# Patient Record
Sex: Male | Born: 1940
Health system: Southern US, Community
[De-identification: ages and names within clinical notes are randomized; demographics above are authoritative.]

## PROBLEM LIST (undated history)

## (undated) DIAGNOSIS — H9193 Unspecified hearing loss, bilateral: Secondary | ICD-10-CM

## (undated) DIAGNOSIS — I10 Essential (primary) hypertension: Secondary | ICD-10-CM

## (undated) HISTORY — PX: APPENDECTOMY: SHX54

## (undated) HISTORY — PX: CATARACT EXTRACTION, BILATERAL: SHX1313

## (undated) HISTORY — PX: VASECTOMY: SHX75

---

## 2008-07-10 ENCOUNTER — Encounter (INDEPENDENT_AMBULATORY_CARE_PROVIDER_SITE_OTHER): Payer: Self-pay | Admitting: Surgery

## 2008-07-10 ENCOUNTER — Encounter: Admission: RE | Admit: 2008-07-10 | Discharge: 2008-07-10 | Payer: Self-pay | Admitting: Internal Medicine

## 2008-07-10 ENCOUNTER — Ambulatory Visit (HOSPITAL_COMMUNITY): Admission: RE | Admit: 2008-07-10 | Discharge: 2008-07-12 | Payer: Self-pay | Admitting: Surgery

## 2010-08-12 LAB — DIFFERENTIAL
Basophils Absolute: 0 10*3/uL (ref 0.0–0.1)
Basophils Relative: 0 % (ref 0–1)
Eosinophils Absolute: 0 10*3/uL (ref 0.0–0.7)
Neutrophils Relative %: 87 % — ABNORMAL HIGH (ref 43–77)

## 2010-08-12 LAB — CBC
Hemoglobin: 15.2 g/dL (ref 13.0–17.0)
MCHC: 34.1 g/dL (ref 30.0–36.0)
MCHC: 34.3 g/dL (ref 30.0–36.0)
MCV: 91.1 fL (ref 78.0–100.0)
MCV: 91.9 fL (ref 78.0–100.0)
Platelets: 140 10*3/uL — ABNORMAL LOW (ref 150–400)
RBC: 4.91 MIL/uL (ref 4.22–5.81)

## 2010-08-12 LAB — BASIC METABOLIC PANEL
CO2: 27 mEq/L (ref 19–32)
Chloride: 99 mEq/L (ref 96–112)
Creatinine, Ser: 1.2 mg/dL (ref 0.4–1.5)
GFR calc Af Amer: >60 mL/min (ref >60)
Glucose, Bld: 152 mg/dL — ABNORMAL HIGH (ref 70–99)
Sodium: 133 mEq/L — ABNORMAL LOW (ref 135–145)

## 2010-09-14 NOTE — Op Note (Signed)
NAMEJAXN, Peter Stone            ACCOUNT NO.:  [id number]   MEDICAL RECORD NO.:  [id number]          PATIENT TYPE:  AMB   LOCATION:  DAY                          FACILITY:  [facility]   PHYSICIAN:  Sandria Bales. Ezzard Standing, M.D.  DATE OF BIRTH:  1940-06-03   DATE OF PROCEDURE:  DATE OF DISCHARGE:                               OPERATIVE REPORT   Date of admission ?   HISTORY OF ILLNESS:  Peter Stone is a 70 year old white male patient of  Dr. Theressa Millard who started hurting last evening about 5 p.m.  He had  increasing abdominal pain which seemed to localize in the right lower  quadrant.  He saw Dr. Earl Gala today who obtained a CT scan at [facility] and the CT scan is consistent with acute appendicitis.   He denies a history of peptic ulcer disease, liver disease, gallbladder  disease, colon disease, or any prior abdominal surgery.   PAST MEDICAL HISTORY:  He has no allergies.   MEDICATIONS:  1. Hytrin for blood pressure (generic).  2. Potassium.  3. Aspirin a day.   REVIEW OF SYSTEMS:  NEUROLOGIC:  No seizure or loss of consciousness.  PULMONARY:  No pneumonia or tuberculosis.  CARDIAC:  He has hypertension for about 20 years, but no heart attack,  chest pain,  or cardiac  evaluation.  GASTROINTESTINAL:  See history of present illness.  Of note, his mother  did have appendicitis many years ago.  UROLOGIC:  No kidney stones or kidney infections.  MUSCULOSKELETAL:  I think he has like a trick knee but otherwise had no  significant musculoskeletal problems.   SOCIAL HISTORY:  His wife is in the room with him.  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   PHYSICAL EXAMINATION:  VITAL SIGNS:  His temperature is 97.5, pulse 88,  and blood pressure 135/72.  HEENT:  Unremarkable.  NECK:  Supple.  There are no masses or thyromegaly.  LYMPH NODES:  He has no cervical or supraclavicular adenopathy.  LUNGS:  Clear to auscultation with symmetric breath  sounds.  HEART:  Regular rate and rhythm without murmur or rub.  ABDOMEN:  He is tender and guarding at his right lower quadrant.  I feel  no mass, no hernia, or organomegaly.  EXTREMITIES:  He has good strength in upper and lower extremities.  NEUROLOGIC:  Grossly intact.   LABORATORY DATA:  Labs that I have show a sodium 133, potassium 3.4,  chloride of 99, CO2 of 27, glucose of 152, creatinine is 1.2.  His white  blood count 16,500, hemoglobin 15, hematocrit 44, and platelet count  173,000.  His EKG was unremarkable.  I reviewed his CT scan which showed a dull, enlarged appendix in the  right lower quadrant, retrocecal behind the ileum.   IMPRESSION:  1. Acute appendicitis.  I discussed with the patient and wife about      proceeding with appendectomy.  I am covering for Dr. Michaell Cowing.  It is      unclear at this time whether it will be me or Dr. Michaell Cowing  will be the      surgeon.  I discussed with him the potential indications and      potential complications of an appendectomy which include, but are      not limited to, bleeding, infection, and  open surgery.  We will      try to do it laparoscopically and the diagnosis could be something      of appendicitis.   His hospitalization will depend on the severity of the appendicitis and  how well the surgery goes.  1. Hypertension.      Sandria Bales. Ezzard Standing, M.D.  Electronically Signed     DHN/MEDQ  D:  07/10/2008  T:  07/11/2008  Job:  [id number]   cc:   Theressa Millard, M.D.

## 2010-09-14 NOTE — Op Note (Signed)
NAMECREIGHTON, LONGLEY NO.:  [id number]   MEDICAL RECORD NO.:  [id number]          PATIENT TYPE:  OIB   LOCATION:  0098                         FACILITY:  [facility]   PHYSICIAN:  Sandria Bales. Ezzard Standing, M.D.  DATE OF BIRTH:  [birth year]   DATE OF PROCEDURE:  07/10/2008  DATE OF DISCHARGE:                               OPERATIVE REPORT   Date of Surgery - 10 July 2008   PREOPERATIVE DIAGNOSIS:  Appendicitis.   POSTOPERATIVE DIAGNOSIS:  Ruptured appendicitis.   PROCEDURE:  Laparoscopic appendectomy.   SURGEON:  Sandria Bales. Ezzard Standing, M.D.   FIRST ASSISTANT:  None.   ANESTHESIA:  General endotracheal with approximately 20 mL of 0.25%  Marcaine.   COMPLICATIONS:  None.   INDICATIONS FOR PROCEDURE:  Mr. Urbas is a 70 year old white male  patient of Dr. Benjaman Kindler, who developed abdominal pain, starting about  5:00 p.m. last night.  He saw Dr. Earl Gala today in the office, who  obtained a CT scan that showed changes in the appendix consistent with  acute appendicitis.   I discussed with the patient about proceeding with surgery for his  appendicitis.  I discussed both the indications and potential  complications.  Potential complications include, but not limited to,  bleeding, infection, bowel injury and open surgery.   OPERATIVE NOTE:  The patient placed in supine position.  His left arm  tucked to his side, his right arm was out and a Foley catheter in place.  His abdomen was shaved, sterilely draped and a time-out was held  identifying the patient and procedure.   I went into his abdominal cavity through an infraumbilical incision.  Sharp dissection carried down to the abdominal cavity.  A 0 degrees 10  mm laparoscope was inserted through a 12 mm Hasson trocar and Hasson  trocar secured with a 0 Vicryl suture.  A 5 mm trocar was placed the  right upper quadrant.  A 11-mm trocar was placed in the left lower  quadrant.  Abdominal exploration carried out, right and  left lobes of  liver unremarkable.  The bowel was covered in part by omentum but I  could see purulence or pus in his pelvis.  This was consistent with  ruptured appendicitis.   I found his appendix curled along his right pelvic brim.  It had  obviously perforated and he had contamination in the pelvis.  I was able  to the appendix up.  I took the mesentery down with the Harmonic scalpel  down to the base of appendix and then divided the base of the appendix  with a vascular load of the Ethicon 45 mm Endo-GIA stapler.   I then put the appendix in EndoCatch bag and delivered it through the  umbilicus.  I irrigated his abdomen out with saline.  I used about 2  liters of saline.  I again tried to irrigate a lot out of his right  pelvic brim and pelvis.   I then I removed the trocars in turn.  I closed umbilical port with a 0  Vicryl suture.  I closed the skin each port with 5-0  Monocryl suture.  I  used Dermabond at each wound and sterilely dressed it.  The patient  tolerated procedure well.  Sponge and needle count were correct at the  end of the case.      Sandria Bales. Ezzard Standing, M.D.  Electronically Signed     DHN/MEDQ  D:  07/10/2008  T:  07/11/2008  Job:  [id number]   cc:   Theressa Millard, M.D.  Fax: [phone]

## 2011-05-12 DIAGNOSIS — H33309 Unspecified retinal break, unspecified eye: Secondary | ICD-10-CM | POA: Diagnosis not present

## 2011-06-27 DIAGNOSIS — Z1331 Encounter for screening for depression: Secondary | ICD-10-CM | POA: Diagnosis not present

## 2011-06-27 DIAGNOSIS — I10 Essential (primary) hypertension: Secondary | ICD-10-CM | POA: Diagnosis not present

## 2011-06-27 DIAGNOSIS — Z0000 Encounter for general adult medical examination without abnormal findings: Secondary | ICD-10-CM | POA: Diagnosis not present

## 2011-09-23 DIAGNOSIS — Z4800 Encounter for change or removal of nonsurgical wound dressing: Secondary | ICD-10-CM | POA: Diagnosis not present

## 2011-10-12 DIAGNOSIS — H33059 Total retinal detachment, unspecified eye: Secondary | ICD-10-CM | POA: Diagnosis not present

## 2011-10-12 DIAGNOSIS — H01009 Unspecified blepharitis unspecified eye, unspecified eyelid: Secondary | ICD-10-CM | POA: Diagnosis not present

## 2011-12-26 DIAGNOSIS — I10 Essential (primary) hypertension: Secondary | ICD-10-CM | POA: Diagnosis not present

## 2012-03-28 DIAGNOSIS — D485 Neoplasm of uncertain behavior of skin: Secondary | ICD-10-CM | POA: Diagnosis not present

## 2012-04-11 DIAGNOSIS — H33059 Total retinal detachment, unspecified eye: Secondary | ICD-10-CM | POA: Diagnosis not present

## 2012-04-11 DIAGNOSIS — H5210 Myopia, unspecified eye: Secondary | ICD-10-CM | POA: Diagnosis not present

## 2012-04-11 DIAGNOSIS — Z961 Presence of intraocular lens: Secondary | ICD-10-CM | POA: Diagnosis not present

## 2012-04-11 DIAGNOSIS — H52209 Unspecified astigmatism, unspecified eye: Secondary | ICD-10-CM | POA: Diagnosis not present

## 2012-05-08 DIAGNOSIS — Z23 Encounter for immunization: Secondary | ICD-10-CM | POA: Diagnosis not present

## 2012-06-28 DIAGNOSIS — I10 Essential (primary) hypertension: Secondary | ICD-10-CM | POA: Diagnosis not present

## 2012-06-28 DIAGNOSIS — E7800 Pure hypercholesterolemia, unspecified: Secondary | ICD-10-CM | POA: Diagnosis not present

## 2012-12-05 DIAGNOSIS — I10 Essential (primary) hypertension: Secondary | ICD-10-CM | POA: Diagnosis not present

## 2012-12-05 DIAGNOSIS — E7800 Pure hypercholesterolemia, unspecified: Secondary | ICD-10-CM | POA: Diagnosis not present

## 2012-12-18 DIAGNOSIS — R51 Headache: Secondary | ICD-10-CM | POA: Diagnosis not present

## 2012-12-18 DIAGNOSIS — R634 Abnormal weight loss: Secondary | ICD-10-CM | POA: Diagnosis not present

## 2013-01-16 DIAGNOSIS — H905 Unspecified sensorineural hearing loss: Secondary | ICD-10-CM | POA: Diagnosis not present

## 2013-01-16 DIAGNOSIS — H6120 Impacted cerumen, unspecified ear: Secondary | ICD-10-CM | POA: Diagnosis not present

## 2013-02-22 DIAGNOSIS — Z23 Encounter for immunization: Secondary | ICD-10-CM | POA: Diagnosis not present

## 2013-04-16 DIAGNOSIS — H33059 Total retinal detachment, unspecified eye: Secondary | ICD-10-CM | POA: Diagnosis not present

## 2013-04-16 DIAGNOSIS — Z961 Presence of intraocular lens: Secondary | ICD-10-CM | POA: Diagnosis not present

## 2013-04-16 DIAGNOSIS — H52209 Unspecified astigmatism, unspecified eye: Secondary | ICD-10-CM | POA: Diagnosis not present

## 2013-06-11 DIAGNOSIS — Z23 Encounter for immunization: Secondary | ICD-10-CM | POA: Diagnosis not present

## 2013-06-11 DIAGNOSIS — I10 Essential (primary) hypertension: Secondary | ICD-10-CM | POA: Diagnosis not present

## 2013-12-16 DIAGNOSIS — E7800 Pure hypercholesterolemia, unspecified: Secondary | ICD-10-CM | POA: Diagnosis not present

## 2013-12-16 DIAGNOSIS — I10 Essential (primary) hypertension: Secondary | ICD-10-CM | POA: Diagnosis not present

## 2013-12-16 DIAGNOSIS — N529 Male erectile dysfunction, unspecified: Secondary | ICD-10-CM | POA: Diagnosis not present

## 2014-02-11 DIAGNOSIS — Z23 Encounter for immunization: Secondary | ICD-10-CM | POA: Diagnosis not present

## 2014-04-22 DIAGNOSIS — Z961 Presence of intraocular lens: Secondary | ICD-10-CM | POA: Diagnosis not present

## 2014-04-22 DIAGNOSIS — H33051 Total retinal detachment, right eye: Secondary | ICD-10-CM | POA: Diagnosis not present

## 2014-04-22 DIAGNOSIS — H52203 Unspecified astigmatism, bilateral: Secondary | ICD-10-CM | POA: Diagnosis not present

## 2014-06-24 DIAGNOSIS — I10 Essential (primary) hypertension: Secondary | ICD-10-CM | POA: Diagnosis not present

## 2014-06-24 DIAGNOSIS — E780 Pure hypercholesterolemia: Secondary | ICD-10-CM | POA: Diagnosis not present

## 2014-06-24 DIAGNOSIS — N529 Male erectile dysfunction, unspecified: Secondary | ICD-10-CM | POA: Diagnosis not present

## 2014-12-23 ENCOUNTER — Other Ambulatory Visit: Payer: Self-pay | Admitting: Internal Medicine

## 2015-01-08 DIAGNOSIS — E780 Pure hypercholesterolemia: Secondary | ICD-10-CM | POA: Diagnosis not present

## 2015-01-08 DIAGNOSIS — I10 Essential (primary) hypertension: Secondary | ICD-10-CM | POA: Diagnosis not present

## 2015-01-08 DIAGNOSIS — Z7189 Other specified counseling: Secondary | ICD-10-CM | POA: Diagnosis not present

## 2015-01-08 DIAGNOSIS — Z0000 Encounter for general adult medical examination without abnormal findings: Secondary | ICD-10-CM | POA: Diagnosis not present

## 2015-01-08 DIAGNOSIS — Z1389 Encounter for screening for other disorder: Secondary | ICD-10-CM | POA: Diagnosis not present

## 2015-01-08 DIAGNOSIS — Z79899 Other long term (current) drug therapy: Secondary | ICD-10-CM | POA: Diagnosis not present

## 2015-02-02 DIAGNOSIS — M79602 Pain in left arm: Secondary | ICD-10-CM | POA: Diagnosis not present

## 2015-02-17 DIAGNOSIS — Z23 Encounter for immunization: Secondary | ICD-10-CM | POA: Diagnosis not present

## 2015-02-17 DIAGNOSIS — R109 Unspecified abdominal pain: Secondary | ICD-10-CM | POA: Diagnosis not present

## 2015-02-17 DIAGNOSIS — M25529 Pain in unspecified elbow: Secondary | ICD-10-CM | POA: Diagnosis not present

## 2015-02-23 ENCOUNTER — Ambulatory Visit
Admission: RE | Admit: 2015-02-23 | Discharge: 2015-02-23 | Disposition: A | Discharge status: Home / Self Care | Payer: Medicare Other | Source: Ambulatory Visit | Attending: Internal Medicine | Admitting: Internal Medicine

## 2015-02-23 ENCOUNTER — Other Ambulatory Visit: Payer: Self-pay | Admitting: Internal Medicine

## 2015-02-23 DIAGNOSIS — M542 Cervicalgia: Secondary | ICD-10-CM | POA: Diagnosis not present

## 2015-02-23 DIAGNOSIS — M79602 Pain in left arm: Secondary | ICD-10-CM | POA: Diagnosis not present

## 2015-02-23 DIAGNOSIS — M5030 Other cervical disc degeneration, unspecified cervical region: Secondary | ICD-10-CM | POA: Diagnosis not present

## 2015-02-26 DIAGNOSIS — M79602 Pain in left arm: Secondary | ICD-10-CM | POA: Diagnosis not present

## 2015-02-27 ENCOUNTER — Ambulatory Visit
Admission: RE | Admit: 2015-02-27 | Discharge: 2015-02-27 | Disposition: A | Discharge status: Home / Self Care | Payer: Medicare Other | Source: Ambulatory Visit | Attending: Internal Medicine | Admitting: Internal Medicine

## 2015-02-27 ENCOUNTER — Other Ambulatory Visit: Payer: Self-pay | Admitting: Internal Medicine

## 2015-02-27 DIAGNOSIS — R109 Unspecified abdominal pain: Secondary | ICD-10-CM

## 2015-03-06 DIAGNOSIS — R109 Unspecified abdominal pain: Secondary | ICD-10-CM | POA: Diagnosis not present

## 2015-03-13 DIAGNOSIS — H6123 Impacted cerumen, bilateral: Secondary | ICD-10-CM | POA: Diagnosis not present

## 2015-06-08 DIAGNOSIS — Z961 Presence of intraocular lens: Secondary | ICD-10-CM | POA: Diagnosis not present

## 2015-06-08 DIAGNOSIS — H52203 Unspecified astigmatism, bilateral: Secondary | ICD-10-CM | POA: Diagnosis not present

## 2015-07-08 DIAGNOSIS — Z79899 Other long term (current) drug therapy: Secondary | ICD-10-CM | POA: Diagnosis not present

## 2015-07-08 DIAGNOSIS — I10 Essential (primary) hypertension: Secondary | ICD-10-CM | POA: Diagnosis not present

## 2015-12-21 ENCOUNTER — Other Ambulatory Visit: Payer: Self-pay | Admitting: *Deleted

## 2015-12-21 NOTE — Telephone Encounter (Signed)
Opened in error

## 2016-01-13 DIAGNOSIS — E7800 Pure hypercholesterolemia, unspecified: Secondary | ICD-10-CM | POA: Diagnosis not present

## 2016-01-13 DIAGNOSIS — Z79899 Other long term (current) drug therapy: Secondary | ICD-10-CM | POA: Diagnosis not present

## 2016-01-13 DIAGNOSIS — Z23 Encounter for immunization: Secondary | ICD-10-CM | POA: Diagnosis not present

## 2016-01-13 DIAGNOSIS — Z0000 Encounter for general adult medical examination without abnormal findings: Secondary | ICD-10-CM | POA: Diagnosis not present

## 2016-01-13 DIAGNOSIS — I10 Essential (primary) hypertension: Secondary | ICD-10-CM | POA: Diagnosis not present

## 2016-01-13 DIAGNOSIS — Z1389 Encounter for screening for other disorder: Secondary | ICD-10-CM | POA: Diagnosis not present

## 2016-01-21 ENCOUNTER — Other Ambulatory Visit: Payer: Self-pay | Admitting: Gastroenterology

## 2016-03-01 ENCOUNTER — Encounter (HOSPITAL_COMMUNITY): Payer: Self-pay | Admitting: *Deleted

## 2016-03-07 ENCOUNTER — Ambulatory Visit (HOSPITAL_COMMUNITY): Payer: Medicare Other | Admitting: Anesthesiology

## 2016-03-07 ENCOUNTER — Ambulatory Visit (HOSPITAL_COMMUNITY)
Admission: RE | Admit: 2016-03-07 | Discharge: 2016-03-07 | Disposition: A | Discharge status: Home / Self Care | Payer: Medicare Other | Source: Ambulatory Visit | Attending: Gastroenterology | Admitting: Gastroenterology

## 2016-03-07 ENCOUNTER — Encounter (HOSPITAL_COMMUNITY): Payer: Self-pay

## 2016-03-07 ENCOUNTER — Encounter (HOSPITAL_COMMUNITY)
Admission: RE | Disposition: A | Discharge status: Home / Self Care | Payer: Self-pay | Source: Ambulatory Visit | Attending: Gastroenterology

## 2016-03-07 DIAGNOSIS — Z1211 Encounter for screening for malignant neoplasm of colon: Secondary | ICD-10-CM | POA: Insufficient documentation

## 2016-03-07 DIAGNOSIS — D127 Benign neoplasm of rectosigmoid junction: Secondary | ICD-10-CM | POA: Diagnosis not present

## 2016-03-07 DIAGNOSIS — I10 Essential (primary) hypertension: Secondary | ICD-10-CM | POA: Diagnosis not present

## 2016-03-07 DIAGNOSIS — K5730 Diverticulosis of large intestine without perforation or abscess without bleeding: Secondary | ICD-10-CM | POA: Insufficient documentation

## 2016-03-07 DIAGNOSIS — D125 Benign neoplasm of sigmoid colon: Secondary | ICD-10-CM | POA: Insufficient documentation

## 2016-03-07 DIAGNOSIS — Z79899 Other long term (current) drug therapy: Secondary | ICD-10-CM | POA: Diagnosis not present

## 2016-03-07 DIAGNOSIS — E7800 Pure hypercholesterolemia, unspecified: Secondary | ICD-10-CM | POA: Diagnosis not present

## 2016-03-07 HISTORY — DX: Essential (primary) hypertension: I10

## 2016-03-07 HISTORY — DX: Unspecified hearing loss, bilateral: H91.93

## 2016-03-07 HISTORY — PX: COLONOSCOPY WITH PROPOFOL: SHX5780

## 2016-03-07 SURGERY — COLONOSCOPY WITH PROPOFOL
Anesthesia: Monitor Anesthesia Care

## 2016-03-07 MED ORDER — SODIUM CHLORIDE 0.9 % IV SOLN: INTRAVENOUS | Status: DC

## 2016-03-07 MED ORDER — LIDOCAINE 2% (20 MG/ML) 5 ML SYRINGE
INTRAMUSCULAR | Status: AC
  Filled 2016-03-07: qty 5

## 2016-03-07 MED ORDER — LACTATED RINGERS IV SOLN
INTRAVENOUS | Status: DC
  Administered 2016-03-07: 08:00:00 via INTRAVENOUS

## 2016-03-07 MED ORDER — PROPOFOL 500 MG/50ML IV EMUL
INTRAVENOUS | Status: DC | PRN
  Administered 2016-03-07: 100 ug/kg/min via INTRAVENOUS

## 2016-03-07 MED ORDER — PROPOFOL 10 MG/ML IV BOLUS
INTRAVENOUS | Status: DC | PRN
  Administered 2016-03-07: 20 mg via INTRAVENOUS
  Administered 2016-03-07 (×2): 10 mg via INTRAVENOUS
  Administered 2016-03-07: 50 mg via INTRAVENOUS
  Administered 2016-03-07 (×3): 20 mg via INTRAVENOUS

## 2016-03-07 MED ORDER — KETAMINE HCL 10 MG/ML IJ SOLN
INTRAMUSCULAR | Status: DC | PRN
  Administered 2016-03-07: 10 mg/kg/h via INTRAVENOUS

## 2016-03-07 MED ORDER — LIDOCAINE 2% (20 MG/ML) 5 ML SYRINGE
INTRAMUSCULAR | Status: DC | PRN
  Administered 2016-03-07: 50 mg via INTRAVENOUS

## 2016-03-07 MED ORDER — PROPOFOL 10 MG/ML IV BOLUS
INTRAVENOUS | Status: AC
  Filled 2016-03-07: qty 60

## 2016-03-07 MED FILL — Ketamine HCl Inj 10 MG/ML: INTRAMUSCULAR | Qty: 7.94 | Status: AC

## 2016-03-07 SURGICAL SUPPLY — 21 items

## 2016-03-07 NOTE — Anesthesia Postprocedure Evaluation (Signed)
Anesthesia Post Note  Patient: Peter Stone  Procedure(s) Performed: Procedure(s) (LRB): COLONOSCOPY WITH PROPOFOL (N/A)  Patient location during evaluation: Endoscopy Anesthesia Type: MAC Level of consciousness: awake Pain management: pain level controlled Vital Signs Assessment: post-procedure vital signs reviewed and stable Respiratory status: spontaneous breathing Cardiovascular status: stable Postop Assessment: no signs of nausea or vomiting Anesthetic complications: no     Last Vitals:  Vitals:   03/07/16 0920 03/07/16 0930  BP: (!) 133/102 (!) 144/67  Pulse: 81 78  Resp: (!) 24 18  Temp:      Last Pain:  Vitals:   03/07/16 0910  TempSrc: Axillary   Pain Goal:                 Madelyne Millikan JR,JOHN Shonta Phillis

## 2016-03-07 NOTE — Op Note (Addendum)
[facility] Patient Name: Peter Stone Procedure Date: 03/07/2016 MRN: [id number] Attending MD: Garlan Fair , MD Date of Birth: Nov 12, 1940 CSN: [id number] Age: 75 Admit Type: Outpatient Procedure:                Colonoscopy Indications:              Screening for colorectal malignant neoplasm Providers:                Garlan Fair, MD, Cleda Daub, RN, [facility], Technician, Heide Scales, CRNA Referring MD:              Medicines:                Propofol per Anesthesia Complications:            No immediate complications. Estimated Blood Loss:     Estimated blood loss: none. Procedure:                Pre-Anesthesia Assessment:                           - Prior to the procedure, a History and Physical                            was performed, and patient medications and                            allergies were reviewed. The patient's tolerance of                            previous anesthesia was also reviewed. The risks                            and benefits of the procedure and the sedation                            options and risks were discussed with the patient.                            All questions were answered, and informed consent                            was obtained. Prior Anticoagulants: The patient has                            taken aspirin, last dose was 3 days prior to                            procedure. ASA Grade Assessment: III - A patient                            with severe systemic disease. After reviewing the  risks and benefits, the patient was deemed in                            satisfactory condition to undergo the procedure.                           After obtaining informed consent, the colonoscope                            was passed under direct vision. Throughout the                            procedure, the patient's blood pressure, pulse, and                          oxygen saturations were monitored continuously. The                            EC-[id number]) scope was introduced through                            the anus and advanced to the the cecum, identified                            by appendiceal orifice and ileocecal valve. The                            colonoscopy was performed without difficulty. The                            patient tolerated the procedure well. The quality                            of the bowel preparation was adequate. The                            appendiceal orifice and the rectum were                            photographed. Scope In: 8:31:33 AM Scope Out: 9:07:47 AM Scope Withdrawal Time: 0 hours 28 minutes 0 seconds  Total Procedure Duration: 0 hours 36 minutes 14 seconds  Findings:      The perianal and digital rectal examinations were normal.      A 8 mm polyp was found in the recto-sigmoid colon. The polyp was       pedunculated. The polyp was removed with a hot snare. Resection and       retrieval were complete.      A 4 mm polyp was found in the recto-sigmoid colon. The polyp was       sessile. The polyp was removed with a cold snare. Resection and       retrieval were complete.      Multiple small and large-mouthed diverticula were found in the sigmoid       colon.      The exam was otherwise without abnormality.  Impression:               - One 8 mm polyp at the recto-sigmoid colon,                            removed with a hot snare. Resected and retrieved.                           - One 4 mm polyp at the recto-sigmoid colon,                            removed with a cold snare. Resected and retrieved.                           - Diverticulosis in the sigmoid colon.                           - The examination was otherwise normal. Moderate Sedation:      N/A- Per Anesthesia Care Recommendation:           - Patient has a contact number available for                             emergencies. The signs and symptoms of potential                            delayed complications were discussed with the                            patient. Return to normal activities tomorrow.                            Written discharge instructions were provided to the                            patient.                           - Repeat colonoscopy date to be determined after                            pending pathology results are reviewed for                            surveillance.                           - Resume previous diet.                           - Continue present medications. Procedure Code(s):        --- Professional ---                           [phone], Colonoscopy, flexible; with removal of  tumor(s), polyp(s), or other lesion(s) by snare                            technique Diagnosis Code(s):        --- Professional ---                           Z12.11, Encounter for screening for malignant                            neoplasm of colon                           D12.7, Benign neoplasm of rectosigmoid junction                           K57.30, Diverticulosis of large intestine without                            perforation or abscess without bleeding CPT copyright 2016 American Medical Association. All rights reserved. The codes documented in this report are preliminary and upon coder review may  be revised to meet current compliance requirements. Earle Gell, MD Garlan Fair, MD 03/07/2016 9:11:01 AM This report has been signed electronically. Number of Addenda: 0

## 2016-03-07 NOTE — Addendum Note (Signed)
Addendum  created 03/07/16 1034 by Deliah Boston, CRNA   Anesthesia Intra Meds edited

## 2016-03-07 NOTE — Addendum Note (Signed)
Addendum  created 03/07/16 1323 by Deliah Boston, CRNA   Anesthesia Intra Meds edited

## 2016-03-07 NOTE — Discharge Instructions (Signed)

## 2016-03-07 NOTE — H&P (Signed)
Procedure: Screening colonoscopy. Normal screening colonoscopy was performed on 02/02/2006  History: The patient is a 75 year old male born [birth year]. He is scheduled to undergo a repeat screening colonoscopy today.  Past medical history: Cataract surgery. Appendectomy. Hypertension. Hypercholesterolemia.  Medication allergies: None  Exam: The patient is alert and lying comfortably on the endoscopy stretcher. Abdomen is soft and nontender to palpation. Lungs are clear to auscultation. Cardiac exam reveals a regular rhythm.  Plan: Proceed with screening colonoscopy

## 2016-03-07 NOTE — Transfer of Care (Signed)
Immediate Anesthesia Transfer of Care Note  Patient: Peter Stone  Procedure(s) Performed: Procedure(s): COLONOSCOPY WITH PROPOFOL (N/A)  Patient Location: PACU  Anesthesia Type:MAC  Level of Consciousness: Patient easily awoken, sedated, comfortable, cooperative, following commands, responds to stimulation.   Airway & Oxygen Therapy: Patient spontaneously breathing, ventilating well, oxygen via simple oxygen mask.  Post-op Assessment: Report given to PACU RN, vital signs reviewed and stable, moving all extremities.   Post vital signs: Reviewed and stable.  Complications: No apparent anesthesia complications  Last Vitals:  Vitals:   03/07/16 0747 03/07/16 0910  BP: (!) 182/90 (!) 146/94  Pulse: 88 91  Resp: 13 (!) 24  Temp: 36.8 C     Last Pain:  Vitals:   03/07/16 0747  TempSrc: Oral         Complications: No apparent anesthesia complications

## 2016-03-07 NOTE — Addendum Note (Signed)
Addendum  created 03/07/16 1133 by Lyn Hollingshead, MD   Anesthesia Review and Sign - Ready for Procedure, Sign clinical note

## 2016-03-07 NOTE — Anesthesia Preprocedure Evaluation (Addendum)
Anesthesia Evaluation  Patient identified by MRN, date of birth, ID band Patient awake    Reviewed: Allergy & Precautions, NPO status , Patient's Chart, lab work & pertinent test results  Airway Mallampati: I       Dental no notable dental hx.    Pulmonary neg pulmonary ROS,    Pulmonary exam normal        Cardiovascular hypertension, Pt. on medications Normal cardiovascular exam     Neuro/Psych negative neurological ROS  negative psych ROS   GI/Hepatic negative GI ROS, Neg liver ROS,   Endo/Other  negative endocrine ROS  Renal/GU negative Renal ROS  negative genitourinary   Musculoskeletal negative musculoskeletal ROS (+)   Abdominal Normal abdominal exam  (+)   Peds negative pediatric ROS (+)  Hematology negative hematology ROS (+)   Anesthesia Other Findings   Reproductive/Obstetrics negative OB ROS                             Anesthesia Physical Anesthesia Plan  ASA: II  Anesthesia Plan: MAC   Post-op Pain Management:    Induction: Intravenous  Airway Management Planned: Nasal Cannula and Simple Face Mask  Additional Equipment:   Intra-op Plan:   Post-operative Plan:   Informed Consent: I have reviewed the patients History and Physical, chart, labs and discussed the procedure including the risks, benefits and alternatives for the proposed anesthesia with the patient or authorized representative who has indicated his/her understanding and acceptance.     Plan Discussed with: CRNA and Surgeon  Anesthesia Plan Comments:         Anesthesia Quick Evaluation

## 2016-03-08 ENCOUNTER — Encounter (HOSPITAL_COMMUNITY): Payer: Self-pay | Admitting: Gastroenterology

## 2016-06-20 DIAGNOSIS — Z961 Presence of intraocular lens: Secondary | ICD-10-CM | POA: Diagnosis not present

## 2016-06-20 DIAGNOSIS — H35371 Puckering of macula, right eye: Secondary | ICD-10-CM | POA: Diagnosis not present

## 2016-06-20 DIAGNOSIS — H52203 Unspecified astigmatism, bilateral: Secondary | ICD-10-CM | POA: Diagnosis not present

## 2016-07-15 DIAGNOSIS — I10 Essential (primary) hypertension: Secondary | ICD-10-CM | POA: Diagnosis not present

## 2016-12-29 DIAGNOSIS — R0681 Apnea, not elsewhere classified: Secondary | ICD-10-CM | POA: Diagnosis not present

## 2016-12-29 DIAGNOSIS — G4719 Other hypersomnia: Secondary | ICD-10-CM | POA: Diagnosis not present

## 2017-01-17 DIAGNOSIS — E7800 Pure hypercholesterolemia, unspecified: Secondary | ICD-10-CM | POA: Diagnosis not present

## 2017-01-17 DIAGNOSIS — I10 Essential (primary) hypertension: Secondary | ICD-10-CM | POA: Diagnosis not present

## 2017-01-17 DIAGNOSIS — Z79899 Other long term (current) drug therapy: Secondary | ICD-10-CM | POA: Diagnosis not present

## 2017-01-17 DIAGNOSIS — Z1389 Encounter for screening for other disorder: Secondary | ICD-10-CM | POA: Diagnosis not present

## 2017-01-17 DIAGNOSIS — G4733 Obstructive sleep apnea (adult) (pediatric): Secondary | ICD-10-CM | POA: Diagnosis not present

## 2017-01-17 DIAGNOSIS — Z23 Encounter for immunization: Secondary | ICD-10-CM | POA: Diagnosis not present

## 2017-01-17 DIAGNOSIS — Z0000 Encounter for general adult medical examination without abnormal findings: Secondary | ICD-10-CM | POA: Diagnosis not present

## 2017-01-18 DIAGNOSIS — G4733 Obstructive sleep apnea (adult) (pediatric): Secondary | ICD-10-CM | POA: Diagnosis not present

## 2017-04-04 DIAGNOSIS — G4733 Obstructive sleep apnea (adult) (pediatric): Secondary | ICD-10-CM | POA: Diagnosis not present

## 2017-07-18 DIAGNOSIS — Z79899 Other long term (current) drug therapy: Secondary | ICD-10-CM | POA: Diagnosis not present

## 2017-07-18 DIAGNOSIS — I10 Essential (primary) hypertension: Secondary | ICD-10-CM | POA: Diagnosis not present

## 2017-07-18 DIAGNOSIS — E7800 Pure hypercholesterolemia, unspecified: Secondary | ICD-10-CM | POA: Diagnosis not present

## 2017-08-21 DIAGNOSIS — I10 Essential (primary) hypertension: Secondary | ICD-10-CM | POA: Diagnosis not present

## 2017-08-21 DIAGNOSIS — Z79899 Other long term (current) drug therapy: Secondary | ICD-10-CM | POA: Diagnosis not present

## 2017-10-06 DIAGNOSIS — H35371 Puckering of macula, right eye: Secondary | ICD-10-CM | POA: Diagnosis not present

## 2017-10-06 DIAGNOSIS — H52203 Unspecified astigmatism, bilateral: Secondary | ICD-10-CM | POA: Diagnosis not present

## 2017-10-06 DIAGNOSIS — Z961 Presence of intraocular lens: Secondary | ICD-10-CM | POA: Diagnosis not present

## 2017-12-14 DIAGNOSIS — H26492 Other secondary cataract, left eye: Secondary | ICD-10-CM | POA: Diagnosis not present

## 2018-01-09 DIAGNOSIS — S90424A Blister (nonthermal), right lesser toe(s), initial encounter: Secondary | ICD-10-CM | POA: Diagnosis not present

## 2018-01-09 DIAGNOSIS — L989 Disorder of the skin and subcutaneous tissue, unspecified: Secondary | ICD-10-CM | POA: Diagnosis not present

## 2018-01-24 DIAGNOSIS — Z23 Encounter for immunization: Secondary | ICD-10-CM | POA: Diagnosis not present

## 2018-02-23 DIAGNOSIS — I10 Essential (primary) hypertension: Secondary | ICD-10-CM | POA: Diagnosis not present

## 2018-02-23 DIAGNOSIS — Z0000 Encounter for general adult medical examination without abnormal findings: Secondary | ICD-10-CM | POA: Diagnosis not present

## 2018-02-23 DIAGNOSIS — Z7189 Other specified counseling: Secondary | ICD-10-CM | POA: Diagnosis not present

## 2018-02-23 DIAGNOSIS — Z1389 Encounter for screening for other disorder: Secondary | ICD-10-CM | POA: Diagnosis not present

## 2018-02-23 DIAGNOSIS — Z79899 Other long term (current) drug therapy: Secondary | ICD-10-CM | POA: Diagnosis not present

## 2018-04-04 DIAGNOSIS — G4733 Obstructive sleep apnea (adult) (pediatric): Secondary | ICD-10-CM | POA: Diagnosis not present

## 2018-07-13 DIAGNOSIS — H6123 Impacted cerumen, bilateral: Secondary | ICD-10-CM | POA: Diagnosis not present

## 2018-09-21 ENCOUNTER — Other Ambulatory Visit: Payer: Self-pay

## 2018-09-21 ENCOUNTER — Encounter (HOSPITAL_COMMUNITY): Payer: Self-pay

## 2018-09-21 ENCOUNTER — Ambulatory Visit (HOSPITAL_COMMUNITY)
Admission: EM | Admit: 2018-09-21 | Discharge: 2018-09-21 | Disposition: A | Discharge status: Home / Self Care | Payer: Medicare Other | Attending: Internal Medicine | Admitting: Internal Medicine

## 2018-09-21 DIAGNOSIS — S01312A Laceration without foreign body of left ear, initial encounter: Secondary | ICD-10-CM

## 2018-09-21 DIAGNOSIS — W01198A Fall on same level from slipping, tripping and stumbling with subsequent striking against other object, initial encounter: Secondary | ICD-10-CM | POA: Diagnosis not present

## 2018-09-21 NOTE — Discharge Instructions (Signed)
Cleanse wound daily with soap and water.  Avoid touching and keep covered to keep clean.  Ok to use your hearing aid.  If develop increased pain, redness, drainage or otherwise worsening please return to be seen.

## 2018-09-21 NOTE — ED Triage Notes (Signed)
Pt presents with laceration to left ear after a fall  Last night and hitting his ear on furniture.

## 2018-09-21 NOTE — ED Provider Notes (Signed)
[facility]    CSN: [id number] Arrival date & time: 09/21/18  1559     History   Chief Complaint Chief Complaint  Patient presents with  . Laceration    HPI Peter Stone is a 78 y.o. male.   Peter Stone presents with complaints of laceration to left ear after a fall last night. He tripped and fell, his ear striking a table. Bleeding controlled. No pain. States he is concerned about using his hearing aid to the ear therefore seeks evaluation. No headache. Denies any loss of consciousness. No nausea or dizziness. No other injury. No change to hearing. States he has had a tdap in the past 5 years. He is not on a blood thinner.     ROS per HPI, negative if not otherwise mentioned.      Past Medical History:  Diagnosis Date  . Hearing impaired person, bilateral    wears hearing aids  . Hypertension     There are no active problems to display for this patient.   Past Surgical History:  Procedure Laterality Date  . APPENDECTOMY     laparoscopic  . CATARACT EXTRACTION, BILATERAL    . COLONOSCOPY WITH PROPOFOL N/A 03/07/2016   Procedure: COLONOSCOPY WITH PROPOFOL;  Surgeon: Garlan Fair, MD;  Location: [facility] ENDOSCOPY;  Service: Endoscopy;  Laterality: N/A;  . VASECTOMY         Home Medications    Prior to Admission medications   Medication Sig Start Date End Date Taking? Authorizing Provider  hydrochlorothiazide (HYDRODIURIL) 25 MG tablet Take 25 mg by mouth at bedtime.    [provider]  potassium chloride SA (K-DUR,KLOR-CON) 20 MEQ tablet Take 20 mEq by mouth at bedtime.    [provider]  povidone-iodine (BETADINE) 7.5 % SOLN Apply 1 application topically as needed for wound care. Error in charting-delete med    [provider]    Family History History reviewed. No pertinent family history.  Social History Social History   Tobacco Use  . Smoking status: Never Smoker  . Smokeless tobacco: Never Used   Substance Use Topics  . Alcohol use: No  . Drug use: No     Allergies   Patient has no known allergies.   Review of Systems Review of Systems   Physical Exam Triage Vital Signs ED Triage Vitals  Enc Vitals Group     BP 09/21/18 1623 (!) 149/63     Pulse Rate 09/21/18 1623 68     Resp 09/21/18 1623 17     Temp 09/21/18 1623 98.4 F (36.9 C)     Temp Source 09/21/18 1623 Oral     SpO2 09/21/18 1623 97 %     Weight --      Height --      Head Circumference --      Peak Flow --      Pain Score 09/21/18 1624 0     Pain Loc --      Pain Edu? --      Excl. in [place]? --    No data found.  Updated Vital Signs BP (!) 149/63 (BP Location: Left Arm)   Pulse 68   Temp 98.4 F (36.9 C) (Oral)   Resp 17   SpO2 97%    Physical Exam Constitutional:      Appearance: Normal appearance. He is well-developed.  HENT:     Left Ear: Laceration present.     Ears:      Comments:  Skin flap to antihelix of left ear; already adhering/ healing, unable to pull it further over open region; no active bleeding; canal WNL; see photo of wound; cleansed and dressed  Cardiovascular:     Rate and Rhythm: Normal rate and regular rhythm.  Pulmonary:     Effort: Pulmonary effort is normal.     Breath sounds: Normal breath sounds.  Skin:    General: Skin is warm and dry.  Neurological:     General: No focal deficit present.     Mental Status: He is alert and oriented to person, place, and time.        UC Treatments / Results  Labs (all labs ordered are listed, but only abnormal results are displayed) Labs Reviewed - No data to display  EKG None  Radiology No results found.  Procedures Procedures (including critical care time)  Medications Ordered in UC Medications - No data to display  Initial Impression / Assessment and Plan / UC Course  I have reviewed the triage vital signs and the nursing notes.  Pertinent labs & imaging results that were available during my care of  the patient were reviewed by me and considered in my medical decision making (see chart for details).     Skin already adhering, unable to further pull skin flap closed. Wound cleansed and dressed. Canal wnl, ok to use hearing aids. Wound care discussed. Return precautions provided. Patient verbalized understanding and agreeable to plan.   Final Clinical Impressions(s) / UC Diagnoses   Final diagnoses:  Laceration of auricle of left ear, initial encounter     Discharge Instructions     Cleanse wound daily with soap and water.  Avoid touching and keep covered to keep clean.  Ok to use your hearing aid.  If develop increased pain, redness, drainage or otherwise worsening please return to be seen.     ED Prescriptions    None     Controlled Substance Prescriptions [place] Controlled Substance Registry consulted? Not Applicable   Zigmund Gottron, NP 09/21/18 [phone]

## 2018-10-02 DIAGNOSIS — H52203 Unspecified astigmatism, bilateral: Secondary | ICD-10-CM | POA: Diagnosis not present

## 2018-10-02 DIAGNOSIS — Z961 Presence of intraocular lens: Secondary | ICD-10-CM | POA: Diagnosis not present

## 2018-10-02 DIAGNOSIS — H35373 Puckering of macula, bilateral: Secondary | ICD-10-CM | POA: Diagnosis not present

## 2018-10-02 DIAGNOSIS — I10 Essential (primary) hypertension: Secondary | ICD-10-CM | POA: Diagnosis not present

## 2019-02-14 DIAGNOSIS — Z23 Encounter for immunization: Secondary | ICD-10-CM | POA: Diagnosis not present

## 2019-03-08 DIAGNOSIS — G4733 Obstructive sleep apnea (adult) (pediatric): Secondary | ICD-10-CM | POA: Diagnosis not present

## 2019-03-08 DIAGNOSIS — I10 Essential (primary) hypertension: Secondary | ICD-10-CM | POA: Diagnosis not present

## 2019-03-08 DIAGNOSIS — Z0000 Encounter for general adult medical examination without abnormal findings: Secondary | ICD-10-CM | POA: Diagnosis not present

## 2019-03-08 DIAGNOSIS — E7800 Pure hypercholesterolemia, unspecified: Secondary | ICD-10-CM | POA: Diagnosis not present

## 2019-03-08 DIAGNOSIS — Z1389 Encounter for screening for other disorder: Secondary | ICD-10-CM | POA: Diagnosis not present

## 2019-03-08 DIAGNOSIS — Z79899 Other long term (current) drug therapy: Secondary | ICD-10-CM | POA: Diagnosis not present

## 2019-11-12 DIAGNOSIS — I10 Essential (primary) hypertension: Secondary | ICD-10-CM | POA: Diagnosis not present

## 2019-11-12 DIAGNOSIS — Z79899 Other long term (current) drug therapy: Secondary | ICD-10-CM | POA: Diagnosis not present

## 2019-11-12 DIAGNOSIS — Z23 Encounter for immunization: Secondary | ICD-10-CM | POA: Diagnosis not present

## 2020-03-09 DIAGNOSIS — Z0000 Encounter for general adult medical examination without abnormal findings: Secondary | ICD-10-CM | POA: Diagnosis not present

## 2020-03-09 DIAGNOSIS — G4733 Obstructive sleep apnea (adult) (pediatric): Secondary | ICD-10-CM | POA: Diagnosis not present

## 2020-03-09 DIAGNOSIS — Z79899 Other long term (current) drug therapy: Secondary | ICD-10-CM | POA: Diagnosis not present

## 2020-03-09 DIAGNOSIS — I10 Essential (primary) hypertension: Secondary | ICD-10-CM | POA: Diagnosis not present

## 2020-03-09 DIAGNOSIS — E7800 Pure hypercholesterolemia, unspecified: Secondary | ICD-10-CM | POA: Diagnosis not present

## 2020-04-20 DIAGNOSIS — G4733 Obstructive sleep apnea (adult) (pediatric): Secondary | ICD-10-CM | POA: Diagnosis not present

## 2020-09-15 DIAGNOSIS — Z79899 Other long term (current) drug therapy: Secondary | ICD-10-CM | POA: Diagnosis not present

## 2020-09-15 DIAGNOSIS — I10 Essential (primary) hypertension: Secondary | ICD-10-CM | POA: Diagnosis not present

## 2020-09-15 DIAGNOSIS — H903 Sensorineural hearing loss, bilateral: Secondary | ICD-10-CM | POA: Diagnosis not present

## 2020-09-17 DIAGNOSIS — H6123 Impacted cerumen, bilateral: Secondary | ICD-10-CM | POA: Diagnosis not present

## 2020-10-20 DIAGNOSIS — G4733 Obstructive sleep apnea (adult) (pediatric): Secondary | ICD-10-CM | POA: Diagnosis not present

## 2021-01-22 DIAGNOSIS — G4733 Obstructive sleep apnea (adult) (pediatric): Secondary | ICD-10-CM | POA: Diagnosis not present

## 2021-02-10 DIAGNOSIS — H903 Sensorineural hearing loss, bilateral: Secondary | ICD-10-CM | POA: Diagnosis not present

## 2021-03-03 DIAGNOSIS — H9313 Tinnitus, bilateral: Secondary | ICD-10-CM | POA: Diagnosis not present

## 2021-03-03 DIAGNOSIS — H903 Sensorineural hearing loss, bilateral: Secondary | ICD-10-CM | POA: Diagnosis not present

## 2021-03-10 ENCOUNTER — Other Ambulatory Visit: Payer: Self-pay | Admitting: Otolaryngology

## 2021-03-10 DIAGNOSIS — H903 Sensorineural hearing loss, bilateral: Secondary | ICD-10-CM

## 2021-03-17 DIAGNOSIS — Z79899 Other long term (current) drug therapy: Secondary | ICD-10-CM | POA: Diagnosis not present

## 2021-03-17 DIAGNOSIS — G4733 Obstructive sleep apnea (adult) (pediatric): Secondary | ICD-10-CM | POA: Diagnosis not present

## 2021-03-17 DIAGNOSIS — E7800 Pure hypercholesterolemia, unspecified: Secondary | ICD-10-CM | POA: Diagnosis not present

## 2021-03-17 DIAGNOSIS — I10 Essential (primary) hypertension: Secondary | ICD-10-CM | POA: Diagnosis not present

## 2021-03-17 DIAGNOSIS — Z0000 Encounter for general adult medical examination without abnormal findings: Secondary | ICD-10-CM | POA: Diagnosis not present

## 2021-03-30 DIAGNOSIS — H35373 Puckering of macula, bilateral: Secondary | ICD-10-CM | POA: Diagnosis not present

## 2021-03-30 DIAGNOSIS — H52203 Unspecified astigmatism, bilateral: Secondary | ICD-10-CM | POA: Diagnosis not present

## 2021-03-30 DIAGNOSIS — Z961 Presence of intraocular lens: Secondary | ICD-10-CM | POA: Diagnosis not present

## 2021-04-07 ENCOUNTER — Ambulatory Visit
Admission: RE | Admit: 2021-04-07 | Discharge: 2021-04-07 | Disposition: A | Discharge status: Home / Self Care | Payer: Medicare PPO | Source: Ambulatory Visit | Attending: Otolaryngology | Admitting: Otolaryngology

## 2021-04-07 ENCOUNTER — Other Ambulatory Visit: Payer: Self-pay

## 2021-04-07 DIAGNOSIS — I6782 Cerebral ischemia: Secondary | ICD-10-CM | POA: Diagnosis not present

## 2021-04-07 DIAGNOSIS — H903 Sensorineural hearing loss, bilateral: Secondary | ICD-10-CM

## 2021-04-07 DIAGNOSIS — H905 Unspecified sensorineural hearing loss: Secondary | ICD-10-CM | POA: Diagnosis not present

## 2021-04-07 MED ORDER — GADOBENATE DIMEGLUMINE 529 MG/ML IV SOLN
16.0000 mL | Freq: Once | INTRAVENOUS | Status: AC | PRN
  Administered 2021-04-07: 16 mL via INTRAVENOUS

## 2021-04-20 DIAGNOSIS — G4733 Obstructive sleep apnea (adult) (pediatric): Secondary | ICD-10-CM | POA: Diagnosis not present

## 2021-09-14 DIAGNOSIS — Z79899 Other long term (current) drug therapy: Secondary | ICD-10-CM | POA: Diagnosis not present

## 2021-09-14 DIAGNOSIS — I10 Essential (primary) hypertension: Secondary | ICD-10-CM | POA: Diagnosis not present

## 2022-03-18 DIAGNOSIS — Z79899 Other long term (current) drug therapy: Secondary | ICD-10-CM | POA: Diagnosis not present

## 2022-03-18 DIAGNOSIS — E7800 Pure hypercholesterolemia, unspecified: Secondary | ICD-10-CM | POA: Diagnosis not present

## 2022-03-18 DIAGNOSIS — G4733 Obstructive sleep apnea (adult) (pediatric): Secondary | ICD-10-CM | POA: Diagnosis not present

## 2022-03-18 DIAGNOSIS — Z0000 Encounter for general adult medical examination without abnormal findings: Secondary | ICD-10-CM | POA: Diagnosis not present

## 2022-03-18 DIAGNOSIS — Z23 Encounter for immunization: Secondary | ICD-10-CM | POA: Diagnosis not present

## 2022-03-18 DIAGNOSIS — I10 Essential (primary) hypertension: Secondary | ICD-10-CM | POA: Diagnosis not present

## 2022-04-13 DIAGNOSIS — H35373 Puckering of macula, bilateral: Secondary | ICD-10-CM | POA: Diagnosis not present

## 2022-04-13 DIAGNOSIS — Z961 Presence of intraocular lens: Secondary | ICD-10-CM | POA: Diagnosis not present

## 2022-04-13 DIAGNOSIS — H52203 Unspecified astigmatism, bilateral: Secondary | ICD-10-CM | POA: Diagnosis not present

## 2022-04-13 DIAGNOSIS — H26492 Other secondary cataract, left eye: Secondary | ICD-10-CM | POA: Diagnosis not present

## 2022-04-19 DIAGNOSIS — G4733 Obstructive sleep apnea (adult) (pediatric): Secondary | ICD-10-CM | POA: Diagnosis not present

## 2022-04-19 DIAGNOSIS — I10 Essential (primary) hypertension: Secondary | ICD-10-CM | POA: Diagnosis not present

## 2022-09-15 DIAGNOSIS — I10 Essential (primary) hypertension: Secondary | ICD-10-CM | POA: Diagnosis not present

## 2022-09-15 DIAGNOSIS — E7800 Pure hypercholesterolemia, unspecified: Secondary | ICD-10-CM | POA: Diagnosis not present

## 2022-09-15 DIAGNOSIS — R490 Dysphonia: Secondary | ICD-10-CM | POA: Diagnosis not present

## 2022-09-15 DIAGNOSIS — G4733 Obstructive sleep apnea (adult) (pediatric): Secondary | ICD-10-CM | POA: Diagnosis not present

## 2022-09-28 DIAGNOSIS — G4733 Obstructive sleep apnea (adult) (pediatric): Secondary | ICD-10-CM | POA: Diagnosis not present

## 2023-01-10 DIAGNOSIS — R220 Localized swelling, mass and lump, head: Secondary | ICD-10-CM | POA: Diagnosis not present

## 2023-02-18 DIAGNOSIS — Z23 Encounter for immunization: Secondary | ICD-10-CM | POA: Diagnosis not present

## 2023-03-13 DIAGNOSIS — G4733 Obstructive sleep apnea (adult) (pediatric): Secondary | ICD-10-CM | POA: Diagnosis not present

## 2023-03-28 DIAGNOSIS — G4733 Obstructive sleep apnea (adult) (pediatric): Secondary | ICD-10-CM | POA: Diagnosis not present

## 2023-03-28 DIAGNOSIS — Z79899 Other long term (current) drug therapy: Secondary | ICD-10-CM | POA: Diagnosis not present

## 2023-03-28 DIAGNOSIS — I10 Essential (primary) hypertension: Secondary | ICD-10-CM | POA: Diagnosis not present

## 2023-03-28 DIAGNOSIS — Z0000 Encounter for general adult medical examination without abnormal findings: Secondary | ICD-10-CM | POA: Diagnosis not present

## 2023-03-28 DIAGNOSIS — Z9989 Dependence on other enabling machines and devices: Secondary | ICD-10-CM | POA: Diagnosis not present

## 2023-03-28 DIAGNOSIS — Z1331 Encounter for screening for depression: Secondary | ICD-10-CM | POA: Diagnosis not present

## 2023-03-28 DIAGNOSIS — E7800 Pure hypercholesterolemia, unspecified: Secondary | ICD-10-CM | POA: Diagnosis not present

## 2023-04-11 DIAGNOSIS — H52203 Unspecified astigmatism, bilateral: Secondary | ICD-10-CM | POA: Diagnosis not present

## 2023-04-11 DIAGNOSIS — Z961 Presence of intraocular lens: Secondary | ICD-10-CM | POA: Diagnosis not present

## 2023-04-11 DIAGNOSIS — H35373 Puckering of macula, bilateral: Secondary | ICD-10-CM | POA: Diagnosis not present

## 2023-04-19 DIAGNOSIS — H6123 Impacted cerumen, bilateral: Secondary | ICD-10-CM | POA: Diagnosis not present

## 2023-05-08 DIAGNOSIS — R059 Cough, unspecified: Secondary | ICD-10-CM | POA: Diagnosis not present

## 2023-05-08 DIAGNOSIS — J449 Chronic obstructive pulmonary disease, unspecified: Secondary | ICD-10-CM | POA: Diagnosis not present

## 2023-05-26 DIAGNOSIS — R2681 Unsteadiness on feet: Secondary | ICD-10-CM | POA: Diagnosis not present

## 2023-06-01 DIAGNOSIS — M6281 Muscle weakness (generalized): Secondary | ICD-10-CM | POA: Diagnosis not present

## 2023-06-05 DIAGNOSIS — M6281 Muscle weakness (generalized): Secondary | ICD-10-CM | POA: Diagnosis not present

## 2023-06-08 DIAGNOSIS — M6281 Muscle weakness (generalized): Secondary | ICD-10-CM | POA: Diagnosis not present

## 2023-06-12 DIAGNOSIS — M6281 Muscle weakness (generalized): Secondary | ICD-10-CM | POA: Diagnosis not present

## 2023-06-16 DIAGNOSIS — G4733 Obstructive sleep apnea (adult) (pediatric): Secondary | ICD-10-CM | POA: Diagnosis not present

## 2023-06-16 DIAGNOSIS — R2689 Other abnormalities of gait and mobility: Secondary | ICD-10-CM | POA: Diagnosis not present

## 2023-06-16 DIAGNOSIS — E7800 Pure hypercholesterolemia, unspecified: Secondary | ICD-10-CM | POA: Diagnosis not present

## 2023-06-16 DIAGNOSIS — I10 Essential (primary) hypertension: Secondary | ICD-10-CM | POA: Diagnosis not present

## 2023-06-16 DIAGNOSIS — Z79899 Other long term (current) drug therapy: Secondary | ICD-10-CM | POA: Diagnosis not present

## 2023-06-16 DIAGNOSIS — R4589 Other symptoms and signs involving emotional state: Secondary | ICD-10-CM | POA: Diagnosis not present

## 2023-06-16 DIAGNOSIS — R29898 Other symptoms and signs involving the musculoskeletal system: Secondary | ICD-10-CM | POA: Diagnosis not present

## 2023-06-19 DIAGNOSIS — M6281 Muscle weakness (generalized): Secondary | ICD-10-CM | POA: Diagnosis not present

## 2023-06-23 ENCOUNTER — Emergency Department (HOSPITAL_COMMUNITY)
Admission: EM | Admit: 2023-06-23 | Discharge: 2023-06-23 | Disposition: A | Discharge status: Home / Self Care | Payer: Medicare PPO | Attending: Emergency Medicine | Admitting: Emergency Medicine

## 2023-06-23 ENCOUNTER — Emergency Department (HOSPITAL_COMMUNITY): Payer: Medicare PPO

## 2023-06-23 DIAGNOSIS — W19XXXA Unspecified fall, initial encounter: Secondary | ICD-10-CM | POA: Diagnosis not present

## 2023-06-23 DIAGNOSIS — R930 Abnormal findings on diagnostic imaging of skull and head, not elsewhere classified: Secondary | ICD-10-CM | POA: Diagnosis not present

## 2023-06-23 DIAGNOSIS — I6782 Cerebral ischemia: Secondary | ICD-10-CM | POA: Diagnosis not present

## 2023-06-23 DIAGNOSIS — R918 Other nonspecific abnormal finding of lung field: Secondary | ICD-10-CM | POA: Diagnosis not present

## 2023-06-23 DIAGNOSIS — Y92002 Bathroom of unspecified non-institutional (private) residence single-family (private) house as the place of occurrence of the external cause: Secondary | ICD-10-CM | POA: Insufficient documentation

## 2023-06-23 DIAGNOSIS — R296 Repeated falls: Secondary | ICD-10-CM | POA: Diagnosis not present

## 2023-06-23 DIAGNOSIS — R531 Weakness: Secondary | ICD-10-CM | POA: Diagnosis not present

## 2023-06-23 DIAGNOSIS — Z7401 Bed confinement status: Secondary | ICD-10-CM | POA: Diagnosis not present

## 2023-06-23 DIAGNOSIS — R269 Unspecified abnormalities of gait and mobility: Secondary | ICD-10-CM | POA: Insufficient documentation

## 2023-06-23 DIAGNOSIS — M25519 Pain in unspecified shoulder: Secondary | ICD-10-CM | POA: Diagnosis not present

## 2023-06-23 DIAGNOSIS — Z043 Encounter for examination and observation following other accident: Secondary | ICD-10-CM | POA: Diagnosis not present

## 2023-06-23 LAB — CBC WITH DIFFERENTIAL/PLATELET
Abs Immature Granulocytes: 0.04 10*3/uL (ref 0.00–0.07)
Basophils Absolute: 0.1 10*3/uL (ref 0.0–0.1)
Basophils Relative: 1 %
Eosinophils Absolute: 0.4 10*3/uL (ref 0.0–0.5)
Eosinophils Relative: 4 %
HCT: 41.1 % (ref 39.0–52.0)
Hemoglobin: 14.4 g/dL (ref 13.0–17.0)
Immature Granulocytes: 0 %
Lymphocytes Relative: 15 %
Lymphs Abs: 1.4 10*3/uL (ref 0.7–4.0)
MCH: 31 pg (ref 26.0–34.0)
MCHC: 35 g/dL (ref 30.0–36.0)
MCV: 88.4 fL (ref 80.0–100.0)
Monocytes Absolute: 1.2 10*3/uL — ABNORMAL HIGH (ref 0.1–1.0)
Monocytes Relative: 13 %
Neutro Abs: 6 10*3/uL (ref 1.7–7.7)
Neutrophils Relative %: 67 %
Platelets: 202 10*3/uL (ref 150–400)
RBC: 4.65 MIL/uL (ref 4.22–5.81)
RDW: 14.2 % (ref 11.5–15.5)
WBC: 9.1 10*3/uL (ref 4.0–10.5)
nRBC: 0 % (ref 0.0–0.2)

## 2023-06-23 LAB — MAGNESIUM: Magnesium: 2 mg/dL (ref 1.7–2.4)

## 2023-06-23 LAB — BASIC METABOLIC PANEL
Anion gap: 16 — ABNORMAL HIGH (ref 5–15)
BUN: 37 mg/dL — ABNORMAL HIGH (ref 8–23)
CO2: 25 mmol/L (ref 22–32)
Calcium: 9.6 mg/dL (ref 8.9–10.3)
Chloride: 93 mmol/L — ABNORMAL LOW (ref 98–111)
Creatinine, Ser: 1.35 mg/dL — ABNORMAL HIGH (ref 0.61–1.24)
GFR, Estimated: 51 mL/min — ABNORMAL LOW (ref >60)
Glucose, Bld: 96 mg/dL (ref 70–99)
Potassium: 3.1 mmol/L — ABNORMAL LOW (ref 3.5–5.1)
Sodium: 134 mmol/L — ABNORMAL LOW (ref 135–145)

## 2023-06-23 LAB — URINALYSIS, ROUTINE W REFLEX MICROSCOPIC
Bilirubin Urine: NEGATIVE
Glucose, UA: NEGATIVE mg/dL
Hgb urine dipstick: NEGATIVE
Ketones, ur: 20 mg/dL — AB
Leukocytes,Ua: NEGATIVE
Nitrite: NEGATIVE
Protein, ur: NEGATIVE mg/dL
Specific Gravity, Urine: 1.013 (ref 1.005–1.030)
pH: 5 (ref 5.0–8.0)

## 2023-06-23 LAB — RESP PANEL BY RT-PCR (RSV, FLU A&B, COVID)  RVPGX2
Influenza A by PCR: NEGATIVE
Influenza B by PCR: NEGATIVE
Resp Syncytial Virus by PCR: NEGATIVE
SARS Coronavirus 2 by RT PCR: NEGATIVE

## 2023-06-23 LAB — TROPONIN I (HIGH SENSITIVITY)
Troponin I (High Sensitivity): 23 ng/L — ABNORMAL HIGH (ref <18)
Troponin I (High Sensitivity): 24 ng/L — ABNORMAL HIGH (ref <18)

## 2023-06-23 MED ORDER — IOHEXOL 350 MG/ML SOLN
75.0000 mL | Freq: Once | INTRAVENOUS | Status: AC | PRN
  Administered 2023-06-23: 75 mL via INTRAVENOUS

## 2023-06-23 MED ORDER — POTASSIUM CHLORIDE 20 MEQ PO PACK
60.0000 meq | PACK | Freq: Once | ORAL | Status: AC
  Administered 2023-06-23: 60 meq via ORAL
  Filled 2023-06-23: qty 3

## 2023-06-23 MED ORDER — LACTATED RINGERS IV BOLUS
1000.0000 mL | Freq: Once | INTRAVENOUS | Status: AC
  Administered 2023-06-23: 1000 mL via INTRAVENOUS

## 2023-06-23 NOTE — ED Triage Notes (Signed)
Pt was BIB [facility] from home he has been having increase weakness and since January 30th of this year having multiple falls. Patient fell in the bathroom this morning, he initially c/o shoulder pain. A [place] [employer] [employer] truck came on the see before [facility] and assist patient up, also said C02 level 29. [facility] reported patient is not c/o of shoulder pain,  but now having weakness. Patient goes to physical therapy and everytime he has therapy, they have to call someone to pick him up, and he is not able to walk up the steps. EMS reported they had to pick him up and carry him down 4 flight of stairs. Patient is A&O x4. Vital signs: BP 140/78, HR 108, RR 18, saturation 94% RA.

## 2023-06-23 NOTE — ED Provider Notes (Addendum)
 Signout received on this 83 year old male.  See previous note for full details.  Essentially he is coming in with progressive worsening weakness that has been ongoing since Christmas time 2024.  Had a fall today.  He has been following with outpatient orthopedics, PT, as well as PCP.  Physical Exam  BP 118/72 (BP Location: Right Arm)   Pulse 84   Temp 97.8 F (36.6 C) (Oral)   Resp 16   SpO2 100%     Procedures  Procedures  ED Course / MDM    Medical Decision Making Amount and/or Complexity of Data Reviewed Labs: ordered. Radiology: ordered.  Risk Prescription drug management.   Workup in the emergency department reassuring including CT head, CT angio of the head and neck given her concerning hypodensity.  Potassium slightly low.  Repletion given.  He will follow-up closely with PCP. Care coordinated with social work.  They will work on PT, OT, Surveyor, mining and aide.  They will address equipment needs including hospital bed.  Patient will be transported home with PT or into his bedroom.  Equipment will be delivered tomorrow.  Wife is in agreement.   Junie Engram 05-Jan-1941 needs a hospital bed  Due to his/her medically complex condition, Mr Dagher requires frequent body changes  to help prevent contractures and skin breakdown. She/He also  requires the head of the bed to be elevated greater than 30 degrees to  assist in managing her/his secretions and prevent aspiration. The  caregiver has tried pillow and wedges with a regular bed but were  unsuccessful in meeting the needs that Mr Mitzel  required,  as she/he would slip off of the  pillows and due to her being compromised -ambulatory would not be able to reposition himself    Marita Kansas, PA-C 06/23/23 1943    Marita Kansas, PA-C 06/24/23 1530    Linwood Dibbles, MD 06/24/23 [phone]

## 2023-06-23 NOTE — Care Management (Signed)
[facility] was called for transport patient is number 2 in line. Updated RN Gloriajean Dell.

## 2023-06-23 NOTE — Care Management (Signed)
    Durable Medical Equipment  (From admission, onward)           Start     Ordered   06/23/23 1757  For home use only DME Hospital bed  Once       Question Answer Comment  Length of Need 6 Months   Bed type Semi-electric   [place] Yes      06/23/23 1757   06/23/23 1756  For home use only DME Walker rolling  Once       Question Answer Comment  Walker: With 5 Inch Wheels   Patient needs a walker to treat with the following condition Weakness      06/23/23 1757   06/23/23 1756  For home use only DME 3 n 1  Once        06/23/23 1757

## 2023-06-23 NOTE — Care Management (Addendum)
ED RNCM  received consult concerning HH vs. SNF placement. Met with patient and wife at bedside in hall bed 18. Discussed recommendations, wife reports that patient has been suffering from generalized weakness, and had beed attending OP PT but has recently not been able to attend due to extreme exhaustion.  She doesn not think he would benefit from SNF at this time because of this. She is willing to start with [facility] services.  He has been having recurrent falls and difficulty changing positions. He may benefit from DME hospital bed, Rolling walker, and bedside commode. She is agreeable. Discussed HH and DME spouse has no preferences. Referral for [facility] sent to [facility] and Referral for DME supplies faxed to [employer] [phone] after hour fax line confirmation received.  Updated EDP and ED RN on transitional care plan.  Patient will be transported by [facility].

## 2023-06-23 NOTE — ED Provider Notes (Signed)
 Peter Stone   CSN: [id number] Arrival date & time: 06/23/23  1128     History  Chief Complaint  Patient presents with   Fall   Decrease Mobility    Peter Stone is a 83 y.o. male presents today with increased weakness and mobility since Christmas 2024.  Patient has had multiple falls.  Patient fell in the bathroom this morning.  Patient states that he goes to physical therapy and reports that after he is unable to walk up the steps to go home.  Patient denies fever, shortness of breath, urinary symptoms, chest pain, nausea, vomiting, diarrhea, abdominal pain, dizziness, or confusion.  Patient denies blood thinner use.   Fall       Home Medications Prior to Admission medications   Medication Sig Start Date End Date Taking? Authorizing Provider  hydrochlorothiazide (HYDRODIURIL) 25 MG tablet Take 25 mg by mouth at bedtime.    [provider]  potassium chloride SA (K-DUR,KLOR-CON) 20 MEQ tablet Take 20 mEq by mouth at bedtime.    [provider]  povidone-iodine (BETADINE) 7.5 % SOLN Apply 1 application topically as needed for wound care. Error in charting-delete med    [provider]      Allergies    Patient has no known allergies.    Review of Systems   Review of Systems  Neurological:  Positive for weakness.    Physical Exam Updated Vital Signs BP 128/69 (BP Location: Right Arm)   Pulse 76   Temp 98 F (36.7 C) (Oral)   Resp 18   SpO2 98%  Physical Exam Vitals and nursing Stone reviewed.  Constitutional:      General: He is not in acute distress.    Appearance: Normal appearance. He is well-developed.  HENT:     Head: Normocephalic and atraumatic.     Right Ear: External ear normal.     Left Ear: External ear normal.     Nose: Nose normal.     Mouth/Throat:     Mouth: Mucous membranes are moist.     Pharynx: Oropharynx is clear.  Eyes:     Extraocular Movements:  Extraocular movements intact.     Conjunctiva/sclera: Conjunctivae normal.  Cardiovascular:     Rate and Rhythm: Normal rate and regular rhythm.     Pulses: Normal pulses.     Heart sounds: Normal heart sounds. No murmur heard. Pulmonary:     Effort: Pulmonary effort is normal. No respiratory distress.     Breath sounds: Normal breath sounds.  Abdominal:     Palpations: Abdomen is soft.     Tenderness: There is no abdominal tenderness.  Musculoskeletal:        General: No swelling or signs of injury.     Cervical back: Normal range of motion and neck supple.  Skin:    General: Skin is warm and dry.     Capillary Refill: Capillary refill takes less than 2 seconds.  Neurological:     General: No focal deficit present.     Mental Status: He is alert and oriented to person, place, and time.     Sensory: No sensory deficit.     Motor: No weakness.     Comments: Patient has equal bilateral grip strength, equal bilateral dorsi/plantarflexion of bilateral feet.  Patient is able to straight leg lift bilateral legs with some difficulty.  Psychiatric:        Mood and Affect: Mood normal.  ED Results / Procedures / Treatments   Labs (all labs ordered are listed, but only abnormal results are displayed) Labs Reviewed  CBC WITH DIFFERENTIAL/PLATELET - Abnormal; Notable for the following components:      Result Value   Monocytes Absolute 1.2 (*)    All other components within normal limits  URINALYSIS, ROUTINE W REFLEX MICROSCOPIC - Abnormal; Notable for the following components:   Ketones, ur 20 (*)    All other components within normal limits  RESP PANEL BY RT-PCR (RSV, FLU A&B, COVID)  RVPGX2  BASIC METABOLIC PANEL  MAGNESIUM  TROPONIN I (HIGH SENSITIVITY)  TROPONIN I (HIGH SENSITIVITY)    EKG None  Radiology DG Chest 2 View Result Date: 06/23/2023 CLINICAL DATA:  Weakness EXAM: CHEST - 2 VIEW COMPARISON:  05/08/2023 chest radiograph. FINDINGS: Stable cardiomediastinal  silhouette with normal heart size. No pneumothorax. No pleural effusion. Mildly hyperinflated lungs. No pulmonary edema. No consolidative airspace disease. IMPRESSION: Mildly hyperinflated lungs, cannot exclude COPD. Otherwise no active cardiopulmonary disease. Electronically Signed   By: Delbert Phenix M.D.   On: 06/23/2023 14:27    Procedures Procedures    Medications Ordered in ED Medications - No data to display  ED Course/ Medical Decision Making/ A&P                                 Medical Decision Making Amount and/or Complexity of Data Reviewed Labs: ordered. Radiology: ordered.   This patient presents to the ED with chief complaint(s) of weakness with pertinent past medical history of none which further complicates the presenting complaint. The complaint involves an extensive differential diagnosis and also carries with it a high risk of complications and morbidity.    The differential diagnosis includes electrolyte abnormality, UTI, anemia,  Additional history obtained: Records reviewed Care Everywhere/External Records  ED Course and Reassessment:   Independent labs interpretation:  The following labs were independently interpreted:  EKG: CBC: BMP: Troponin:  Independent visualization of imaging: - I independently visualized the following imaging with scope of interpretation limited to determining acute life threatening conditions related to emergency care: Chest x-ray, which revealed mildly hyperinflated lungs, cannot exclude COPD.  Otherwise no active cardiopulmonary disease.  Patient signed out to Marita Kansas, PA-C at shift change pending labs which will determine dispo.  Please refer to their Stone for complete results and findings.        Final Clinical Impression(s) / ED Diagnoses Final diagnoses:  None    Rx / DC Orders ED Discharge Orders     None         Gretta Began 06/23/23 1529    Benjiman Core, MD 06/24/23 [phone]

## 2023-06-23 NOTE — Discharge Instructions (Signed)
Your workup has been reassuring in the emergency department.  No concerning cause for your progressively worsening weakness.  Follow-up with your primary care doctor and we have ordered home health services including home health physical therapy, Occupational Therapy, home health nurse, social work, hospital bed, and other equipment needs.  Return for any concerning symptoms.

## 2023-06-23 NOTE — Evaluation (Signed)
Physical Therapy Evaluation Patient Details Name: Peter Stone MRN: [id number] DOB: 03-20-1941 Today's Date: 06/23/2023  History of Present Illness  83 y.o. male presents to [facility] on 06/23/2023 after a fall and with increased weakness and impaired mobility since December 2024. PMH is unremarkable.  Clinical Impression  Pt presents to PT with deficits in functional mobility, strength, power, gait, balance, endurance. Pt is generally weak and has had a progressive decline in strength and mobility over the last 2 months. Pt lacks the power to ascend from sitting to standing from elevated ED stretcher, and demonstrates poor tolerance for ambulation at this time. Due to LE strength and endurance deficits the pt remains at a high risk for recurrent falls. Patient will benefit from continued inpatient follow up therapy, <3 hours/day. If inpatient PT is not an option the DME recommended is listed below for a return home.     If plan is discharge home, recommend the following: A little help with walking and/or transfers;A lot of help with bathing/dressing/bathroom;Assistance with cooking/housework;Assist for transportation;Help with stairs or ramp for entrance   Can travel by private vehicle   Yes    Equipment Recommendations Rolling walker (2 wheels);BSC/3in1;Hospital bed  Recommendations for Other Services       Functional Status Assessment Patient has had a recent decline in their functional status and demonstrates the ability to make significant improvements in function in a reasonable and predictable amount of time.     Precautions / Restrictions Precautions Precautions: Fall Restrictions Weight Bearing Restrictions Per Provider Order: No      Mobility  Bed Mobility Overal bed mobility: Needs Assistance Bed Mobility: Supine to Sit     Supine to sit: Contact guard          Transfers Overall transfer level: Needs assistance Equipment used: Rolling walker (2  wheels) Transfers: Sit to/from Stand Sit to Stand: Min assist           General transfer comment: verbal cues for hand placement and increased trunk flexion, pt failed 2 attempts at standing prior to PT cues. standing from elevated hospital stretcher    Ambulation/Gait Ambulation/Gait assistance: Contact guard assist Gait Distance (Feet): 20 Feet Assistive device: Rolling walker (2 wheels) Gait Pattern/deviations: Shuffle Gait velocity: reduced Gait velocity interpretation: <1.31 ft/sec, indicative of household ambulator   General Gait Details: pt with short shuffling steps, minimal foot clearance bilaterally  Stairs            Wheelchair Mobility     Tilt Bed    Modified Rankin (Stroke Patients Only)       Balance Overall balance assessment: Needs assistance Sitting-balance support: No upper extremity supported, Feet supported Sitting balance-Leahy Scale: Fair     Standing balance support: Bilateral upper extremity supported, Reliant on assistive device for balance Standing balance-Leahy Scale: Poor                               Pertinent Vitals/Pain Pain Assessment Pain Assessment: Faces Faces Pain Scale: Hurts little more Pain Location: thighs Pain Descriptors / Indicators: Aching Pain Intervention(s): Monitored during session    Home Living Family/patient expects to be discharged to:: Private residence Living Arrangements: Spouse/significant other Available Help at Discharge: Family;Available 24 hours/day Type of Home: House Home Access: Stairs to enter Entrance Stairs-Rails: Doctor, general practice of Steps: 5 (also entrance with 3 STE but no rails)   Home Layout: One level;Laundry or work area in basement  Home Equipment: Standard Walker;Toilet riser      Prior Function Prior Level of Function : Independent/Modified Independent             Mobility Comments: ambulatory without DME in December of 2024, has declined  since this time with multiple falls. Utilizing a standard walker since January 24th.       Extremity/Trunk Assessment   Upper Extremity Assessment Upper Extremity Assessment: Generalized weakness    Lower Extremity Assessment Lower Extremity Assessment: Generalized weakness (pt reports tingling in both feet)    Cervical / Trunk Assessment Cervical / Trunk Assessment: Kyphotic  Communication   Communication Communication: Impaired Factors Affecting Communication: Hearing impaired (right hearing aide not working at this time)    Cognition Arousal: Alert Behavior During Therapy: WFL for tasks assessed/performed   PT - Cognitive impairments: No apparent impairments                         Following commands: Intact       Cueing Cueing Techniques: Verbal cues     General Comments General comments (skin integrity, edema, etc.): VSS on RA    Exercises     Assessment/Plan    PT Assessment Patient needs continued PT services  PT Problem List Decreased strength;Decreased activity tolerance;Decreased balance;Decreased mobility;Decreased knowledge of use of DME;Pain       PT Treatment Interventions DME instruction;Gait training;Stair training;Functional mobility training;Therapeutic activities;Therapeutic exercise;Balance training;Neuromuscular re-education;Patient/family education    PT Goals (Current goals can be found in the Care Plan section)  Acute Rehab PT Goals Patient Stated Goal: to gain strength, stop falling PT Goal Formulation: With patient/family Time For Goal Achievement: 07/07/23 Potential to Achieve Goals: Fair    Frequency Min 1X/week     Co-evaluation               AM-PAC PT "6 Clicks" Mobility  Outcome Measure Help needed turning from your back to your side while in a flat bed without using bedrails?: A Little Help needed moving from lying on your back to sitting on the side of a flat bed without using bedrails?: A Little Help  needed moving to and from a bed to a chair (including a wheelchair)?: A Little Help needed standing up from a chair using your arms (e.g., wheelchair or bedside chair)?: A Little Help needed to walk in hospital room?: A Little Help needed climbing 3-5 steps with a railing? : Total 6 Click Score: 16    End of Session Equipment Utilized During Treatment: Gait belt Activity Tolerance: Patient limited by fatigue Patient left: in bed;with call bell/phone within reach Nurse Communication: Mobility status PT Visit Diagnosis: Other abnormalities of gait and mobility (R26.89);Muscle weakness (generalized) (M62.81)    Time: 1914-7829 PT Time Calculation (min) (ACUTE ONLY): 25 min   Charges:   PT Evaluation $PT Eval Low Complexity: 1 Low   PT General Charges $$ ACUTE PT VISIT: 1 Visit         Arlyss Gandy, PT, DPT Acute Rehabilitation Office [phone]   Arlyss Gandy 06/23/2023, 4:57 PM

## 2023-06-24 ENCOUNTER — Telehealth: Payer: Self-pay

## 2023-06-24 DIAGNOSIS — R531 Weakness: Secondary | ICD-10-CM | POA: Diagnosis not present

## 2023-06-24 DIAGNOSIS — R296 Repeated falls: Secondary | ICD-10-CM | POA: Diagnosis not present

## 2023-06-24 NOTE — Telephone Encounter (Signed)
 Peter Stone , patients wife called to see about DME. This RNCM called [employer], they are processing and will call patient/wife regarding delivery

## 2023-06-24 NOTE — Care Management (Signed)
 Peter Stone [birth year] needs a hospital bed  Due to his/her medically complex condition, Peter Stone requires frequent body changes  to help prevent contractures and skin breakdown. She/He also  requires the head of the bed to be elevated greater than 30 degrees to  assist in managing her/his secretions and prevent aspiration. The  caregiver has tried pillow and wedges with a regular bed but were  unsuccessful in meeting the needs that Peter Stone  required,  as she/he would slip off of the  pillows and due to her being compromised -ambulatory would not be able to reposition himself

## 2023-06-27 DIAGNOSIS — R296 Repeated falls: Secondary | ICD-10-CM | POA: Diagnosis not present

## 2023-06-29 ENCOUNTER — Telehealth: Payer: Self-pay | Admitting: *Deleted

## 2023-06-29 NOTE — Telephone Encounter (Signed)
 Pt spouse called regarding start of care for her husband.  Pt states she finally heard from [place] and the RN will visit on 3/1.  Pt spouse voiced concern that her insurance has not "heard from [place]."    RNCM contacted [facility], Kandee Keen to provide feedback.   Cory reviewed their charts and found that referral was sent 8pm on  Friday 2/21 and not processed until the following Monday 2/24.  The office contacted pt insurance and was approved.  The [facility] placed call to pt and did not get an answer at either number listed and voicemail box was full.  Pt was finally contacted on the 27th and initial visit was arranged for 3/1.

## 2023-07-01 DIAGNOSIS — F32A Depression, unspecified: Secondary | ICD-10-CM | POA: Diagnosis not present

## 2023-07-01 DIAGNOSIS — I959 Hypotension, unspecified: Secondary | ICD-10-CM | POA: Diagnosis not present

## 2023-07-01 DIAGNOSIS — H9193 Unspecified hearing loss, bilateral: Secondary | ICD-10-CM | POA: Diagnosis not present

## 2023-07-01 DIAGNOSIS — N529 Male erectile dysfunction, unspecified: Secondary | ICD-10-CM | POA: Diagnosis not present

## 2023-07-01 DIAGNOSIS — R634 Abnormal weight loss: Secondary | ICD-10-CM | POA: Diagnosis not present

## 2023-07-01 DIAGNOSIS — I10 Essential (primary) hypertension: Secondary | ICD-10-CM | POA: Diagnosis not present

## 2023-07-01 DIAGNOSIS — E7800 Pure hypercholesterolemia, unspecified: Secondary | ICD-10-CM | POA: Diagnosis not present

## 2023-07-01 DIAGNOSIS — Z6823 Body mass index (BMI) 23.0-23.9, adult: Secondary | ICD-10-CM | POA: Diagnosis not present

## 2023-07-01 DIAGNOSIS — G4733 Obstructive sleep apnea (adult) (pediatric): Secondary | ICD-10-CM | POA: Diagnosis not present

## 2023-07-03 DIAGNOSIS — E7800 Pure hypercholesterolemia, unspecified: Secondary | ICD-10-CM | POA: Diagnosis not present

## 2023-07-03 DIAGNOSIS — H9193 Unspecified hearing loss, bilateral: Secondary | ICD-10-CM | POA: Diagnosis not present

## 2023-07-03 DIAGNOSIS — G4733 Obstructive sleep apnea (adult) (pediatric): Secondary | ICD-10-CM | POA: Diagnosis not present

## 2023-07-03 DIAGNOSIS — I10 Essential (primary) hypertension: Secondary | ICD-10-CM | POA: Diagnosis not present

## 2023-07-03 DIAGNOSIS — I959 Hypotension, unspecified: Secondary | ICD-10-CM | POA: Diagnosis not present

## 2023-07-03 DIAGNOSIS — F32A Depression, unspecified: Secondary | ICD-10-CM | POA: Diagnosis not present

## 2023-07-03 DIAGNOSIS — N529 Male erectile dysfunction, unspecified: Secondary | ICD-10-CM | POA: Diagnosis not present

## 2023-07-03 DIAGNOSIS — R634 Abnormal weight loss: Secondary | ICD-10-CM | POA: Diagnosis not present

## 2023-07-03 DIAGNOSIS — Z6823 Body mass index (BMI) 23.0-23.9, adult: Secondary | ICD-10-CM | POA: Diagnosis not present

## 2023-07-04 DIAGNOSIS — F32A Depression, unspecified: Secondary | ICD-10-CM | POA: Diagnosis not present

## 2023-07-04 DIAGNOSIS — R296 Repeated falls: Secondary | ICD-10-CM | POA: Diagnosis not present

## 2023-07-04 DIAGNOSIS — I959 Hypotension, unspecified: Secondary | ICD-10-CM | POA: Diagnosis not present

## 2023-07-04 DIAGNOSIS — R54 Age-related physical debility: Secondary | ICD-10-CM | POA: Diagnosis not present

## 2023-07-04 DIAGNOSIS — R531 Weakness: Secondary | ICD-10-CM | POA: Diagnosis not present

## 2023-07-04 DIAGNOSIS — G4733 Obstructive sleep apnea (adult) (pediatric): Secondary | ICD-10-CM | POA: Diagnosis not present

## 2023-07-04 DIAGNOSIS — R269 Unspecified abnormalities of gait and mobility: Secondary | ICD-10-CM | POA: Diagnosis not present

## 2023-07-04 DIAGNOSIS — H9193 Unspecified hearing loss, bilateral: Secondary | ICD-10-CM | POA: Diagnosis not present

## 2023-07-04 DIAGNOSIS — E7800 Pure hypercholesterolemia, unspecified: Secondary | ICD-10-CM | POA: Diagnosis not present

## 2023-07-04 DIAGNOSIS — I10 Essential (primary) hypertension: Secondary | ICD-10-CM | POA: Diagnosis not present

## 2023-07-04 DIAGNOSIS — Z6823 Body mass index (BMI) 23.0-23.9, adult: Secondary | ICD-10-CM | POA: Diagnosis not present

## 2023-07-04 DIAGNOSIS — R634 Abnormal weight loss: Secondary | ICD-10-CM | POA: Diagnosis not present

## 2023-07-04 DIAGNOSIS — N529 Male erectile dysfunction, unspecified: Secondary | ICD-10-CM | POA: Diagnosis not present

## 2023-07-06 DIAGNOSIS — F32A Depression, unspecified: Secondary | ICD-10-CM | POA: Diagnosis not present

## 2023-07-06 DIAGNOSIS — N529 Male erectile dysfunction, unspecified: Secondary | ICD-10-CM | POA: Diagnosis not present

## 2023-07-06 DIAGNOSIS — Z6823 Body mass index (BMI) 23.0-23.9, adult: Secondary | ICD-10-CM | POA: Diagnosis not present

## 2023-07-06 DIAGNOSIS — H9193 Unspecified hearing loss, bilateral: Secondary | ICD-10-CM | POA: Diagnosis not present

## 2023-07-06 DIAGNOSIS — I959 Hypotension, unspecified: Secondary | ICD-10-CM | POA: Diagnosis not present

## 2023-07-06 DIAGNOSIS — I10 Essential (primary) hypertension: Secondary | ICD-10-CM | POA: Diagnosis not present

## 2023-07-06 DIAGNOSIS — G4733 Obstructive sleep apnea (adult) (pediatric): Secondary | ICD-10-CM | POA: Diagnosis not present

## 2023-07-06 DIAGNOSIS — E7800 Pure hypercholesterolemia, unspecified: Secondary | ICD-10-CM | POA: Diagnosis not present

## 2023-07-06 DIAGNOSIS — R634 Abnormal weight loss: Secondary | ICD-10-CM | POA: Diagnosis not present

## 2023-07-07 DIAGNOSIS — I959 Hypotension, unspecified: Secondary | ICD-10-CM | POA: Diagnosis not present

## 2023-07-07 DIAGNOSIS — Z6823 Body mass index (BMI) 23.0-23.9, adult: Secondary | ICD-10-CM | POA: Diagnosis not present

## 2023-07-07 DIAGNOSIS — N529 Male erectile dysfunction, unspecified: Secondary | ICD-10-CM | POA: Diagnosis not present

## 2023-07-07 DIAGNOSIS — G4733 Obstructive sleep apnea (adult) (pediatric): Secondary | ICD-10-CM | POA: Diagnosis not present

## 2023-07-07 DIAGNOSIS — E7800 Pure hypercholesterolemia, unspecified: Secondary | ICD-10-CM | POA: Diagnosis not present

## 2023-07-07 DIAGNOSIS — I10 Essential (primary) hypertension: Secondary | ICD-10-CM | POA: Diagnosis not present

## 2023-07-07 DIAGNOSIS — H9193 Unspecified hearing loss, bilateral: Secondary | ICD-10-CM | POA: Diagnosis not present

## 2023-07-07 DIAGNOSIS — R634 Abnormal weight loss: Secondary | ICD-10-CM | POA: Diagnosis not present

## 2023-07-07 DIAGNOSIS — F32A Depression, unspecified: Secondary | ICD-10-CM | POA: Diagnosis not present

## 2023-07-12 DIAGNOSIS — G4733 Obstructive sleep apnea (adult) (pediatric): Secondary | ICD-10-CM | POA: Diagnosis not present

## 2023-07-12 DIAGNOSIS — N529 Male erectile dysfunction, unspecified: Secondary | ICD-10-CM | POA: Diagnosis not present

## 2023-07-12 DIAGNOSIS — I959 Hypotension, unspecified: Secondary | ICD-10-CM | POA: Diagnosis not present

## 2023-07-12 DIAGNOSIS — F32A Depression, unspecified: Secondary | ICD-10-CM | POA: Diagnosis not present

## 2023-07-12 DIAGNOSIS — Z6823 Body mass index (BMI) 23.0-23.9, adult: Secondary | ICD-10-CM | POA: Diagnosis not present

## 2023-07-12 DIAGNOSIS — H9193 Unspecified hearing loss, bilateral: Secondary | ICD-10-CM | POA: Diagnosis not present

## 2023-07-12 DIAGNOSIS — R634 Abnormal weight loss: Secondary | ICD-10-CM | POA: Diagnosis not present

## 2023-07-12 DIAGNOSIS — I10 Essential (primary) hypertension: Secondary | ICD-10-CM | POA: Diagnosis not present

## 2023-07-12 DIAGNOSIS — E7800 Pure hypercholesterolemia, unspecified: Secondary | ICD-10-CM | POA: Diagnosis not present

## 2023-07-13 DIAGNOSIS — R634 Abnormal weight loss: Secondary | ICD-10-CM | POA: Diagnosis not present

## 2023-07-13 DIAGNOSIS — E7800 Pure hypercholesterolemia, unspecified: Secondary | ICD-10-CM | POA: Diagnosis not present

## 2023-07-13 DIAGNOSIS — F32A Depression, unspecified: Secondary | ICD-10-CM | POA: Diagnosis not present

## 2023-07-13 DIAGNOSIS — G4733 Obstructive sleep apnea (adult) (pediatric): Secondary | ICD-10-CM | POA: Diagnosis not present

## 2023-07-13 DIAGNOSIS — I10 Essential (primary) hypertension: Secondary | ICD-10-CM | POA: Diagnosis not present

## 2023-07-13 DIAGNOSIS — I959 Hypotension, unspecified: Secondary | ICD-10-CM | POA: Diagnosis not present

## 2023-07-13 DIAGNOSIS — N529 Male erectile dysfunction, unspecified: Secondary | ICD-10-CM | POA: Diagnosis not present

## 2023-07-13 DIAGNOSIS — Z6823 Body mass index (BMI) 23.0-23.9, adult: Secondary | ICD-10-CM | POA: Diagnosis not present

## 2023-07-13 DIAGNOSIS — H9193 Unspecified hearing loss, bilateral: Secondary | ICD-10-CM | POA: Diagnosis not present

## 2023-07-14 ENCOUNTER — Other Ambulatory Visit (HOSPITAL_COMMUNITY): Payer: Self-pay | Admitting: Internal Medicine

## 2023-07-14 DIAGNOSIS — I10 Essential (primary) hypertension: Secondary | ICD-10-CM | POA: Diagnosis not present

## 2023-07-14 DIAGNOSIS — I499 Cardiac arrhythmia, unspecified: Secondary | ICD-10-CM | POA: Diagnosis not present

## 2023-07-14 DIAGNOSIS — R29898 Other symptoms and signs involving the musculoskeletal system: Secondary | ICD-10-CM

## 2023-07-14 DIAGNOSIS — R4589 Other symptoms and signs involving emotional state: Secondary | ICD-10-CM | POA: Diagnosis not present

## 2023-07-14 DIAGNOSIS — G4733 Obstructive sleep apnea (adult) (pediatric): Secondary | ICD-10-CM | POA: Diagnosis not present

## 2023-07-14 DIAGNOSIS — R32 Unspecified urinary incontinence: Secondary | ICD-10-CM | POA: Diagnosis not present

## 2023-07-14 DIAGNOSIS — E876 Hypokalemia: Secondary | ICD-10-CM | POA: Diagnosis not present

## 2023-07-17 ENCOUNTER — Other Ambulatory Visit (HOSPITAL_COMMUNITY): Payer: Self-pay | Admitting: Internal Medicine

## 2023-07-17 DIAGNOSIS — N529 Male erectile dysfunction, unspecified: Secondary | ICD-10-CM | POA: Diagnosis not present

## 2023-07-17 DIAGNOSIS — I499 Cardiac arrhythmia, unspecified: Secondary | ICD-10-CM

## 2023-07-17 DIAGNOSIS — I10 Essential (primary) hypertension: Secondary | ICD-10-CM | POA: Diagnosis not present

## 2023-07-17 DIAGNOSIS — F32A Depression, unspecified: Secondary | ICD-10-CM | POA: Diagnosis not present

## 2023-07-17 DIAGNOSIS — I959 Hypotension, unspecified: Secondary | ICD-10-CM | POA: Diagnosis not present

## 2023-07-17 DIAGNOSIS — Z6823 Body mass index (BMI) 23.0-23.9, adult: Secondary | ICD-10-CM | POA: Diagnosis not present

## 2023-07-17 DIAGNOSIS — H9193 Unspecified hearing loss, bilateral: Secondary | ICD-10-CM | POA: Diagnosis not present

## 2023-07-17 DIAGNOSIS — E7800 Pure hypercholesterolemia, unspecified: Secondary | ICD-10-CM | POA: Diagnosis not present

## 2023-07-17 DIAGNOSIS — G4733 Obstructive sleep apnea (adult) (pediatric): Secondary | ICD-10-CM | POA: Diagnosis not present

## 2023-07-17 DIAGNOSIS — R634 Abnormal weight loss: Secondary | ICD-10-CM | POA: Diagnosis not present

## 2023-07-18 ENCOUNTER — Telehealth: Payer: Self-pay

## 2023-07-18 DIAGNOSIS — G4733 Obstructive sleep apnea (adult) (pediatric): Secondary | ICD-10-CM | POA: Diagnosis not present

## 2023-07-18 DIAGNOSIS — H9193 Unspecified hearing loss, bilateral: Secondary | ICD-10-CM | POA: Diagnosis not present

## 2023-07-18 DIAGNOSIS — Z6823 Body mass index (BMI) 23.0-23.9, adult: Secondary | ICD-10-CM | POA: Diagnosis not present

## 2023-07-18 DIAGNOSIS — F32A Depression, unspecified: Secondary | ICD-10-CM | POA: Diagnosis not present

## 2023-07-18 DIAGNOSIS — N529 Male erectile dysfunction, unspecified: Secondary | ICD-10-CM | POA: Diagnosis not present

## 2023-07-18 DIAGNOSIS — E7800 Pure hypercholesterolemia, unspecified: Secondary | ICD-10-CM | POA: Diagnosis not present

## 2023-07-18 DIAGNOSIS — R634 Abnormal weight loss: Secondary | ICD-10-CM | POA: Diagnosis not present

## 2023-07-18 DIAGNOSIS — I10 Essential (primary) hypertension: Secondary | ICD-10-CM | POA: Diagnosis not present

## 2023-07-18 DIAGNOSIS — I959 Hypotension, unspecified: Secondary | ICD-10-CM | POA: Diagnosis not present

## 2023-07-18 NOTE — Telephone Encounter (Signed)
-----   Message from Naaman Plummer sent at 07/17/2023 11:15 AM EDT ----- This is likely out of my comfort zone to do a good study, there was another referral sent from this office today for Jacquelyne Balint, MD at [facility], that is the appropriate consultation. ----- Message ----- From: Juanda Chance, NP Sent: 07/17/2023  11:04 AM EDT To: Tyrell Antonio, MD  Looks like request of upper and lower extremity nerve study. I do not think we have seen him before. ----- Message ----- From: Carrolyn Meiers, CMA Sent: 07/17/2023  10:30 AM EDT To: Tyrell Antonio, MD; Juanda Chance, NP   ----- Message ----- From: Dorcas Mcmurray Sent: 07/17/2023  10:17 AM EDT To: Carrolyn Meiers, CMA

## 2023-07-18 NOTE — Progress Notes (Unsigned)
 Initial neurology clinic note  Reason for Evaluation: Consultation requested by Emilio Aspen, * for an opinion regarding progressive weakness. My final recommendations will be communicated back to the requesting physician by way of shared medical record or letter to requesting physician via [place] mail.  HPI: This is [name], a 83 y.o. right-handed male with a medical history of HTN, OSA, hearing loss, HLD, depression who presents to neurology clinic with the chief complaint of generalized weakness. The patient is accompanied by wife.  Per patient's wife, patient has been slowly down the last few years. Wife notes a sedentary lifestyle and lack of exercise for a while. He seems to have moved slower for the last few years and not been able to get around as well as prior.  Around Christmas of 2024, patient had a respiratory illness and more acute change. Patient had a cough and had difficulty recovering. He had to get a prescription cough medication from Dr. Nehemiah Settle. A couple weeks later, patient called his wife to bed stating he could not stand. Wife helped him stand and got his balance. Wife took patient to urgent care on 05/06/23. He was able to slowly walk unaided at that time. His legs were starting to hurt and be weak. He mentioned thigh pain. It was an achy, muscle like pain. At that time, the concern for physical deconditioning. He did get a lumbar xray that reportedly did not show anything.  Patient then saw Dr. Luis Abed at [facility] 05/26/23. He had a fall in the parking lot that day going into the appointment. The thought again was deconditioning, so PT was recommended. He was also recommended to use a walker, which he started using on 05/26/23. He started this on 06/01/23 (twice weekly). There may have been some mild improvement, but patient was experience some pain, so missed some appointments. Patient is not sure he was getting better and was discouraged.  He  then went back to PCP (Dr. Orson Aloe) 06/16/23. There was a lot of blood work done. Per provided referral notes, patient had a normal CBC, CMP, CK, ESR, B12, TSH, UA. He also had reduction of BP meds as his BP was low.  Patient continued PT but then still seemed to get worse weakness and was very sore. He had another fall on 06/23/23. Wife could not get him up alone. EMS was called. There was concern for afib by EMS. He had labs, CTH, and CTA head and neck. His testing was significant for moderate to severe foraminal stenosis bilaterally at C2-3, C3-4 and C4-5 seen on CTA head and neck, but otherwise was unremarkable. He was discharged home with home health care who started seeing patient on 07/01/23. He is now getting home therapy. It is unclear if this is helping, but patient is having a lot of pain and fatigue due to the therapy. Therapist has also noticed afib. Patient continued to get weaker, so he now has a hospital bed, cannot stand up, and has to use a walker since 07/2023. He has difficulty rolling over in bed.  Patient was referred to cardiology but will not see them until 10/2023. He is scheduled for MRI cervical spine, lumbar spine, and echocardiogram on 07/28/23.  Patient endorses pain in thighs but no numbness or tingling. He denies pain in arms until today when his left arm hurt (which may be due to overuse be wife). He has difficulty lifting the left arm but not the right.  The patient denies  symptoms suggestive of oculobulbar weakness including diplopia, ptosis, dysphagia, poor saliva control, dysarthria/dysphonia, impaired mastication, facial weakness/droop.  There are no neuromuscular respiratory weakness symptoms, particularly orthopnea>dyspnea. He does have untreated OSA (no CPAP since 04/2023).   Pseudobulbar affect is absent. He is more agitated and frustrated than normal. Patient has expressed being down and not feeling he is going to get better and is going to die. He has not expressed  SI. There is no access to weapons to hurt himself.  He is losing weight because he is not eating. He does not think there is any taste in food. He is drinking 3 boosts per day. There is no concern for fevers or night sweats.  Patient has HLD but does not recall ever being on lipid lowering medications, specifically statins.  EtOH use: None  Family history of neuropathy/myopathy/NM/neurologic disease? no   MEDICATIONS:  Outpatient Encounter Medications as of 07/21/2023  Medication Sig   hydrochlorothiazide (HYDRODIURIL) 25 MG tablet Take 12.5 mg by mouth at bedtime. (Patient not taking: Reported on 07/21/2023)   lisinopril (ZESTRIL) 10 MG tablet Take 10 mg by mouth daily. (Patient not taking: Reported on 07/21/2023)   sertraline (ZOLOFT) 25 MG tablet Take 25 mg by mouth daily. (Patient not taking: Reported on 06/23/2023)   No facility-administered encounter medications on file as of 07/21/2023.    PAST MEDICAL HISTORY: Past Medical History:  Diagnosis Date   Hearing impaired person, bilateral    wears hearing aids   Hypertension     PAST SURGICAL HISTORY: Past Surgical History:  Procedure Laterality Date   APPENDECTOMY     laparoscopic   CATARACT EXTRACTION, BILATERAL     COLONOSCOPY WITH PROPOFOL N/A 03/07/2016   Procedure: COLONOSCOPY WITH PROPOFOL;  Surgeon: Charolett Bumpers, MD;  Location: [facility];  Service: Endoscopy;  Laterality: N/A;   VASECTOMY      ALLERGIES: No Known Allergies  FAMILY HISTORY: History reviewed. No pertinent family history.  SOCIAL HISTORY: Social History   Tobacco Use   Smoking status: Never   Smokeless tobacco: Never  Vaping Use   Vaping status: Never Used  Substance Use Topics   Alcohol use: No   Drug use: No   Social History   Social History Narrative   Are you right handed or left handed? Right   Are you currently employed ?    What is your current occupation? retired   Do you live at home alone?   Who lives with you? wife    What type of home do you live in: 1 story or 2 story? Two .. Main living on one    Caffiene 2 a day     OBJECTIVE: PHYSICAL EXAM: BP 113/70   Pulse 87   Ht 5\' 10"  (1.778 m)   SpO2 98%   BMI 25.11 kg/m   General: General appearance: Awake and alert. No distress. Thin. Cooperative with exam.  Skin: No obvious rash or jaundice. HEENT: Atraumatic. Anicteric. Lungs: Non-labored breathing on room air  Extremities: No edema. Psych: Flat affect.  Neurological: Mental Status: Alert. Speech fluent. No pseudobulbar affect Cranial Nerves: CNII: No RAPD. Visual fields grossly intact. CNIII, IV, VI: PERRL. No nystagmus. EOMI. No diplopia with sustained up gaze CN V: Facial sensation intact bilaterally to fine touch. Jaw jerk is negative. CN VII: Facial muscles: Orbicularis oculi weak bilaterally. Orbicularis oris strong. No ptosis at rest or after sustained upgaze. CN VIII: Hearing grossly intact bilaterally. CN IX: No hypophonia. CN X: Palate elevates  symmetrically. CN XI: Full strength shoulder shrug bilaterally. CN XII: Tongue protrusion full and midline. No atrophy or fasciculations. No significant dysarthria Motor: Tone is normal in RUE, flaccid in LUE and bilateral lower extremities. Positive for mild fasciculations in all extremities. Mild atrophy proximal > distal.  Individual muscle group testing (MRC grade out of 5):  Movement     Neck flexion 5    Neck extension 5     Right Left   Shoulder abduction 4- 4-   Elbow flexion 4- 4-   Elbow extension 4- 4-   Finger abduction - FDI 5- 5-   Finger abduction - ADM 5- 5-   Finger extension 5- 5-   Finger distal flexion - 2/3 4 4    Finger distal flexion - 4/5 4 4    Thumb flexion - FPL 4- 4-   Thumb abduction - APB 4 4    Hip flexion 1 1   Hip extension 2 2   Hip adduction 4 4   Hip abduction 4+ 4+   Knee extension 1 1   Knee flexion 1 1   Dorsiflexion 4 4   Plantarflexion 4+ 4+    Reflexes:  Right Left    Bicep 0 0   Tricep 0 0   BrRad 0 0   Knee 0 0   Ankle 0 0    Pathological Reflexes: Babinski: mute response bilaterally Hoffman: absent bilaterally Troemner: absent bilaterally Facial reflex absent bilaterally Midline tap absent Sensation: Pinprick: Intact in all extremities Vibration: Intact in all extremities Proprioception: Unclear if intact. Coordination: Difficulty to test due to weakness Gait: Unable to ambulate  Lab and Test Review: Internal labs: 06/23/23: CBC w/ diff: unremarkable BMP significant for K 3.1, BUN 37, Cr 1.35 UA unremarkable  Imaging/Procedures: CT head wo contrast (06/23/23): FINDINGS: Brain: No acute intracranial hemorrhage. No CT evidence of acute infarct. Nonspecific hypoattenuation in the periventricular and subcortical white matter favored to reflect chronic microvascular ischemic changes. No edema, mass effect, or midline shift. The basilar cisterns are patent.   Ventricles: Prominence of the ventricles suggesting underlying parenchymal volume loss.   Vascular: There is focal hyperdensity in the right aspect of the suprasellar cistern in the expected location of the right ICA terminus. Atherosclerotic calcification of the carotid siphons.   Skull: No acute or aggressive finding.   Orbits: Orbits are symmetric.   Sinuses: The visualized paranasal sinuses are clear.   Other: Mastoid air cells are clear.   IMPRESSION: 1. Focal hyperdensity of the right ICA terminus. Recommend CTA for further evaluation. 2. No acute intracranial hemorrhage, mass effect, or CT evidence of acute infarct. 3. Chronic microvascular ischemic changes and parenchymal volume loss.  CTA head and neck (06/23/23): FINDINGS: CTA NECK FINDINGS   Aortic arch: Minimal calcification is present in the proximal descending thoracic aorta and distal arch. Great vessel origins are unremarkable. No aneurysm or stenosis is present. No dissection is present.   Right  carotid system: The right common carotid artery is within normal limits. Bifurcation is unremarkable. The cervical right ICA is normal.   Left carotid system: Left common carotid artery is within normal limits. The bifurcation is unremarkable. The cervical left ICA is normal.   Vertebral arteries: The vertebral arteries are codominant. Both vertebral arteries originate from the subclavian arteries without significant stenosis. No significant stenosis is present in either vertebral artery in the neck.   Skeleton: Multilevel degenerative changes are present in the cervical spine. Uncovertebral and facet hypertrophy leads to  moderate to severe foraminal stenosis bilaterally at C2-3, C3-4 and C4-5. No focal osseous lesions are present.   Other neck: Soft tissues the neck are otherwise unremarkable. Salivary glands are within normal limits. Thyroid is normal. No significant adenopathy is present. No focal mucosal or submucosal lesions are present.   Upper chest: The lung apices are clear. The thoracic inlet is within normal limits.   Review of the MIP images confirms the above findings   CTA HEAD FINDINGS   Anterior circulation: The internal carotid arteries are within normal limits from the skull base to the ICA termini. The M1 segments are normal. The right A1 segment is dominant. The anterior communicating artery is patent. The MCA bifurcations are normal bilaterally. The ACA and MCA branch vessels are normal bilaterally. No aneurysm is present.   Posterior circulation: PICA origins are visualized and normal. The vertebrobasilar junction and basilar artery are normal. The superior cerebellar arteries are patent bilaterally. The right posterior cerebral artery originates from basilar tip. The left posterior cerebral artery is of fetal type. The PCA branch vessels are normal bilaterally.   Venous sinuses: The dural sinuses are patent. The straight sinus and deep cerebral  veins are intact. Cortical veins are within normal limits. No significant vascular malformation is evident.   Anatomic variants: Fetal type left posterior cerebral artery.   Review of the MIP images confirms the above findings   IMPRESSION: 1. Normal CTA of the head and neck. No large vessel occlusion or hemodynamically significant stenosis. 2. Multilevel degenerative changes in the cervical spine. 3. Moderate to severe foraminal stenosis bilaterally at C2-3, C3-4 and C4-5.  MRI brain w/wo contrast (04/07/21): FINDINGS: Brain:   Cerebral volume appears normal for age.   Mild multifocal T2 FLAIR hyperintense signal abnormality within the cerebral white matter, nonspecific but compatible with chronic small vessel ischemic disease.   No evidence of an intracranial mass. Specifically, no cerebellopontine angle or internal auditory canal mass is demonstrated. Unremarkable appearance of the 7th and 8th cranial nerves bilaterally.   There is no acute infarct.   No chronic intracranial blood products.   No extra-axial fluid collection.   No midline shift.   No pathologic intracranial enhancement identified.   Vascular: Maintained flow voids within the proximal large arterial vessels.   Skull and upper cervical spine: No focal suspicious marrow lesion.   Sinuses/Orbits: Visualized orbits show no acute finding. Bilateral lens replacements. Minimal mucosal thickening within the bilateral ethmoid and right maxillary sinuses.   Other: Trace fluid within the left mastoid air cells.   IMPRESSION: No evidence of acute intracranial abnormality.   No cerebellopontine angle or internal auditory canal mass.   Mild chronic small vessel ischemic changes within the cerebral white matter.   Minimal mucosal thickening within the bilateral ethmoid and right maxillary sinuses.   Trace fluid within left mastoid air cells.  ASSESSMENT: Peter Stone is a 83 y.o. male who presents  for evaluation of progressive weakness. He has a relevant medical history of HTN, OSA, hearing loss, HLD, depression. His neurological examination is pertinent for profound weakness both proximally and distally, areflexia, but seemingly normal sensation. Per provided referral notes, patient had a normal CBC, CMP, CK, ESR, B12, TSH, UA. CT head showed no acute process. CTA head and neck showed no LVO or significant stenosis but did mention foraminal stenosis (moderate to severe foraminal stenosis bilaterally at C2-3, C3-4, and C4-5). Patient's symptoms are very concerning but of unclear etiology. The differential diagnosis including cervical stenosis,  CIDP, motor neuron disease (ALS), myasthenia gravis, myopathy (query autoimmune, inclusion body myositis). I would like patient to get MRIs of cervical and lumbar spine as planned and EMG ASAP so that I can plan future testing or treatment.  PLAN: -Blood work: CK, MG panel -EMG: MND (L arm and leg) on 07/31/23 at 11 am -Follow up on MRI cervical and lumbar spine w/wo contrast on 07/28/23  -Return to clinic on 08/03/23 at 11 am  The impression above as well as the plan as outlined below were extensively discussed with the patient (in the company of wife) who voiced understanding. All questions were answered to their satisfaction.  The patient was counseled on pertinent fall precautions per the printed material provided today, and as noted under the "Patient Instructions" section below.  When available, results of the above investigations and possible further recommendations will be communicated to the patient via telephone/MyChart. Patient to call office if not contacted after expected testing turnaround time.   Total time spent reviewing records, interview, history/exam, documentation, and coordination of care on day of encounter:  90 min   Thank you for allowing me to participate in patient's care.  If I can answer any additional questions, I would be  pleased to do so.  Jacquelyne Balint, MD   CC: Emilio Aspen, MD [address] [place] [address]  CC: Referring provider: Emilio Aspen, MD [address] [place],  [place] [address]

## 2023-07-18 NOTE — Telephone Encounter (Signed)
 Received a call from [facility] about this patient and asking about when he would be scheduled"we advised tha per Dr. Alvester Morin he feels patient would be better suited to receive care with Dr Noreene Larsson at [place]. Call advised that it was a STAT EMG- Araceli Bouche, advised that we sent it Back the [facility] due to what Dr. Alvester Morin advised. Caller advised that they have been waiting on this since Friday March 14,2025. We received the referral Monday March 17th, 2025 in our referral Q and responded quickly. Attached is the message from Dr. Alvester Morin..  the caller from [place], very angry. IByrd Hesselbach Smith-CMA) spoke with Morrie Sheldon and she advised what was going on and I responded what Morrie Sheldon advised.--Morrie Sheldon

## 2023-07-19 DIAGNOSIS — R634 Abnormal weight loss: Secondary | ICD-10-CM | POA: Diagnosis not present

## 2023-07-19 DIAGNOSIS — Z6823 Body mass index (BMI) 23.0-23.9, adult: Secondary | ICD-10-CM | POA: Diagnosis not present

## 2023-07-19 DIAGNOSIS — H9193 Unspecified hearing loss, bilateral: Secondary | ICD-10-CM | POA: Diagnosis not present

## 2023-07-19 DIAGNOSIS — I959 Hypotension, unspecified: Secondary | ICD-10-CM | POA: Diagnosis not present

## 2023-07-19 DIAGNOSIS — I10 Essential (primary) hypertension: Secondary | ICD-10-CM | POA: Diagnosis not present

## 2023-07-19 DIAGNOSIS — F32A Depression, unspecified: Secondary | ICD-10-CM | POA: Diagnosis not present

## 2023-07-19 DIAGNOSIS — N529 Male erectile dysfunction, unspecified: Secondary | ICD-10-CM | POA: Diagnosis not present

## 2023-07-19 DIAGNOSIS — G4733 Obstructive sleep apnea (adult) (pediatric): Secondary | ICD-10-CM | POA: Diagnosis not present

## 2023-07-19 DIAGNOSIS — E7800 Pure hypercholesterolemia, unspecified: Secondary | ICD-10-CM | POA: Diagnosis not present

## 2023-07-20 DIAGNOSIS — G4733 Obstructive sleep apnea (adult) (pediatric): Secondary | ICD-10-CM | POA: Diagnosis not present

## 2023-07-20 DIAGNOSIS — Z6823 Body mass index (BMI) 23.0-23.9, adult: Secondary | ICD-10-CM | POA: Diagnosis not present

## 2023-07-20 DIAGNOSIS — N529 Male erectile dysfunction, unspecified: Secondary | ICD-10-CM | POA: Diagnosis not present

## 2023-07-20 DIAGNOSIS — E7800 Pure hypercholesterolemia, unspecified: Secondary | ICD-10-CM | POA: Diagnosis not present

## 2023-07-20 DIAGNOSIS — I959 Hypotension, unspecified: Secondary | ICD-10-CM | POA: Diagnosis not present

## 2023-07-20 DIAGNOSIS — R634 Abnormal weight loss: Secondary | ICD-10-CM | POA: Diagnosis not present

## 2023-07-20 DIAGNOSIS — I10 Essential (primary) hypertension: Secondary | ICD-10-CM | POA: Diagnosis not present

## 2023-07-20 DIAGNOSIS — H9193 Unspecified hearing loss, bilateral: Secondary | ICD-10-CM | POA: Diagnosis not present

## 2023-07-20 DIAGNOSIS — F32A Depression, unspecified: Secondary | ICD-10-CM | POA: Diagnosis not present

## 2023-07-21 ENCOUNTER — Encounter: Payer: Self-pay | Admitting: Neurology

## 2023-07-21 ENCOUNTER — Ambulatory Visit: Admitting: Neurology

## 2023-07-21 ENCOUNTER — Other Ambulatory Visit

## 2023-07-21 VITALS — BP 113/70 | HR 87 | Ht 70.0 in

## 2023-07-21 DIAGNOSIS — R253 Fasciculation: Secondary | ICD-10-CM | POA: Diagnosis not present

## 2023-07-21 DIAGNOSIS — M6281 Muscle weakness (generalized): Secondary | ICD-10-CM

## 2023-07-21 DIAGNOSIS — Z993 Dependence on wheelchair: Secondary | ICD-10-CM

## 2023-07-21 DIAGNOSIS — M79651 Pain in right thigh: Secondary | ICD-10-CM | POA: Diagnosis not present

## 2023-07-21 DIAGNOSIS — M79652 Pain in left thigh: Secondary | ICD-10-CM | POA: Diagnosis not present

## 2023-07-21 NOTE — Patient Instructions (Addendum)
 I saw you today for muscle weakness. I am concerned this a neurologic condition.  I would like to investigate your symptoms further with the following: -Blood work today -Muscle and nerve test called EMG (see more information below) on 07/31/23 at 11 am  I want you to get the MRIs as planned on 07/28/23.  I will plan a follow up to discuss results on 08/03/23 at 11 am. If something changes and we need more testing, I will be in touch and we can change this appointment.  The physicians and staff at [facility] are committed to providing excellent care. You may receive a survey requesting feedback about your experience at our office. We strive to receive "very good" responses to the survey questions. If you feel that your experience would prevent you from giving the office a "very good " response, please contact our office to try to remedy the situation. We may be reached at [phone]. Thank you for taking the time out of your busy day to complete the survey.  Jacquelyne Balint, MD [facility]  ELECTROMYOGRAM AND NERVE CONDUCTION STUDIES (EMG/NCS) INSTRUCTIONS  How to Prepare The neurologist conducting the EMG will need to know if you have certain medical conditions. Tell the neurologist and other EMG lab personnel if you: Have a pacemaker or any other electrical medical device Take blood-thinning medications Have hemophilia, a blood-clotting disorder that causes prolonged bleeding Bathing Take a shower or bath shortly before your exam in order to remove oils from your skin. Don't apply lotions or creams before the exam.  What to Expect You'll likely be asked to change into a hospital gown for the procedure and lie down on an examination table. The following explanations can help you understand what will happen during the exam.  Electrodes. The neurologist or a technician places surface electrodes at various locations on your skin depending on where you're experiencing symptoms. Or the  neurologist may insert needle electrodes at different sites depending on your symptoms.  Sensations. The electrodes will at times transmit a tiny electrical current that you may feel as a twinge or spasm. The needle electrode may cause discomfort or pain that usually ends shortly after the needle is removed. If you are concerned about discomfort or pain, you may want to talk to the neurologist about taking a short break during the exam.  Instructions. During the needle EMG, the neurologist will assess whether there is any spontaneous electrical activity when the muscle is at rest - activity that isn't present in healthy muscle tissue - and the degree of activity when you slightly contract the muscle.  He or she will give you instructions on resting and contracting a muscle at appropriate times. Depending on what muscles and nerves the neurologist is examining, he or she may ask you to change positions during the exam.  After your EMG You may experience some temporary, minor bruising where the needle electrode was inserted into your muscle. This bruising should fade within several days. If it persists, contact your primary care doctor.   Preventing Falls at [facility] are common, often dreaded events in the lives of older people. Aside from the obvious injuries and even death that may result, fall can cause wide-ranging consequences including loss of independence, mental decline, decreased activity and mobility. Younger people are also at risk of falling, especially those with chronic illnesses and fatigue.  Ways to reduce risk for falling Examine diet and medications. Warm foods and alcohol dilate blood vessels, which  can lead to dizziness when standing. Sleep aids, antidepressants and pain medications can also increase the likelihood of a fall.  Get a vision exam. Poor vision, cataracts and glaucoma increase the chances of falling.  Check foot gear. Shoes should fit snugly and have a sturdy,  nonskid sole and a broad, low heel  Participate in a physician-approved exercise program to build and maintain muscle strength and improve balance and coordination. Programs that use ankle weights or stretch bands are excellent for muscle-strengthening. Water aerobics programs and low-impact Tai Chi programs have also been shown to improve balance and coordination.  Increase vitamin D intake. Vitamin D improves muscle strength and increases the amount of calcium the body is able to absorb and deposit in bones.  How to prevent falls from common hazards Floors - Remove all loose wires, cords, and throw rugs. Minimize clutter. Make sure rugs are anchored and smooth. Keep furniture in its usual place.  Chairs -- Use chairs with straight backs, armrests and firm seats. Add firm cushions to existing pieces to add height.  Bathroom - Install grab bars and non-skid tape in the tub or shower. Use a bathtub transfer bench or a shower chair with a back support Use an elevated toilet seat and/or safety rails to assist standing from a low surface. Do not use towel racks or bathroom tissue holders to help you stand.  Lighting - Make sure halls, stairways, and entrances are well-lit. Install a night light in your bathroom or hallway. Make sure there is a light switch at the top and bottom of the staircase. Turn lights on if you get up in the middle of the night. Make sure lamps or light switches are within reach of the bed if you have to get up during the night.  Kitchen - Install non-skid rubber mats near the sink and stove. Clean spills immediately. Store frequently used utensils, pots, pans between waist and eye level. This helps prevent reaching and bending. Sit when getting things out of lower cupboards.  Living room/ Bedrooms - Place furniture with wide spaces in between, giving enough room to move around. Establish a route through the living room that gives you something to hold onto as you walk.  Stairs  - Make sure treads, rails, and rugs are secure. Install a rail on both sides of the stairs. If stairs are a threat, it might be helpful to arrange most of your activities on the lower level to reduce the number of times you must climb the stairs.  Entrances and doorways - Install metal handles on the walls adjacent to the doorknobs of all doors to make it more secure as you travel through the doorway.  Tips for maintaining balance Keep at least one hand free at all times. Try using a backpack or fanny pack to hold things rather than carrying them in your hands. Never carry objects in both hands when walking as this interferes with keeping your balance.  Attempt to swing both arms from front to back while walking. This might require a conscious effort if Parkinson's disease has diminished your movement. It will, however, help you to maintain balance and posture, and reduce fatigue.  Consciously lift your feet off of the ground when walking. Shuffling and dragging of the feet is a common culprit in losing your balance.  When trying to navigate turns, use a "U" technique of facing forward and making a wide turn, rather than pivoting sharply.  Try to stand with your feet shoulder-length  apart. When your feet are close together for any length of time, you increase your risk of losing your balance and falling.  Do one thing at a time. Don't try to walk and accomplish another task, such as reading or looking around. The decrease in your automatic reflexes complicates motor function, so the less distraction, the better.  Do not wear rubber or gripping soled shoes, they might "catch" on the floor and cause tripping.  Move slowly when changing positions. Use deliberate, concentrated movements and, if needed, use a grab bar or walking aid. Count 15 seconds between each movement. For example, when rising from a seated position, wait 15 seconds after standing to begin walking.  If balance is a continuous  problem, you might want to consider a walking aid such as a cane, walking stick, or walker. Once you've mastered walking with help, you might be ready to try it on your own again.

## 2023-07-24 ENCOUNTER — Telehealth: Payer: Self-pay | Admitting: Neurology

## 2023-07-24 DIAGNOSIS — N529 Male erectile dysfunction, unspecified: Secondary | ICD-10-CM | POA: Diagnosis not present

## 2023-07-24 DIAGNOSIS — I959 Hypotension, unspecified: Secondary | ICD-10-CM | POA: Diagnosis not present

## 2023-07-24 DIAGNOSIS — F32A Depression, unspecified: Secondary | ICD-10-CM | POA: Diagnosis not present

## 2023-07-24 DIAGNOSIS — E7800 Pure hypercholesterolemia, unspecified: Secondary | ICD-10-CM | POA: Diagnosis not present

## 2023-07-24 DIAGNOSIS — I10 Essential (primary) hypertension: Secondary | ICD-10-CM | POA: Diagnosis not present

## 2023-07-24 DIAGNOSIS — H9193 Unspecified hearing loss, bilateral: Secondary | ICD-10-CM | POA: Diagnosis not present

## 2023-07-24 DIAGNOSIS — R634 Abnormal weight loss: Secondary | ICD-10-CM | POA: Diagnosis not present

## 2023-07-24 DIAGNOSIS — G4733 Obstructive sleep apnea (adult) (pediatric): Secondary | ICD-10-CM | POA: Diagnosis not present

## 2023-07-24 DIAGNOSIS — Z6823 Body mass index (BMI) 23.0-23.9, adult: Secondary | ICD-10-CM | POA: Diagnosis not present

## 2023-07-24 NOTE — Telephone Encounter (Signed)
 Pt has an upcoming EMG appointment. The pt's wife called in stating directions state the pt needs to be able to change into a gown and lie on table. She stated the pt is in a wheelchair and cannot do that. She wants to make sure that is ok?

## 2023-07-25 DIAGNOSIS — R531 Weakness: Secondary | ICD-10-CM | POA: Diagnosis not present

## 2023-07-25 DIAGNOSIS — R54 Age-related physical debility: Secondary | ICD-10-CM | POA: Diagnosis not present

## 2023-07-25 DIAGNOSIS — R269 Unspecified abnormalities of gait and mobility: Secondary | ICD-10-CM | POA: Diagnosis not present

## 2023-07-25 DIAGNOSIS — R296 Repeated falls: Secondary | ICD-10-CM | POA: Diagnosis not present

## 2023-07-25 DIAGNOSIS — G4733 Obstructive sleep apnea (adult) (pediatric): Secondary | ICD-10-CM | POA: Diagnosis not present

## 2023-07-26 DIAGNOSIS — F32A Depression, unspecified: Secondary | ICD-10-CM | POA: Diagnosis not present

## 2023-07-26 DIAGNOSIS — N529 Male erectile dysfunction, unspecified: Secondary | ICD-10-CM | POA: Diagnosis not present

## 2023-07-26 DIAGNOSIS — H9193 Unspecified hearing loss, bilateral: Secondary | ICD-10-CM | POA: Diagnosis not present

## 2023-07-26 DIAGNOSIS — E7800 Pure hypercholesterolemia, unspecified: Secondary | ICD-10-CM | POA: Diagnosis not present

## 2023-07-26 DIAGNOSIS — I959 Hypotension, unspecified: Secondary | ICD-10-CM | POA: Diagnosis not present

## 2023-07-26 DIAGNOSIS — G4733 Obstructive sleep apnea (adult) (pediatric): Secondary | ICD-10-CM | POA: Diagnosis not present

## 2023-07-26 DIAGNOSIS — Z6823 Body mass index (BMI) 23.0-23.9, adult: Secondary | ICD-10-CM | POA: Diagnosis not present

## 2023-07-26 DIAGNOSIS — I10 Essential (primary) hypertension: Secondary | ICD-10-CM | POA: Diagnosis not present

## 2023-07-26 DIAGNOSIS — R634 Abnormal weight loss: Secondary | ICD-10-CM | POA: Diagnosis not present

## 2023-07-26 NOTE — Telephone Encounter (Signed)
 Pt's wife called back in wanting to see if the pt could be admitted for him not being able to stand since they have several appointments to get to coming up?

## 2023-07-26 NOTE — Progress Notes (Deleted)
 I saw Peter Stone in neurology clinic on 08/03/23 in follow up for progressive weakness.  HPI: Peter Stone is a 83 y.o. year old male with a history of HTN, OSA, hearing loss, HLD, depression who we last saw on 07/21/23.  To briefly review: Per patient's wife, patient has been slowly down the last few years. Wife notes a sedentary lifestyle and lack of exercise for a while. He seems to have moved slower for the last few years and not been able to get around as well as prior.   Around Christmas of 2024, patient had a respiratory illness and more acute change. Patient had a cough and had difficulty recovering. He had to get a prescription cough medication from Dr. Nehemiah Settle. A couple weeks later, patient called his wife to bed stating he could not stand. Wife helped him stand and got his balance. Wife took patient to urgent care on 05/06/23. He was able to slowly walk unaided at that time. His legs were starting to hurt and be weak. He mentioned thigh pain. It was an achy, muscle like pain. At that time, the concern for physical deconditioning. He did get a lumbar xray that reportedly did not show anything.   Patient then saw Dr. Luis Abed at [facility] 05/26/23. He had a fall in the parking lot that day going into the appointment. The thought again was deconditioning, so PT was recommended. He was also recommended to use a walker, which he started using on 05/26/23. He started this on 06/01/23 (twice weekly). There may have been some mild improvement, but patient was experience some pain, so missed some appointments. Patient is not sure he was getting better and was discouraged.   He then went back to PCP (Dr. Orson Aloe) 06/16/23. There was a lot of blood work done. Per provided referral notes, patient had a normal CBC, CMP, CK, ESR, B12, TSH, UA. He also had reduction of BP meds as his BP was low.   Patient continued PT but then still seemed to get worse weakness and was very sore. He had  another fall on 06/23/23. Wife could not get him up alone. EMS was called. There was concern for afib by EMS. He had labs, CTH, and CTA head and neck. His testing was significant for moderate to severe foraminal stenosis bilaterally at C2-3, C3-4 and C4-5 seen on CTA head and neck, but otherwise was unremarkable. He was discharged home with home health care who started seeing patient on 07/01/23. He is now getting home therapy. It is unclear if this is helping, but patient is having a lot of pain and fatigue due to the therapy. Therapist has also noticed afib. Patient continued to get weaker, so he now has a hospital bed, cannot stand up, and has to use a walker since 07/2023. He has difficulty rolling over in bed.   Patient was referred to cardiology but will not see them until 10/2023. He is scheduled for MRI cervical spine, lumbar spine, and echocardiogram on 07/28/23.   Patient endorses pain in thighs but no numbness or tingling. He denies pain in arms until today when his left arm hurt (which may be due to overuse be wife). He has difficulty lifting the left arm but not the right.   The patient denies symptoms suggestive of oculobulbar weakness including diplopia, ptosis, dysphagia, poor saliva control, dysarthria/dysphonia, impaired mastication, facial weakness/droop.   There are no neuromuscular respiratory weakness symptoms, particularly orthopnea>dyspnea. He does have untreated OSA (no CPAP  since 04/2023).    Pseudobulbar affect is absent. He is more agitated and frustrated than normal. Patient has expressed being down and not feeling he is going to get better and is going to die. He has not expressed SI. There is no access to weapons to hurt himself.   He is losing weight because he is not eating. He does not think there is any taste in food. He is drinking 3 boosts per day. There is no concern for fevers or night sweats.   Patient has HLD but does not recall ever being on lipid lowering  medications, specifically statins.   EtOH use: None  Family history of neuropathy/myopathy/NM/neurologic disease? no  Most recent Assessment and Plan (07/21/23): Peter Stone is a 83 y.o. male who presents for evaluation of progressive weakness. He has a relevant medical history of HTN, OSA, hearing loss, HLD, depression. His neurological examination is pertinent for profound weakness both proximally and distally, areflexia, but seemingly normal sensation. Per provided referral notes, patient had a normal CBC, CMP, CK, ESR, B12, TSH, UA. CT head showed no acute process. CTA head and neck showed no LVO or significant stenosis but did mention foraminal stenosis (moderate to severe foraminal stenosis bilaterally at C2-3, C3-4, and C4-5). Patient's symptoms are very concerning but of unclear etiology. The differential diagnosis including cervical stenosis, CIDP, motor neuron disease (ALS), myasthenia gravis, myopathy (query autoimmune, inclusion body myositis). I would like patient to get MRIs of cervical and lumbar spine as planned and EMG ASAP so that I can plan future testing or treatment.   PLAN: -Blood work: CK, MG panel -EMG: MND (L arm and leg) on 07/31/23 at 11 am -Follow up on MRI cervical and lumbar spine w/wo contrast on 07/28/23  Since their last visit: CK and MG panel were normal.  MRI cervical and lumbar spine on 07/28/23 showed***  EMG on 07/31/23 showed ***.  Patient is here today to discuss results of testing***  ROS: Pertinent positive and negative systems reviewed in HPI. ***   MEDICATIONS:  Outpatient Encounter Medications as of 08/03/2023  Medication Sig   acetaminophen (TYLENOL) 325 MG tablet Take 650 mg by mouth every 12 (twelve) hours as needed.   cetirizine (ZYRTEC) 10 MG chewable tablet Chew 10 mg by mouth daily.   cholecalciferol (VITAMIN D3) 25 MCG (1000 UNIT) tablet Take 1,000 Units by mouth daily.   hydrochlorothiazide (HYDRODIURIL) 25 MG tablet Take 12.5 mg by  mouth at bedtime. (Patient not taking: Reported on 07/21/2023)   ibuprofen (ADVIL) 200 MG tablet Take 400 mg by mouth every 12 (twelve) hours as needed.   lisinopril (ZESTRIL) 10 MG tablet Take 10 mg by mouth daily. (Patient not taking: Reported on 07/21/2023)   Multiple Vitamins-Minerals (MENS MULTIPLUS PO) Take 1 tablet by mouth daily.   sertraline (ZOLOFT) 25 MG tablet Take 25 mg by mouth daily. (Patient not taking: Reported on 06/23/2023)   No facility-administered encounter medications on file as of 08/03/2023.    PAST MEDICAL HISTORY: Past Medical History:  Diagnosis Date   Hearing impaired person, bilateral    wears hearing aids   Hypertension     PAST SURGICAL HISTORY: Past Surgical History:  Procedure Laterality Date   APPENDECTOMY     laparoscopic   CATARACT EXTRACTION, BILATERAL     COLONOSCOPY WITH PROPOFOL N/A 03/07/2016   Procedure: COLONOSCOPY WITH PROPOFOL;  Surgeon: Charolett Bumpers, MD;  Location: [facility];  Service: Endoscopy;  Laterality: N/A;   VASECTOMY  ALLERGIES: No Known Allergies  FAMILY HISTORY: No family history on file.  SOCIAL HISTORY: Social History   Tobacco Use   Smoking status: Never   Smokeless tobacco: Never  Vaping Use   Vaping status: Never Used  Substance Use Topics   Alcohol use: No   Drug use: No   Social History   Social History Narrative   Are you right handed or left handed? Right   Are you currently employed ?    What is your current occupation? retired   Do you live at home alone?   Who lives with you? wife   What type of home do you live in: 1 story or 2 story? Two .. Main living on one    Caffiene 2 a day    Objective:  Vital Signs:  There were no vitals taken for this visit.  General:*** General appearance: Awake and alert. No distress. Cooperative with exam.  Skin: No obvious rash or jaundice. HEENT: Atraumatic. Anicteric. Lungs: Non-labored breathing on room air  Heart: Regular Abdomen: Soft, non  tender. Extremities: No edema. No obvious deformity.  Musculoskeletal: No obvious joint swelling.  Neurological: Mental Status: Alert. Speech fluent. No pseudobulbar affect Cranial Nerves: CNII: No RAPD. Visual fields intact. CNIII, IV, VI: PERRL. No nystagmus. EOMI. CN V: Facial sensation intact bilaterally to fine touch. Masseter clench strong. Jaw jerk***. CN VII: Facial muscles symmetric and strong. No ptosis at rest or after sustained upgaze***. CN VIII: Hears finger rub well bilaterally. CN IX: No hypophonia. CN X: Palate elevates symmetrically. CN XI: Full strength shoulder shrug bilaterally. CN XII: Tongue protrusion full and midline. No atrophy or fasciculations. No significant dysarthria*** Motor: Tone is ***. *** fasciculations in *** extremities. *** atrophy. No grip or percussive myotonia.  Individual muscle group testing (MRC grade out of 5):  Movement     Neck flexion ***    Neck extension ***     Right Left   Shoulder abduction *** ***   Shoulder adduction *** ***   Shoulder ext rotation *** ***   Shoulder int rotation *** ***   Elbow flexion *** ***   Elbow extension *** ***   Wrist extension *** ***   Wrist flexion *** ***   Finger abduction - FDI *** ***   Finger abduction - ADM *** ***   Finger extension *** ***   Finger distal flexion - 2/3 *** ***   Finger distal flexion - 4/5 *** ***   Thumb flexion - FPL *** ***   Thumb abduction - APB *** ***    Hip flexion *** ***   Hip extension *** ***   Hip adduction *** ***   Hip abduction *** ***   Knee extension *** ***   Knee flexion *** ***   Dorsiflexion *** ***   Plantarflexion *** ***   Inversion *** ***   Eversion *** ***   Great toe extension *** ***   Great toe flexion *** ***     Reflexes:  Right Left  Bicep *** ***  Tricep *** ***  BrRad *** ***  Knee *** ***  Ankle *** ***   Pathological Reflexes: Babinski: *** response bilaterally*** Hoffman: *** Troemner:  *** Pectoral: *** Palmomental: *** Facial: *** Midline tap: *** Sensation: Pinprick: *** Vibration: *** Temperature: *** Proprioception: *** Coordination: Intact finger-to- nose-finger and heel-to-shin bilaterally. Romberg negative.*** Gait: Able to rise from chair with arms crossed unassisted. Normal, narrow-based gait. Able to tandem walk. Able to walk on  toes and heels.***   Lab and Test Review: New results: 07/21/23: CK 82 MG panel: AChR abs negative  MRI cervical and lumbar spine (07/28/23): ***  EMG (07/31/23): ***  Previously reviewed results: 06/23/23: CBC w/ diff: unremarkable BMP significant for K 3.1, BUN 37, Cr 1.35 UA unremarkable  CT head wo contrast (06/23/23): FINDINGS: Brain: No acute intracranial hemorrhage. No CT evidence of acute infarct. Nonspecific hypoattenuation in the periventricular and subcortical white matter favored to reflect chronic microvascular ischemic changes. No edema, mass effect, or midline shift. The basilar cisterns are patent.   Ventricles: Prominence of the ventricles suggesting underlying parenchymal volume loss.   Vascular: There is focal hyperdensity in the right aspect of the suprasellar cistern in the expected location of the right ICA terminus. Atherosclerotic calcification of the carotid siphons.   Skull: No acute or aggressive finding.   Orbits: Orbits are symmetric.   Sinuses: The visualized paranasal sinuses are clear.   Other: Mastoid air cells are clear.   IMPRESSION: 1. Focal hyperdensity of the right ICA terminus. Recommend CTA for further evaluation. 2. No acute intracranial hemorrhage, mass effect, or CT evidence of acute infarct. 3. Chronic microvascular ischemic changes and parenchymal volume loss.   CTA head and neck (06/23/23): FINDINGS: CTA NECK FINDINGS   Aortic arch: Minimal calcification is present in the proximal descending thoracic aorta and distal arch. Great vessel origins  are unremarkable. No aneurysm or stenosis is present. No dissection is present.   Right carotid system: The right common carotid artery is within normal limits. Bifurcation is unremarkable. The cervical right ICA is normal.   Left carotid system: Left common carotid artery is within normal limits. The bifurcation is unremarkable. The cervical left ICA is normal.   Vertebral arteries: The vertebral arteries are codominant. Both vertebral arteries originate from the subclavian arteries without significant stenosis. No significant stenosis is present in either vertebral artery in the neck.   Skeleton: Multilevel degenerative changes are present in the cervical spine. Uncovertebral and facet hypertrophy leads to moderate to severe foraminal stenosis bilaterally at C2-3, C3-4 and C4-5. No focal osseous lesions are present.   Other neck: Soft tissues the neck are otherwise unremarkable. Salivary glands are within normal limits. Thyroid is normal. No significant adenopathy is present. No focal mucosal or submucosal lesions are present.   Upper chest: The lung apices are clear. The thoracic inlet is within normal limits.   Review of the MIP images confirms the above findings   CTA HEAD FINDINGS   Anterior circulation: The internal carotid arteries are within normal limits from the skull base to the ICA termini. The M1 segments are normal. The right A1 segment is dominant. The anterior communicating artery is patent. The MCA bifurcations are normal bilaterally. The ACA and MCA branch vessels are normal bilaterally. No aneurysm is present.   Posterior circulation: PICA origins are visualized and normal. The vertebrobasilar junction and basilar artery are normal. The superior cerebellar arteries are patent bilaterally. The right posterior cerebral artery originates from basilar tip. The left posterior cerebral artery is of fetal type. The PCA branch vessels are  normal bilaterally.   Venous sinuses: The dural sinuses are patent. The straight sinus and deep cerebral veins are intact. Cortical veins are within normal limits. No significant vascular malformation is evident.   Anatomic variants: Fetal type left posterior cerebral artery.   Review of the MIP images confirms the above findings   IMPRESSION: 1. Normal CTA of the head and neck. No  large vessel occlusion or hemodynamically significant stenosis. 2. Multilevel degenerative changes in the cervical spine. 3. Moderate to severe foraminal stenosis bilaterally at C2-3, C3-4 and C4-5.   MRI brain w/wo contrast (04/07/21): FINDINGS: Brain:   Cerebral volume appears normal for age.   Mild multifocal T2 FLAIR hyperintense signal abnormality within the cerebral white matter, nonspecific but compatible with chronic small vessel ischemic disease.   No evidence of an intracranial mass. Specifically, no cerebellopontine angle or internal auditory canal mass is demonstrated. Unremarkable appearance of the 7th and 8th cranial nerves bilaterally.   There is no acute infarct.   No chronic intracranial blood products.   No extra-axial fluid collection.   No midline shift.   No pathologic intracranial enhancement identified.   Vascular: Maintained flow voids within the proximal large arterial vessels.   Skull and upper cervical spine: No focal suspicious marrow lesion.   Sinuses/Orbits: Visualized orbits show no acute finding. Bilateral lens replacements. Minimal mucosal thickening within the bilateral ethmoid and right maxillary sinuses.   Other: Trace fluid within the left mastoid air cells.   IMPRESSION: No evidence of acute intracranial abnormality.   No cerebellopontine angle or internal auditory canal mass.   Mild chronic small vessel ischemic changes within the cerebral white matter.   Minimal mucosal thickening within the bilateral ethmoid and right maxillary  sinuses.   Trace fluid within left mastoid air cells.  ASSESSMENT: This is Audi Wettstein, a 83 y.o. male with:  ***  Plan: ***  Return to clinic in ***  Total time spent reviewing records, interview, history/exam, documentation, and coordination of care on day of encounter:  *** min  Jacquelyne Balint, MD

## 2023-07-27 NOTE — Telephone Encounter (Signed)
 Called pt and talked to husband and reported Dr. Jorge Ny answer. She understood and thanked [place].

## 2023-07-28 ENCOUNTER — Ambulatory Visit (HOSPITAL_COMMUNITY)
Admission: RE | Admit: 2023-07-28 | Discharge: 2023-07-28 | Disposition: A | Discharge status: Home / Self Care | Source: Ambulatory Visit | Attending: Internal Medicine | Admitting: Internal Medicine

## 2023-07-28 ENCOUNTER — Ambulatory Visit (HOSPITAL_COMMUNITY)

## 2023-07-28 DIAGNOSIS — M51369 Other intervertebral disc degeneration, lumbar region without mention of lumbar back pain or lower extremity pain: Secondary | ICD-10-CM | POA: Diagnosis not present

## 2023-07-28 DIAGNOSIS — M5126 Other intervertebral disc displacement, lumbar region: Secondary | ICD-10-CM | POA: Diagnosis not present

## 2023-07-28 DIAGNOSIS — R29898 Other symptoms and signs involving the musculoskeletal system: Secondary | ICD-10-CM | POA: Diagnosis not present

## 2023-07-28 DIAGNOSIS — M47812 Spondylosis without myelopathy or radiculopathy, cervical region: Secondary | ICD-10-CM | POA: Diagnosis not present

## 2023-07-28 DIAGNOSIS — M5021 Other cervical disc displacement,  high cervical region: Secondary | ICD-10-CM | POA: Diagnosis not present

## 2023-07-28 DIAGNOSIS — M47816 Spondylosis without myelopathy or radiculopathy, lumbar region: Secondary | ICD-10-CM | POA: Diagnosis not present

## 2023-07-28 DIAGNOSIS — M4802 Spinal stenosis, cervical region: Secondary | ICD-10-CM | POA: Diagnosis not present

## 2023-07-28 DIAGNOSIS — G9519 Other vascular myelopathies: Secondary | ICD-10-CM | POA: Diagnosis not present

## 2023-07-28 DIAGNOSIS — M5030 Other cervical disc degeneration, unspecified cervical region: Secondary | ICD-10-CM | POA: Diagnosis not present

## 2023-07-28 MED ORDER — GADOBUTROL 1 MMOL/ML IV SOLN
7.5000 mL | Freq: Once | INTRAVENOUS | Status: AC | PRN
  Administered 2023-07-28: 7.5 mL via INTRAVENOUS

## 2023-07-29 LAB — MYASTHENIA GRAVIS PANEL 2
A CHR BINDING ABS: <0.3 nmol/L
ACHR Blocking Abs: <15 %{inhibition} (ref <15)
Acetylchol Modul Ab: 12 %{inhibition}

## 2023-07-29 LAB — CK: Total CK: 83 U/L (ref 17–247)

## 2023-07-31 ENCOUNTER — Ambulatory Visit (INDEPENDENT_AMBULATORY_CARE_PROVIDER_SITE_OTHER): Admitting: Neurology

## 2023-07-31 DIAGNOSIS — M6281 Muscle weakness (generalized): Secondary | ICD-10-CM | POA: Diagnosis not present

## 2023-07-31 DIAGNOSIS — R253 Fasciculation: Secondary | ICD-10-CM

## 2023-07-31 DIAGNOSIS — M79651 Pain in right thigh: Secondary | ICD-10-CM

## 2023-07-31 DIAGNOSIS — Z993 Dependence on wheelchair: Secondary | ICD-10-CM

## 2023-08-01 ENCOUNTER — Inpatient Hospital Stay (HOSPITAL_COMMUNITY)

## 2023-08-01 ENCOUNTER — Emergency Department (HOSPITAL_COMMUNITY)

## 2023-08-01 ENCOUNTER — Encounter (HOSPITAL_COMMUNITY): Payer: Self-pay | Admitting: Internal Medicine

## 2023-08-01 ENCOUNTER — Inpatient Hospital Stay (HOSPITAL_COMMUNITY)
Admission: EM | Admit: 2023-08-01 | Discharge: 2023-08-31 | DRG: 073 | Disposition: E | Attending: Internal Medicine | Admitting: Internal Medicine

## 2023-08-01 ENCOUNTER — Telehealth: Payer: Self-pay | Admitting: Neurology

## 2023-08-01 ENCOUNTER — Other Ambulatory Visit: Payer: Self-pay

## 2023-08-01 DIAGNOSIS — E860 Dehydration: Secondary | ICD-10-CM | POA: Diagnosis present

## 2023-08-01 DIAGNOSIS — Z974 Presence of external hearing-aid: Secondary | ICD-10-CM

## 2023-08-01 DIAGNOSIS — H9193 Unspecified hearing loss, bilateral: Secondary | ICD-10-CM | POA: Diagnosis present

## 2023-08-01 DIAGNOSIS — N401 Enlarged prostate with lower urinary tract symptoms: Secondary | ICD-10-CM | POA: Diagnosis present

## 2023-08-01 DIAGNOSIS — Z993 Dependence on wheelchair: Secondary | ICD-10-CM | POA: Diagnosis not present

## 2023-08-01 DIAGNOSIS — M4802 Spinal stenosis, cervical region: Secondary | ICD-10-CM | POA: Diagnosis present

## 2023-08-01 DIAGNOSIS — G6181 Chronic inflammatory demyelinating polyneuritis: Principal | ICD-10-CM | POA: Diagnosis present

## 2023-08-01 DIAGNOSIS — R350 Frequency of micturition: Secondary | ICD-10-CM | POA: Diagnosis present

## 2023-08-01 DIAGNOSIS — J42 Unspecified chronic bronchitis: Secondary | ICD-10-CM

## 2023-08-01 DIAGNOSIS — M5124 Other intervertebral disc displacement, thoracic region: Secondary | ICD-10-CM | POA: Diagnosis not present

## 2023-08-01 DIAGNOSIS — I452 Bifascicular block: Secondary | ICD-10-CM | POA: Diagnosis present

## 2023-08-01 DIAGNOSIS — Z79899 Other long term (current) drug therapy: Secondary | ICD-10-CM | POA: Diagnosis not present

## 2023-08-01 DIAGNOSIS — R627 Adult failure to thrive: Secondary | ICD-10-CM | POA: Diagnosis present

## 2023-08-01 DIAGNOSIS — J4489 Other specified chronic obstructive pulmonary disease: Secondary | ICD-10-CM | POA: Diagnosis present

## 2023-08-01 DIAGNOSIS — G4733 Obstructive sleep apnea (adult) (pediatric): Secondary | ICD-10-CM | POA: Diagnosis not present

## 2023-08-01 DIAGNOSIS — Z87898 Personal history of other specified conditions: Secondary | ICD-10-CM | POA: Diagnosis not present

## 2023-08-01 DIAGNOSIS — Z7189 Other specified counseling: Secondary | ICD-10-CM | POA: Diagnosis not present

## 2023-08-01 DIAGNOSIS — I10 Essential (primary) hypertension: Secondary | ICD-10-CM | POA: Diagnosis present

## 2023-08-01 DIAGNOSIS — R54 Age-related physical debility: Secondary | ICD-10-CM | POA: Diagnosis not present

## 2023-08-01 DIAGNOSIS — R531 Weakness: Principal | ICD-10-CM

## 2023-08-01 DIAGNOSIS — Z6820 Body mass index (BMI) 20.0-20.9, adult: Secondary | ICD-10-CM

## 2023-08-01 DIAGNOSIS — Z1152 Encounter for screening for COVID-19: Secondary | ICD-10-CM

## 2023-08-01 DIAGNOSIS — Z9181 History of falling: Secondary | ICD-10-CM

## 2023-08-01 DIAGNOSIS — Z515 Encounter for palliative care: Secondary | ICD-10-CM | POA: Diagnosis not present

## 2023-08-01 DIAGNOSIS — R64 Cachexia: Secondary | ICD-10-CM | POA: Diagnosis not present

## 2023-08-01 DIAGNOSIS — G610 Guillain-Barre syndrome: Secondary | ICD-10-CM | POA: Diagnosis present

## 2023-08-01 DIAGNOSIS — R0602 Shortness of breath: Secondary | ICD-10-CM | POA: Diagnosis not present

## 2023-08-01 DIAGNOSIS — E43 Unspecified severe protein-calorie malnutrition: Secondary | ICD-10-CM | POA: Diagnosis not present

## 2023-08-01 DIAGNOSIS — R269 Unspecified abnormalities of gait and mobility: Secondary | ICD-10-CM | POA: Diagnosis not present

## 2023-08-01 DIAGNOSIS — R3915 Urgency of urination: Secondary | ICD-10-CM | POA: Diagnosis present

## 2023-08-01 DIAGNOSIS — J449 Chronic obstructive pulmonary disease, unspecified: Secondary | ICD-10-CM | POA: Diagnosis present

## 2023-08-01 DIAGNOSIS — M5031 Other cervical disc degeneration,  high cervical region: Secondary | ICD-10-CM | POA: Diagnosis present

## 2023-08-01 DIAGNOSIS — E876 Hypokalemia: Secondary | ICD-10-CM | POA: Diagnosis present

## 2023-08-01 DIAGNOSIS — F32A Depression, unspecified: Secondary | ICD-10-CM | POA: Diagnosis present

## 2023-08-01 DIAGNOSIS — E785 Hyperlipidemia, unspecified: Secondary | ICD-10-CM | POA: Diagnosis present

## 2023-08-01 DIAGNOSIS — M47812 Spondylosis without myelopathy or radiculopathy, cervical region: Secondary | ICD-10-CM | POA: Diagnosis present

## 2023-08-01 DIAGNOSIS — R4182 Altered mental status, unspecified: Secondary | ICD-10-CM | POA: Diagnosis not present

## 2023-08-01 DIAGNOSIS — I6782 Cerebral ischemia: Secondary | ICD-10-CM | POA: Diagnosis not present

## 2023-08-01 DIAGNOSIS — Z66 Do not resuscitate: Secondary | ICD-10-CM | POA: Diagnosis present

## 2023-08-01 DIAGNOSIS — R296 Repeated falls: Secondary | ICD-10-CM | POA: Diagnosis not present

## 2023-08-01 DIAGNOSIS — Z7401 Bed confinement status: Secondary | ICD-10-CM | POA: Diagnosis not present

## 2023-08-01 DIAGNOSIS — M5104 Intervertebral disc disorders with myelopathy, thoracic region: Secondary | ICD-10-CM | POA: Diagnosis not present

## 2023-08-01 DIAGNOSIS — I4891 Unspecified atrial fibrillation: Secondary | ICD-10-CM | POA: Diagnosis not present

## 2023-08-01 DIAGNOSIS — R9089 Other abnormal findings on diagnostic imaging of central nervous system: Secondary | ICD-10-CM

## 2023-08-01 LAB — RESP PANEL BY RT-PCR (RSV, FLU A&B, COVID)  RVPGX2
Influenza A by PCR: NEGATIVE
Influenza B by PCR: NEGATIVE
Resp Syncytial Virus by PCR: NEGATIVE
SARS Coronavirus 2 by RT PCR: NEGATIVE

## 2023-08-01 LAB — HEPATITIS PANEL, ACUTE
HCV Ab: NONREACTIVE
Hep A IgM: NONREACTIVE
Hep B C IgM: NONREACTIVE
Hepatitis B Surface Ag: NONREACTIVE

## 2023-08-01 LAB — COMPREHENSIVE METABOLIC PANEL WITH GFR
ALT: 43 U/L (ref 0–44)
AST: 38 U/L (ref 15–41)
Albumin: 3 g/dL — ABNORMAL LOW (ref 3.5–5.0)
Alkaline Phosphatase: 113 U/L (ref 38–126)
Anion gap: 11 (ref 5–15)
BUN: 40 mg/dL — ABNORMAL HIGH (ref 8–23)
CO2: 22 mmol/L (ref 22–32)
Calcium: 9.1 mg/dL (ref 8.9–10.3)
Chloride: 106 mmol/L (ref 98–111)
Creatinine, Ser: 0.62 mg/dL (ref 0.61–1.24)
GFR, Estimated: >60 mL/min (ref >60)
Glucose, Bld: 104 mg/dL — ABNORMAL HIGH (ref 70–99)
Potassium: 4.4 mmol/L (ref 3.5–5.1)
Sodium: 139 mmol/L (ref 135–145)
Total Bilirubin: 1 mg/dL (ref 0.0–1.2)
Total Protein: 5.8 g/dL — ABNORMAL LOW (ref 6.5–8.1)

## 2023-08-01 LAB — C-REACTIVE PROTEIN: CRP: 0.7 mg/dL (ref <1.0)

## 2023-08-01 LAB — CBC WITH DIFFERENTIAL/PLATELET
Abs Immature Granulocytes: 0.03 10*3/uL (ref 0.00–0.07)
Basophils Absolute: 0.1 10*3/uL (ref 0.0–0.1)
Basophils Relative: 1 %
Eosinophils Absolute: 0.6 10*3/uL — ABNORMAL HIGH (ref 0.0–0.5)
Eosinophils Relative: 8 %
HCT: 44.5 % (ref 39.0–52.0)
Hemoglobin: 14.7 g/dL (ref 13.0–17.0)
Immature Granulocytes: 0 %
Lymphocytes Relative: 16 %
Lymphs Abs: 1.2 10*3/uL (ref 0.7–4.0)
MCH: 31.7 pg (ref 26.0–34.0)
MCHC: 33 g/dL (ref 30.0–36.0)
MCV: 96.1 fL (ref 80.0–100.0)
Monocytes Absolute: 1.1 10*3/uL — ABNORMAL HIGH (ref 0.1–1.0)
Monocytes Relative: 15 %
Neutro Abs: 4.4 10*3/uL (ref 1.7–7.7)
Neutrophils Relative %: 60 %
Platelets: 253 10*3/uL (ref 150–400)
RBC: 4.63 MIL/uL (ref 4.22–5.81)
RDW: 15.7 % — ABNORMAL HIGH (ref 11.5–15.5)
WBC: 7.2 10*3/uL (ref 4.0–10.5)
nRBC: 0 % (ref 0.0–0.2)

## 2023-08-01 LAB — VITAMIN B12: Vitamin B-12: 612 pg/mL (ref 180–914)

## 2023-08-01 LAB — RAPID HIV SCREEN (HIV 1/2 AB+AG)
HIV 1/2 Antibodies: NONREACTIVE
HIV-1 P24 Antigen - HIV24: NONREACTIVE

## 2023-08-01 LAB — SEDIMENTATION RATE: Sed Rate: 13 mm/h (ref 0–16)

## 2023-08-01 LAB — FOLATE: Folate: >40 ng/mL (ref >5.9)

## 2023-08-01 MED ORDER — MORPHINE SULFATE (PF) 4 MG/ML IV SOLN
4.0000 mg | Freq: Once | INTRAVENOUS | Status: AC
  Administered 2023-08-01: 4 mg via INTRAVENOUS
  Filled 2023-08-01: qty 1

## 2023-08-01 MED ORDER — SODIUM CHLORIDE 0.9 % IV BOLUS
1000.0000 mL | Freq: Once | INTRAVENOUS | Status: AC
  Administered 2023-08-01: 1000 mL via INTRAVENOUS

## 2023-08-01 MED ORDER — ALBUTEROL SULFATE (2.5 MG/3ML) 0.083% IN NEBU: 2.5000 mg | INHALATION_SOLUTION | RESPIRATORY_TRACT | Status: DC | PRN

## 2023-08-01 MED ORDER — ACETAMINOPHEN 650 MG RE SUPP: 650.0000 mg | Freq: Four times a day (QID) | RECTAL | Status: DC | PRN

## 2023-08-01 MED ORDER — GADOBUTROL 1 MMOL/ML IV SOLN
8.0000 mL | Freq: Once | INTRAVENOUS | Status: AC | PRN
  Administered 2023-08-01: 8 mL via INTRAVENOUS

## 2023-08-01 MED ORDER — LIDOCAINE HCL 2 % IJ SOLN
10.0000 mL | Freq: Once | INTRAMUSCULAR | Status: DC
  Filled 2023-08-01: qty 20

## 2023-08-01 MED ORDER — HYDROCODONE-ACETAMINOPHEN 5-325 MG PO TABS
1.0000 | ORAL_TABLET | ORAL | Status: DC | PRN
  Administered 2023-08-05: 1 via ORAL
  Filled 2023-08-01: qty 1

## 2023-08-01 MED ORDER — ONDANSETRON HCL 4 MG/2ML IJ SOLN: 4.0000 mg | Freq: Four times a day (QID) | INTRAMUSCULAR | Status: DC | PRN

## 2023-08-01 MED ORDER — SODIUM CHLORIDE 0.9 % IV SOLN: INTRAVENOUS | Status: AC

## 2023-08-01 MED ORDER — ONDANSETRON HCL 4 MG PO TABS: 4.0000 mg | ORAL_TABLET | Freq: Four times a day (QID) | ORAL | Status: DC | PRN

## 2023-08-01 MED ORDER — ACETAMINOPHEN 325 MG PO TABS
650.0000 mg | ORAL_TABLET | Freq: Four times a day (QID) | ORAL | Status: DC | PRN
  Administered 2023-08-06: 650 mg via ORAL
  Filled 2023-08-01: qty 2

## 2023-08-01 NOTE — TOC Initial Note (Signed)
 Transition of Care [facility]) - Initial/Assessment Note    Patient Details  Name: Peter Stone MRN: [id number] Date of Birth: March 27, 1941  Transition of Care [facility]) CM/SW Contact:    Lucretia Field, LCSW Phone Number: 08/01/2023, 9:08 PM  Clinical Narrative:                 Patient is from home with his wife Peter Stone, wife Peter Stone stated the patient has not been eating, drinking much and have been trying to drink 3 boost a day. Patient was receiving PT/OT/SN/SW at home from previous fall in Feb. Patient has hx of COPD,, HTN, HLD, HOH, and OSA.         Patient Goals and CMS Choice           Expected Discharge Plan and Services Potentially SNF vs home with home health.   Pending PT/OT recs.                                            Prior Living Arrangements/Services Lives with home with his wife.                      Activities of Daily Living   ADL Screening (condition at time of admission) Independently performs ADLs?: No Does the patient have a NEW difficulty with bathing/dressing/toileting/self-feeding that is expected to last >3 days?: No (total care with bathing and dressing) Does the patient have a NEW difficulty with getting in/out of bed, walking, or climbing stairs that is expected to last >3 days?: No (total care patient bedbound; 2 assist to chair) Does the patient have a NEW difficulty with communication that is expected to last >3 days?: No Is the patient deaf or have difficulty hearing?: Yes Does the patient have difficulty seeing, even when wearing glasses/contacts?: Yes (reading glasses) Does the patient have difficulty concentrating, remembering, or making decisions?: Yes (forgetful at times)  Permission Sought/Granted                  Emotional Assessment              Admission diagnosis:  GBS (Guillain Barre syndrome) (HCC) [G61.0] Patient Active Problem List   Diagnosis Date Noted   GBS (Guillain Barre syndrome) (HCC)  08/01/2023   COPD (chronic obstructive pulmonary disease) (HCC) 08/01/2023   Essential hypertension 08/01/2023   PCP:  Emilio Aspen, MD Pharmacy:   [id number] [place], [place] - [address] AT [facility] [address] [facility] [address] & [address] [place] [address] Phone: [phone] Fax: [phone]  [id number] - [place], [place] - [address] AT [facility] [address] [place] [address] Phone: [phone] Fax: [phone]     Social Drivers of Health (SDOH) Social History: SDOH Screenings   Food Insecurity: No Food Insecurity (08/01/2023)  Housing: Low Risk  (08/01/2023)  Transportation Needs: No Transportation Needs (08/01/2023)  Utilities: Not At Risk (08/01/2023)  Social Connections: Moderately Isolated (08/01/2023)  Tobacco Use: Low Risk  (08/01/2023)   SDOH Interventions:     Readmission Risk Interventions     No data to display         .Lily Peer, MSW, LCSWA Transition of Care  Clinical Social Worker (ED 3-11 Mon-Fri)  [phone]

## 2023-08-01 NOTE — Procedures (Signed)
 [facility]  [address] [place], [address]  [place], [place] [address] Tel: [phone] Fax: [phone] Test Date:  07/31/2023  Patient: Peter Stone DOB: 1941-02-17 Physician: Jacquelyne Balint, MD  Sex: Male Height: 5\' 10"  Ref Phys: Jacquelyne Balint, MD  ID#: [id number]   Technician:    History: This is an 83 year old male with progressive weakness.  NCV & EMG Findings: Extensive electrodiagnostic evaluation of the left upper and lower limbs with needle examination of thoracic paraspinal muscles shows: Left sural and superficial peroneal/fibular sensory responses are absent. Left median, ulnar, and radial sensory responses are within normal limits. Left peroneal/fibular (EDB), peroneal/fibular (TA), tibial (AH), median (APB), and ulnar (ADM) motor responses show reduced amplitudes (EDB 1.03 mV, TA 1.40 mV, AH 3.5 mV, APB 4.8 mV, ADM 6.6 mV). Conduction velocities are normal. Left H reflex is absent. Chronic motor axon loss changes WITH accompanying active denervation changes are seen in left tibialis anterior and pronator teres. Active denervation changes without any observable motor units are seen in left medial head of gastrocnemius, short head of biceps femoris, rectus femoris, and gluteus medius, Chronic motor axon loss changes WITHOUT definitive accompanying active denervation changes are seen in left first dorsal interosseous, extensor indicis proprius, biceps, deltoid, and thoracic paraspinal (T9 level muscles (which patient could not relax). Patient could not tolerate further paraspinal muscle testing due to discomfort. Fasciculations are seen in at least 3 of 12 tested muscles.  Impression: This is an abnormal study. The findings are most consistent with the following: There is active/ongoing denervation with reinnervation in all muscles evaluated in the lower extremity with mostly chronic changes seen in the upper extremity. This could be representative of a wide spread,  non-length dependent neuropathy given absent lower extremity sensory responses. Alternatively, absent lower extremity sensory responses may be due to age, which would make changes more consistent with polyradiculopathy or an evolving disorder of anterior horn cells (ie: motor neuron disease) among other possibilities. There is no definitive evidence of a demyelinating neuropathy. No electrodiagnostic evidence of myopathy.    ___________________________ Jacquelyne Balint, MD    Nerve Conduction Studies Motor Nerve Results    Latency Amplitude F-Lat Segment Distance CV Comment  Site (ms) Norm (mV) Norm (ms)  (cm) (m/s) Norm   Left Fibular (EDB) Motor  Ankle 4.5  < 6.0 *1.03  > 2.5        Bel fib head *NR - *NR -  Bel fib head-Ankle - *NR  > 40   Pop fossa *NR - *NR -  Pop fossa-Bel fib head - *NR -   Left Fibular (TA) Motor  Fib head 2.9  < 4.5 *1.40  > 3.0        Pop fossa 5.2  < 6.7 1.34 -  Pop fossa-Fib head 10 43  > 40   Left Median (APB) Motor  Wrist 2.7  < 4.0 *4.8  > 5.0        Elbow 8.4 - 4.4 -  Elbow-Wrist 28.5 50  > 50   Left Tibial (AH) Motor  Ankle 5.5  < 6.0 *3.5  > 4.0        Knee 15.5 - 1.52 -  Knee-Ankle 44 44  > 40   Left Ulnar (ADM) Motor  Wrist 2.2  < 3.1 *6.6  > 7.0        Bel elbow 6.4 - 4.5 -  Bel elbow-Wrist 22 52  > 50   Ab elbow 8.3 - 3.8 -  Ab elbow-Bel elbow 10 53 -    Sensory Sites    Neg Peak Lat Amplitude (O-P) Segment Distance Velocity Comment  Site (ms) Norm (V) Norm  (cm) (ms)   Left Median Sensory  Wrist-Dig II 3.1  < 3.8 15  > 10 Wrist-Dig II 13    Left Radial Sensory  Forearm-Wrist 2.5  < 2.8 13  > 10 Forearm-Wrist 10    Left Superficial Fibular Sensory  14 cm-Ankle *NR  < 4.6 *NR  > 3 14 cm-Ankle 14    Left Sural Sensory  Calf-Lat mall *NR  < 4.6 *NR  > 3 Calf-Lat mall 14    Left Ulnar Sensory  Wrist-Dig V 2.9  < 3.2 6  > 5 Wrist-Dig V 11     H-Reflex Results    M-Lat H Lat H Neg Amp H-M Lat  Site (ms) (ms) Norm (mV) (ms)  Left Tibial  H-Reflex  Pop fossa 7.9 -  < 35.0 - -   Electromyography   Side Muscle Ins.Act Fibs Fasc Recrt Amp Dur Poly Activation Comment  Left FDI Nml Nml *1+ *3- *1+ *1+ *1+ *1- N/A  Left EIP Nml Nml *1+ *2- *1+ *1+ *1+ *1- N/A  Left Pronator teres Nml *2+ Nml *2- *1+ *1+ *1+ *1- N/A  Left Biceps Nml Nml *1+ *3- *1+ *1+ *1+ *1- N/A  Left Triceps Nml Nml Nml *None *- *- *- *2- N/A  Left Deltoid Nml Nml Nml *2- Nml Nml Nml *2- N/A  Left Tib ant Nml *2+ Nml *3- *1+ *1+ *1+ *2- N/A  Left Gastroc MH Nml *1+ Nml *None *- *- *- *None N/A  Left Rectus fem Nml *2+ Nml *None *- *- *- *None N/A  Left Biceps fem SH Nml *2+ Nml *None *- *- *- *None N/A  Left T9 PSP Nml Nml Nml *1- *1+ *1+ *1+ Nml Unable to relax  Left Gluteus med Nml *1+ Nml *None *- *- *- *None N/A      Waveforms:  Motor             Sensory             H-Reflex

## 2023-08-01 NOTE — Consult Note (Addendum)
 NEUROLOGY CONSULT NOTE   Date of service: August 01, 2023 Patient Name: Peter Stone MRN:  [id number] DOB:  09-03-1940 Chief Complaint: "Weakness" Requesting Provider: Therisa Doyne, MD  History of Present Illness  Peter Stone is a 83 y.o. male with hx of hypertension, OSA, hyperlipidemia seen in the neurology clinic by Dr. Jacquelyne Balint for progressive generalized weakness.  History obtained from chart review per the excellent history and the neuromuscular clinic note as pasted below. He presented to the ER due to multiple reasons: Poor p.o. intake and concern from the family about adequate nutrition and hydration, inability to walk, worsening weakness and MRI of the lumbar spine showing enhancement of nerve roots concerning for inflammatory versus infectious versus low suspicion neoplastic process and inability to get timely testing done in the outpatient setting.  Patient reports he is feeling somewhat better after receiving IV fluids.  No change in weakness.  Has a pretty guttural quality to his voice.  HPI from Dr. Adaline Stone note below "Per patient's wife, patient has been slowly down the last few years. Wife notes a sedentary lifestyle and lack of exercise for a while. He seems to have moved slower for the last few years and not been able to get around as well as prior. Around Christmas of 2024, patient had a respiratory illness and more acute change. Patient had a cough and had difficulty recovering. He had to get a prescription cough medication from Dr. Nehemiah Settle. A couple weeks later, patient called his wife to bed stating he could not stand. Wife helped him stand and got his balance. Wife took patient to urgent care on 05/06/23. He was able to slowly walk unaided at that time. His legs were starting to hurt and be weak. He mentioned thigh pain. It was an achy, muscle like pain. At that time, the concern for physical deconditioning. He did get a lumbar xray that reportedly did not show  anything. Patient then saw Dr. Luis Abed at [facility] 05/26/23. He had a fall in the parking lot that day going into the appointment. The thought again was deconditioning, so PT was recommended. He was also recommended to use a walker, which he started using on 05/26/23. He started this on 06/01/23 (twice weekly). There may have been some mild improvement, but patient was experience some pain, so missed some appointments. Patient is not sure he was getting better and was discouraged. He then went back to PCP (Dr. Orson Aloe) 06/16/23. There was a lot of blood work done. Per provided referral notes, patient had a normal CBC, CMP, CK, ESR, B12, TSH, UA. He also had reduction of BP meds as his BP was low. Patient continued PT but then still seemed to get worse weakness and was very sore. He had another fall on 06/23/23. Wife could not get him up alone. EMS was called. There was concern for afib by EMS. He had labs, CTH, and CTA head and neck. His testing was significant for moderate to severe foraminal stenosis bilaterally at C2-3, C3-4 and C4-5 seen on CTA head and neck, but otherwise was unremarkable. He was discharged home with home health care who started seeing patient on 07/01/23. He is now getting home therapy. It is unclear if this is helping, but patient is having a lot of pain and fatigue due to the therapy. Therapist has also noticed afib. Patient continued to get weaker, so he now has a hospital bed, cannot stand up, and has to use a walker since 07/2023. He  has difficulty rolling over in bed. Patient was referred to cardiology but will not see them until 10/2023. He is scheduled for MRI cervical spine, lumbar spine, and echocardiogram on 07/28/23. Patient endorses pain in thighs but no numbness or tingling. He denies pain in arms until today when his left arm hurt (which may be due to overuse be wife). He has difficulty lifting the left arm but not the right. The patient denies symptoms suggestive  of oculobulbar weakness including diplopia, ptosis, dysphagia, poor saliva control, dysarthria/dysphonia, impaired mastication, facial weakness/droop. There are no neuromuscular respiratory weakness symptoms, particularly orthopnea>dyspnea. He does have untreated OSA (no CPAP since 04/2023).  Pseudobulbar affect is absent. He is more agitated and frustrated than normal. Patient has expressed being down and not feeling he is going to get better and is going to die. He has not expressed SI. There is no access to weapons to hurt himself. He is losing weight because he is not eating. He does not think there is any taste in food. He is drinking 3 boosts per day. There is no concern for fevers or night sweats. Patient has HLD but does not recall ever being on lipid lowering medications, specifically statins. EtOH use: None  Family history of neuropathy/myopathy/NM/neurologic disease? no "    ROS  Comprehensive ROS performed and pertinent positives documented in HPI    Past History   Past Medical History:  Diagnosis Date   Hearing impaired person, bilateral    wears hearing aids   Hypertension     Past Surgical History:  Procedure Laterality Date   APPENDECTOMY     laparoscopic   CATARACT EXTRACTION, BILATERAL     COLONOSCOPY WITH PROPOFOL N/A 03/07/2016   Procedure: COLONOSCOPY WITH PROPOFOL;  Surgeon: Charolett Bumpers, MD;  Location: [facility];  Service: Endoscopy;  Laterality: N/A;   VASECTOMY      Family History: No family history on file.  Social History  reports that he has never smoked. He has never used smokeless tobacco. He reports that he does not drink alcohol and does not use drugs.  No Known Allergies  Medications   Current Facility-Administered Medications:    lidocaine (XYLOCAINE) 2 % (with pres) injection 200 mg, 10 mL, Intradermal, Once, Fayrene Helper, PA-C  Current Outpatient Medications:    acetaminophen (TYLENOL) 325 MG tablet, Take 650 mg by mouth every 12  (twelve) hours as needed., Disp: , Rfl:    cetirizine (ZYRTEC) 10 MG chewable tablet, Chew 10 mg by mouth daily., Disp: , Rfl:    cholecalciferol (VITAMIN D3) 25 MCG (1000 UNIT) tablet, Take 1,000 Units by mouth daily., Disp: , Rfl:    hydrochlorothiazide (HYDRODIURIL) 25 MG tablet, Take 12.5 mg by mouth at bedtime. (Patient not taking: Reported on 07/21/2023), Disp: , Rfl:    ibuprofen (ADVIL) 200 MG tablet, Take 400 mg by mouth every 12 (twelve) hours as needed., Disp: , Rfl:    lisinopril (ZESTRIL) 10 MG tablet, Take 10 mg by mouth daily. (Patient not taking: Reported on 07/21/2023), Disp: , Rfl:    Multiple Vitamins-Minerals (MENS MULTIPLUS PO), Take 1 tablet by mouth daily., Disp: , Rfl:    sertraline (ZOLOFT) 25 MG tablet, Take 25 mg by mouth daily. (Patient not taking: Reported on 06/23/2023), Disp: , Rfl:   Vitals   Vitals:   08/01/23 1327 08/01/23 1735 08/01/23 1800  BP: (!) 163/96 (!) 174/106 (!) 147/78  Pulse: 85 (!) 106 77  Resp: 19 18 17   Temp: 98.2 F (  36.8 C) 98.3 F (36.8 C)   SpO2: 97% 100% 99%    There is no height or weight on file to calculate BMI.  Physical Exam  General: Thin almost cachectic looking man in no acute distress. HEENT: Normocephalic atraumatic Lungs: Clear Cardiovascular: Regular rhythm Psych: Affect is flat Neurologic exam He is alert, awake, oriented to self and the fact that he is in the hospital. He was able to tell me his correct date of birth. Very guttural voice with some dysarthria. No clear aphasia Cranial nerves II to XII: Pupils equal round react light, extraocular movements intact, visual fields full, face appears grossly symmetric, facial sensation intact, tongue and palate midline.  No tongue fasciculations Motor examination: He is able to lift both his upper extremities against gravity and is flaccid in bilateral lower extremities proximally with some distal movement with 4 -/5 plantarflexion and barely 3/5 dorsiflexion.  Muscle tone  is reduced all over and he does exhibit some fasciculations in all muscle groups Sensation intact to light touch DTRs absent all over Plantar reflexes mute bilaterally Has been bedbound-unable to test gait  Labs/Imaging/Neurodiagnostic studies   CBC:  Recent Labs  Lab 08-06-23 1350  WBC 7.2  NEUTROABS 4.4  HGB 14.7  HCT 44.5  MCV 96.1  PLT 253   Basic Metabolic Panel:  Lab Results  Component Value Date   NA 139 08-06-23   K 4.4 08/06/2023   CO2 22 08/06/23   GLUCOSE 104 (H) 08-06-2023   BUN 40 (H) Aug 06, 2023   CREATININE 0.62 08-06-2023   CALCIUM 9.1 08-06-2023   GFRNONAA >60 2023-08-06   GFRAA  07/10/2008    >60        The eGFR has been calculated using the MDRD equation. This calculation has not been validated in all clinical situations. eGFR's persistently <60 mL/min signify possible Chronic Kidney Disease.   Myasthenia panel-ACH R binding, blocking and modulating antibodies negative C-reactive protein 0.7 ESR13 Acute hepatitis panel negative  MRI of the brain and thoracic spine pending MRI of the lumbar spine done outpatient 07/28/2023-substantial abnormal ventral nerve root enhancement in the cauda equina with some low-level leptomeningeal enhancement along the anterior margin of the conus.  Guillain-Barr syndrome is most likely because of this pattern.  Pattern cannot really be seen in other settings including spinal cord infarct, tickborne encephalopathy.  Leptomeningeal metastatic disease seems less likely given how the dorsal nerve roots are spared.  Lumbar spondylosis and degenerative disc disease causing mild impingement at L1-2, L2-3 and L3-4 levels.  Mild scoliosis.  Neurodiagnostics EMG/NCS Impression: This is an abnormal study. The findings are most consistent with the following: There is active/ongoing denervation with reinnervation in all muscles evaluated in the lower extremity with mostly chronic changes seen in the upper extremity. This  could be representative of a wide spread, non-length dependent neuropathy given absent lower extremity sensory responses. Alternatively, absent lower extremity sensory responses may be due to age, which would make changes more consistent with polyradiculopathy or an evolving disorder of anterior horn cells (ie: motor neuron disease) among other possibilities. There is no definitive evidence of a demyelinating neuropathy. No electrodiagnostic evidence of myopathy.  ASSESSMENT   Harman Langhans is a 83 y.o. male with progressive generalized weakness and on examination proximal lower extremity weakness worse than distal lower extremity weakness and some weakness also in the upper extremities along with what sounds like some bulbar involvement now.  MRI of the lumbar spine concerning for ventral nerve root enhancement-in the  pattern seen with Guillain-Barr syndrome but there is also some leptomeningeal enhancement along the anterior margin of the conus making the differential broad to infectious versus inflammatory versus malignant/metastatic. LP attempted in the ED-unsuccessful.  Awaiting fluoroscopy guided LP. MRI of the brain and thoracic spine also obtained. Electrographic studies show active/ongoing denervation with reinnervation in all muscles evaluated in the lower extremity with mostly chronic changes seen in the upper extremity.  This could be representative of widespread nonlength-dependent neuropathy given absent lower extremity sensory responses-alternatively the lower extremity sensory responses may be absent due to age which would make changes more consistent with polyradiculopathy or evolving disorder of anterior horn cells i.e. motor neuron disease among other possibilities.  Broad differentials at this time for his progressive weakness that includes polyradiculoneuropathy such as Guillain-Barr syndrome/CIDP versus motor neuron disease.  Workup being directed by the outpatient neuromuscular  specialist-appreciate guidance and communication.  RECOMMENDATIONS  MRI of the brain MRI of the thoracic spine Lumbar puncture for cell count, protein, glucose, cytology, meningitis/encephalitis panel, CSF VZV IgG, IgG index, oligoclonal band. Consider testing for Lyme disease as well as checking zinc, copper, B12, MMA, folate, vitamin D, thiamine, vitamin B 3 and vitamin B6. Follow-up on remaining studies of the following: - ESR, CRP, HIV, RPR, QuantiFERON gold TB testing, hepatitis panel. I would also add muscle specific kinase for completion of the myasthenic panel With a guttural voice and bulbar symptoms-would send anti-GQ 1B antibodies Autoimmune neuropathy panel Therapy assessments Correction of metabolic derangements and nutritional management per primary team Once all the above testing is available-we will provide further recommendations in conjunction with discussion with the neuromuscular specialist.  ______________________________________________________________________    Signed, Milon Dikes, MD [facility]

## 2023-08-01 NOTE — Telephone Encounter (Signed)
 Called patient's wife, Leia Alf to discuss her husband's MRI results and EMG. MRI lumbar spine showed enhancement of nerve roots concerning for inflammatory vs infectious vs neoplastic process. Given the EMG results showing severe denervation, patient needs an urgent spinal fluid analysis with LP.  Wife also mentions patient is no longer eating or drinking well and is concerned about nutrition and hydration. Patient continues to be non-ambulatory and difficult to move.  For this reason, I reached out to neurohosp, Dr. Iver Nestle, who has already written an excellent telephone note with the work up we discussed. I also spoke with hospitalist, Dr. Austin Miles, who also agreed that patient would benefit from admission. However, there is a long wait for beds via direct admission, so care and testing for patient would be delayed. He recommended EMS take patient to ED where medicine and neurology could be consulted and begin testing and management of patient now.  I discussed this with wife who agreed to call EMS and have patient taken to ED today.  All questions were answered.  I recommend this expedited work up:  -Nutritional labs like B1, B12, folate, zinc, copper, B6. -Lumbar puncture with cell count, protein, glucose, IgG index, cytology, HSV, VZV, OCB, Lyme, meningitis/encephalitis panel -Patient would also benefit from routine lab work and likely hydration/nutrition  Jacquelyne Balint, MD [facility] Neurology

## 2023-08-01 NOTE — Assessment & Plan Note (Addendum)
 Pt no longer takes Lisinopril

## 2023-08-01 NOTE — Assessment & Plan Note (Signed)
 Neurology recommend extensive work up  for Possible GBS Appreciate their consult LP attempted in ER but not successful  Admitted for IR LP  Monitoring respiratory status And rehydration

## 2023-08-01 NOTE — Assessment & Plan Note (Addendum)
 Pt not taking Zoloft

## 2023-08-01 NOTE — Telephone Encounter (Signed)
 Discussed with Dr. Loleta Chance this is an 83 year old patient with subacute weakness starting at least in December 2024 but possibly some progressive weakness prior to that.  EMG pending formal report but not consistent with demyelination or myopathy.  There are some acute and chronic changes  MRI cervical spine 3/38 with and without contrast 1. Cervical spondylosis and degenerative disc disease causing prominent impingement at C3-4 and C4-5; mild to moderate impingement at C2-3; and mild impingement at C6-7, as detailed above. 2. No cord signal abnormality is identified, but assessment is limited by motion artifact.  MRI lumbar spine 3/38 with and without contrast 1. Substantial abnormal ventral nerve root enhancement in the cauda equina, with some low-level leptomeningeal enhancement along the anterior margin of the conus. Guillain-Barre syndrome is the most likely cause of this pattern. The pattern can rarely be seen in some other settings including spinal cord infarct and tick-borne encephalopathy. Leptomeningeal metastatic disease seems less likely given how the dorsal nerve roots are spared. 2. Lumbar spondylosis and degenerative disc disease, causing mild impingement at L1-2, L2-3, and L3-4. 3. Mild dextroconvex scoliosis with rotary component.  CK on 07/21/2018 2583  On Dr. Adaline Sill discussion with patient's wife the patient has has a substantial decline, no longer eating, nonambulatory   Agree that the patient needs expedited in-hospital workup due to his decline  Preliminarily considering the following workup but this may be adjusted based on full evaluation -MRI brain with and without contrast and MRI thoracic spine with and without contrast to complete CNS imaging -Lumbar puncture for cell counts, protein, glucose, cytopathology, meningitis/encephalitis PCR panel, CSF IgG VZV,'s IgG index, oligoclonal bands light, consider testing for Lyme disease -Nutritional workup given poor  intake as well: Zinc, copper, B12, MMA, folate, Vitamin E, thiamine, B3, B6 -ESR, CRP, HIV, RPR, QuantiFERON gold TB testing, hepatitis panel, -Recommend admission to [facility], please notify neurology on arrival so that we can follow formally in consultation

## 2023-08-01 NOTE — Subjective & Objective (Signed)
 Patient has been evaluated as an outpatient by neurology for progressive weakness that started months ago after having URI symptoms symptoms for started and lower extremities no progress in upper extremities his voice become more coarse.  He is having harder time eating and tolerating p.o.  At this point became wheelchair-bound. Even prior to this patient have had sedentary lifestyle and not being able to get around very well. But this particular worsening seem to came on much more suddenly much more progressively.  He tried to stand up one morning and could not although gradually some of his symptoms improved and he was able to walk unaided follow while noted significant body aches and muscle aches at that time  Family is concerned because he is losing weight and has decreased p.o. intake he has been trying to drink 3 boost per day No fevers no chills Patient denies any sensory abnormalities. Denies fasciculations Has not been on statins   Neurology felt that patient Would benefit from admission for further workup Of note on 28 March MRI cervical and lumbar spine was done Showing  Substantial abnormal ventral nerve root enhancement in the cauda equina, with some low-level leptomeningeal enhancement along the anterior margin of the conus. Guillain-Barre syndrome is the most likely cause of this pattern. Other differential include tickborne illness encephalitis leptomeningeal metastatic disease but felt to be somewhat less likely

## 2023-08-01 NOTE — H&P (Signed)
 Peter Stone ZOX:[id number] DOB: 1940-11-26 DOA: 08/01/2023     PCP: Emilio Aspen, MD      NEurology   Dr. Loleta Chance   Patient arrived to ER on 08/01/23 at 1318 Referred by Attending Therisa Doyne, MD   Patient coming from:    home Lives   With family     Chief Complaint: Progressive weakness   HPI: Peter Stone is a 83 y.o. male with medical history significant of COPD, HTN, HLD, HOH, OSA    Presented with progressive weakness Patient has been evaluated as an outpatient by neurology for progressive weakness that started months ago after having URI symptoms symptoms for started and lower extremities no progress in upper extremities his voice become more coarse.  He is having harder time eating and tolerating p.o.  At this point became wheelchair-bound. Even prior to this patient have had sedentary lifestyle and not being able to get around very well. But this particular worsening seem to came on much more suddenly much more progressively.  He tried to stand up one morning and could not although gradually some of his symptoms improved and he was able to walk unaided follow while noted significant body aches and muscle aches at that time  Family is concerned because he is losing weight and has decreased p.o. intake he has been trying to drink 3 boost per day No fevers no chills Patient denies any sensory abnormalities. Denies fasciculations Has not been on statins   Neurology felt that patient Would benefit from admission for further workup Of note on 28 March MRI cervical and lumbar spine was done Showing  Substantial abnormal ventral nerve root enhancement in the cauda equina, with some low-level leptomeningeal enhancement along the anterior margin of the conus. Guillain-Barre syndrome is the most likely cause of this pattern. Other differential include tickborne illness encephalitis leptomeningeal metastatic disease but felt to be somewhat less likely      Denies significant ETOH intake   Does not smoke   Lab Results  Component Value Date   SARSCOV2NAA NEGATIVE 08/01/2023   SARSCOV2NAA NEGATIVE 06/23/2023        Regarding pertinent Chronic problems:    Hyperlipidemia - not on statins     HTN on lisinopril hydrochlorothiazide    COPD - not  followed by pulmonology      While in ER:   LP attempted was unsuccessful Neurology recommends admission for further workup  differential diagnosis includes GBS, MG, encephalitis, infectious, electrolytes derangement, stroke, MS     Lab Orders         Resp panel by RT-PCR (RSV, Flu A&B, Covid) Anterior Nasal Swab         CSF culture w Gram Stain         VZV PCR, CSF         Comprehensive metabolic panel         CBC with Differential         Urinalysis, Routine w reflex microscopic -Urine, Clean Catch         CSF cell count with differential         Protein and glucose, CSF         Meningitis/Encephalitis Panel (CSF)         Lyme disease dna by pcr(borrelia burg)         IgG CSF index         Draw extra clot tube         Oligoclonal bands,  CSF + serum         Draw extra clot tube         Zinc         Copper, serum         Vitamin B12         Methylmalonic acid(mma), rnd urine         Folate         Vitamin E         Vitamin B1         Sedimentation rate         C-reactive protein         Rapid HIV screen (HIV 1/2 Ab+Ag)         Hepatitis panel, acute         Miscellaneous [facility] test (send-out)         Vitamin B6         QuantiFERON-TB Gold Plus         CK         Magnesium         Phosphorus         TSH         Prealbumin         Protime-INR         Urinalysis, Complete w Microscopic -Urine, Clean Catch         Vitamin B12         Folate         VITAMIN D 25 Hydroxy (Vit-D Deficiency, Fractures)      Following Medications were ordered in ER: Medications  lidocaine (XYLOCAINE) 2 % (with pres) injection 200 mg (has no administration in time range)  morphine (PF) 4  MG/ML injection 4 mg (has no administration in time range)  gadobutrol (GADAVIST) 1 MMOL/ML injection 8 mL (has no administration in time range)  sodium chloride 0.9 % bolus 1,000 mL (0 mLs Intravenous Stopped 08/01/23 1941)    _______________________________________________________ ER Provider Called:    Neurology   Dr. Iver Nestle They Recommend admit to medicine   Neurology will follow up   ED Triage Vitals [08/01/23 1327]  Encounter Vitals Group     BP (!) 163/96     Systolic BP Percentile      Diastolic BP Percentile      Pulse Rate 85     Resp 19     Temp 98.2 F (36.8 C)     Temp src      SpO2 97 %     Weight      Height      Head Circumference      Peak Flow      Pain Score      Pain Loc      Pain Education      Exclude from Growth Chart   TMAX(24)@     _________________________________________ Significant initial  Findings: Abnormal Labs Reviewed  COMPREHENSIVE METABOLIC PANEL WITH GFR - Abnormal; Notable for the following components:      Result Value   Glucose, Bld 104 (*)    BUN 40 (*)    Total Protein 5.8 (*)    Albumin 3.0 (*)    All other components within normal limits  CBC WITH DIFFERENTIAL/PLATELET - Abnormal; Notable for the following components:   RDW 15.7 (*)    Monocytes Absolute 1.1 (*)    Eosinophils Absolute 0.6 (*)    All other components within normal limits  ECG: Ordered Personally reviewed and interpreted by me showing: HR : 78 Rhythm: Sinus rhythm RBBB and LAFB Minimal ST elevation, lateral leads QTC 441  BNP (last 3 results) No results for input(s): "BNP" in the last 8760 hours.   COVID-19 Labs  Recent Labs    08/01/23 1817  CRP 0.7    Lab Results  Component Value Date   SARSCOV2NAA NEGATIVE 08/01/2023   SARSCOV2NAA NEGATIVE 06/23/2023     The recent clinical data is shown below. Vitals:   08/01/23 1327 08/01/23 1735 08/01/23 1800  BP: (!) 163/96 (!) 174/106 (!) 147/78  Pulse: 85 (!) 106 77  Resp: 19 18 17    Temp: 98.2 F (36.8 C) 98.3 F (36.8 C)   SpO2: 97% 100% 99%    WBC     Component Value Date/Time   WBC 7.2 08/01/2023 1350   LYMPHSABS 1.2 08/01/2023 1350   MONOABS 1.1 (H) 08/01/2023 1350   EOSABS 0.6 (H) 08/01/2023 1350   BASOSABS 0.1 08/01/2023 1350      UA  ordered     Results for orders placed or performed during the hospital encounter of 08/01/23  Resp panel by RT-PCR (RSV, Flu A&B, Covid) Anterior Nasal Swab     Status: None   Collection Time: 08/01/23  1:51 PM   Specimen: Anterior Nasal Swab  Result Value Ref Range Status   SARS Coronavirus 2 by RT PCR NEGATIVE NEGATIVE Final   Influenza A by PCR NEGATIVE NEGATIVE Final   Influenza B by PCR NEGATIVE NEGATIVE Final         Resp Syncytial Virus by PCR NEGATIVE NEGATIVE Final           _____________________________________________________ Recent Labs  Lab 08/01/23 1350  NA 139  K 4.4  CO2 22  GLUCOSE 104*  BUN 40*  CREATININE 0.62  CALCIUM 9.1    Cr   stable,    Lab Results  Component Value Date   CREATININE 0.62 08/01/2023   CREATININE 1.35 (H) 06/23/2023   CREATININE 1.20 07/10/2008    Recent Labs  Lab 08/01/23 1350  AST 38  ALT 43  ALKPHOS 113  BILITOT 1.0  PROT 5.8*  ALBUMIN 3.0*   Lab Results  Component Value Date   CALCIUM 9.1 08/01/2023        Plt: Lab Results  Component Value Date   PLT 253 08/01/2023      Recent Labs  Lab 08/01/23 1350  WBC 7.2  NEUTROABS 4.4  HGB 14.7  HCT 44.5  MCV 96.1  PLT 253    HG/HCT  stable,      Component Value Date/Time   HGB 14.7 08/01/2023 1350   HCT 44.5 08/01/2023 1350   MCV 96.1 08/01/2023 1350    _______________________________________________ Hospitalist was called for admission for progressive weakness   The following Work up has been ordered so far:  Orders Placed This Encounter  Procedures   Lumbar Puncture   Resp panel by RT-PCR (RSV, Flu A&B, Covid) Anterior Nasal Swab   CSF culture w Gram Stain   VZV PCR, CSF    MR BRAIN W WO CONTRAST   MR THORACIC SPINE W WO CONTRAST   IR LUMBAR PUNCTURE   DG Chest Port 1 View   DG Abd 1 View   Comprehensive metabolic panel   CBC with Differential   Urinalysis, Routine w reflex microscopic -Urine, Clean Catch   CSF cell count with differential   Protein and glucose, CSF   Meningitis/Encephalitis Panel (  CSF)   Lyme disease dna by pcr(borrelia burg)   IgG CSF index   Draw extra clot tube   Oligoclonal bands, CSF + serum   Draw extra clot tube   Zinc   Copper, serum   Vitamin B12   Methylmalonic acid(mma), rnd urine   Folate   Vitamin E   Vitamin B1   Sedimentation rate   C-reactive protein   Rapid HIV screen (HIV 1/2 Ab+Ag)   Hepatitis panel, acute   Miscellaneous [facility] test (send-out)   Vitamin B6   QuantiFERON-TB Gold Plus   CK   Magnesium   Phosphorus   TSH   Prealbumin   Protime-INR   Urinalysis, Complete w Microscopic -Urine, Clean Catch   Vitamin B12   Folate   VITAMIN D 25 Hydroxy (Vit-D Deficiency, Fractures)   Lumbar puncture tray to bedside   Lumbar Puncture: Order Tray and Sterile Gloves   Informed Consent Details: Physician/Practitioner Attestation; Transcribe to consent form and obtain patient signature   Cardiac Monitoring - Continuous Indefinite   Strict intake and output   Consult to neurology   Consult to hospitalist   ED EKG   EKG 12-Lead   Admit to Inpatient (patient's expected length of stay will be greater than 2 midnights or inpatient only procedure)     OTHER Significant initial  Findings:  labs showing:     DM  labs:  HbA1C: No results for input(s): "HGBA1C" in the last 8760 hours.     CBG (last 3)  No results for input(s): "GLUCAP" in the last 72 hours.        Cultures: No results found for: "SDES", "SPECREQUEST", "CULT", "REPTSTATUS"   Radiological Exams on Admission: NCV with EMG(electromyography) Result Date: 07/31/2023 Antony Madura, MD     08/01/2023 10:02 AM [facility] [address] [place], [address]  [place], [place] [address] Tel: [phone] Fax: [phone] Test Date:  07/31/2023 Patient: Mikale Silversmith DOB: 1940/11/15 Physician: Jacquelyne Balint, MD Sex: Male Height: 5\' 10"  Ref Phys: Jacquelyne Balint, MD ID#: [id number]   Technician:  History: This is an 83 year old male with progressive weakness. NCV & EMG Findings: Extensive electrodiagnostic evaluation of the left upper and lower limbs with needle examination of thoracic paraspinal muscles shows: Left sural and superficial peroneal/fibular sensory responses are absent. Left median, ulnar, and radial sensory responses are within normal limits. Left peroneal/fibular (EDB), peroneal/fibular (TA), tibial (AH), median (APB), and ulnar (ADM) motor responses show reduced amplitudes (EDB 1.03 mV, TA 1.40 mV, AH 3.5 mV, APB 4.8 mV, ADM 6.6 mV). Conduction velocities are normal. Left H reflex is absent. Chronic motor axon loss changes WITH accompanying active denervation changes are seen in left tibialis anterior and pronator teres. Active denervation changes without any observable motor units are seen in left medial head of gastrocnemius, short head of biceps femoris, rectus femoris, and gluteus medius, Chronic motor axon loss changes WITHOUT definitive accompanying active denervation changes are seen in left first dorsal interosseous, extensor indicis proprius, biceps, deltoid, and thoracic paraspinal (T9 level muscles (which patient could not relax). Patient could not tolerate further paraspinal muscle testing due to discomfort. Fasciculations are seen in at least 3 of 12 tested muscles. Impression: This is an abnormal study. The findings are most consistent with the following: There is active/ongoing denervation with reinnervation in all muscles evaluated in the lower extremity with mostly chronic changes seen in the upper extremity. This could be representative of a wide spread, non-length dependent  neuropathy given absent lower extremity  sensory responses. Alternatively, absent lower extremity sensory responses may be due to age, which would make changes more consistent with polyradiculopathy or an evolving disorder of anterior horn cells (ie: motor neuron disease) among other possibilities. There is no definitive evidence of a demyelinating neuropathy. No electrodiagnostic evidence of myopathy. ___________________________ Jacquelyne Balint, MD Nerve Conduction Studies Motor Nerve Results   Latency Amplitude F-Lat Segment Distance CV Comment Site (ms) Norm (mV) Norm (ms)  (cm) (m/s) Norm  Left Fibular (EDB) Motor Ankle 4.5  < 6.0 *1.03  > 2.5       Bel fib head *NR - *NR -  Bel fib head-Ankle - *NR  > 40  Pop fossa *NR - *NR -  Pop fossa-Bel fib head - *NR -  Left Fibular (TA) Motor Fib head 2.9  < 4.5 *1.40  > 3.0       Pop fossa 5.2  < 6.7 1.34 -  Pop fossa-Fib head 10 43  > 40  Left Median (APB) Motor Wrist 2.7  < 4.0 *4.8  > 5.0       Elbow 8.4 - 4.4 -  Elbow-Wrist 28.5 50  > 50  Left Tibial (AH) Motor Ankle 5.5  < 6.0 *3.5  > 4.0       Knee 15.5 - 1.52 -  Knee-Ankle 44 44  > 40  Left Ulnar (ADM) Motor Wrist 2.2  < 3.1 *6.6  > 7.0       Bel elbow 6.4 - 4.5 -  Bel elbow-Wrist 22 52  > 50  Ab elbow 8.3 - 3.8 -  Ab elbow-Bel elbow 10 53 -  Sensory Sites   Neg Peak Lat Amplitude (O-P) Segment Distance Velocity Comment Site (ms) Norm (V) Norm  (cm) (ms)  Left Median Sensory Wrist-Dig II 3.1  < 3.8 15  > 10 Wrist-Dig II 13   Left Radial Sensory Forearm-Wrist 2.5  < 2.8 13  > 10 Forearm-Wrist 10   Left Superficial Fibular Sensory 14 cm-Ankle *NR  < 4.6 *NR  > 3 14 cm-Ankle 14   Left Sural Sensory Calf-Lat mall *NR  < 4.6 *NR  > 3 Calf-Lat mall 14   Left Ulnar Sensory Wrist-Dig V 2.9  < 3.2 6  > 5 Wrist-Dig V 11   H-Reflex Results   M-Lat H Lat H Neg Amp H-M Lat Site (ms) (ms) Norm (mV) (ms) Left Tibial H-Reflex Pop fossa 7.9 -  < 35.0 - - Electromyography  Side Muscle Ins.Act Fibs Fasc Recrt Amp Dur Poly Activation Comment Left FDI Nml Nml *1+ *3- *1+ *1+  *1+ *1- N/A Left EIP Nml Nml *1+ *2- *1+ *1+ *1+ *1- N/A Left Pronator teres Nml *2+ Nml *2- *1+ *1+ *1+ *1- N/A Left Biceps Nml Nml *1+ *3- *1+ *1+ *1+ *1- N/A Left Triceps Nml Nml Nml *None *- *- *- *2- N/A Left Deltoid Nml Nml Nml *2- Nml Nml Nml *2- N/A Left Tib ant Nml *2+ Nml *3- *1+ *1+ *1+ *2- N/A Left Gastroc MH Nml *1+ Nml *None *- *- *- *None N/A Left Rectus fem Nml *2+ Nml *None *- *- *- *None N/A Left Biceps fem SH Nml *2+ Nml *None *- *- *- *None N/A Left T9 PSP Nml Nml Nml *1- *1+ *1+ *1+ Nml Unable to relax Left Gluteus med Nml *1+ Nml *None *- *- *- *None N/A Waveforms: Motor         Sensory  H-Reflex    _______________________________________________________________________________________________________ Latest  Blood pressure (!) 147/78, pulse 77, temperature 98.3 F (36.8 C), resp. rate 17, SpO2 99%.   Vitals  labs and radiology finding personally reviewed  Review of Systems:    Pertinent positives include:   neurological complaints, progressive lower and upper extremity weakness Constitutional:  No weight loss, night sweats, Fevers, chills, fatigue, weight loss  HEENT:  No headaches, Difficulty swallowing,Tooth/dental problems,Sore throat,  No sneezing, itching, ear ache, nasal congestion, post nasal drip,  Cardio-vascular:  No chest pain, Orthopnea, PND, anasarca, dizziness, palpitations.no Bilateral lower extremity swelling  GI:  No heartburn, indigestion, abdominal pain, nausea, vomiting, diarrhea, change in bowel habits, loss of appetite, melena, blood in stool, hematemesis Resp:  no shortness of breath at rest. No dyspnea on exertion, No excess mucus, no productive cough, No non-productive cough, No coughing up of blood.No change in color of mucus.No wheezing. Skin:  no rash or lesions. No jaundice GU:  no dysuria, change in color of urine, no urgency or frequency. No straining to urinate.  No flank pain.  Musculoskeletal:  No joint pain or no joint  swelling. No decreased range of motion. No back pain.  Psych:  No change in mood or affect. No depression or anxiety. No memory loss.  Neuro: no localizing  no tingling, no weakness, no double vision, no gait abnormality, no slurred speech, no confusion  All systems reviewed and apart from HOPI all are negative _______________________________________________________________________________________________ Past Medical History:   Past Medical History:  Diagnosis Date   Hearing impaired person, bilateral    wears hearing aids   Hypertension       Past Surgical History:  Procedure Laterality Date   APPENDECTOMY     laparoscopic   CATARACT EXTRACTION, BILATERAL     COLONOSCOPY WITH PROPOFOL N/A 03/07/2016   Procedure: COLONOSCOPY WITH PROPOFOL;  Surgeon: Charolett Bumpers, MD;  Location: [facility];  Service: Endoscopy;  Laterality: N/A;   VASECTOMY      Social History:  Ambulatory   wheelchair bound,       reports that he has never smoked. He has never used smokeless tobacco. He reports that he does not drink alcohol and does not use drugs.     Family History: No family history of neurological disorder ______________________________________________________________________________________________ Allergies: No Known Allergies   Prior to Admission medications   Medication Sig Start Date End Date Taking? Authorizing Provider  acetaminophen (TYLENOL) 325 MG tablet Take 650 mg by mouth every 12 (twelve) hours as needed.    [provider]  cetirizine (ZYRTEC) 10 MG chewable tablet Chew 10 mg by mouth daily.    [provider]  cholecalciferol (VITAMIN D3) 25 MCG (1000 UNIT) tablet Take 1,000 Units by mouth daily.    [provider]  hydrochlorothiazide (HYDRODIURIL) 25 MG tablet Take 12.5 mg by mouth at bedtime. Patient not taking: Reported on 07/21/2023    [provider]  ibuprofen (ADVIL) 200 MG tablet Take 400 mg by mouth every 12  (twelve) hours as needed.    [provider]  lisinopril (ZESTRIL) 10 MG tablet Take 10 mg by mouth daily. Patient not taking: Reported on 07/21/2023 06/08/23   [provider]  Multiple Vitamins-Minerals (MENS MULTIPLUS PO) Take 1 tablet by mouth daily.    [provider]  sertraline (ZOLOFT) 25 MG tablet Take 25 mg by mouth daily. Patient not taking: Reported on 06/23/2023 06/16/23   [provider]    ___________________________________________________________________________________________________ Physical Exam:  08/01/2023    6:00 PM 08/01/2023    5:35 PM 08/01/2023    1:27 PM  Vitals with BMI  Systolic 147 174 161  Diastolic 78 106 96  Pulse 77 106 85     1. General:  in No  Acute distress   Chronically ill   -appearing 2. Psychological: Alert and   Oriented 3. Head/ENT:    Dry Mucous Membranes                          Head Non traumatic, neck supple                         Poor Dentition 4. SKIN:  decreased Skin turgor,  Skin clean Dry and intact no rash    5. Heart: Regular rate and rhythm no  Murmur, no Rub or gallop 6. Lungs: no wheezes or crackles   7. Abdomen: Soft,  non-tender, Non distended bowel sounds present 8. Lower extremities: no clubbing, cyanosis, no  edema 9. Neurologically diminished strength in lower extremities more than the upper extremities but overall throughout cranial nerves appear to be intact 10. MSK: Normal range of motion    Chart has been reviewed  ______________________________________________________________________________________________  Assessment/Plan  83 y.o. male with medical history significant of COPD, HTN, HLD, HOH, OSA  Admitted for progressive weakness GBS evaluation   Present on Admission:  GBS (Guillain Barre syndrome) (HCC)  COPD (chronic obstructive pulmonary disease) (HCC)  Essential hypertension     GBS (Guillain Barre syndrome) [facility]) Neurology recommend extensive work up  for  Possible GBS Appreciate their consult LP attempted in ER but not successful  Admitted for IR LP  Monitoring respiratory status And rehydration  COPD (chronic obstructive pulmonary disease) (HCC) Chronic stable  Continue home meds  Depression Pt not taking Zoloft  Essential hypertension Pt no longer takes Lisinopril   Other plan as per orders.  DVT prophylaxis:  SCD      Code Status:    Code Status: Not on file FULL CODE as per patient   I had personally discussed CODE STATUS with patient   ACP   has been reviewed     Family Communication:   Family not at  Bedside    Diet dysphagia diet Will need SLP eval   Disposition Plan:     likely will need placement for rehabilitation                            Following barriers for discharge:                                                       Will need consultants to evaluate patient prior to discharge       Consult Orders  (From admission, onward)           Start     Ordered   08/01/23 1832  Consult to hospitalist  Pg by Viviann Spare  Once       Provider:  (Not yet assigned)  Question Answer Comment  Place call to: [facility]   Reason for Consult Admit      08/01/23 1831  Would benefit from PT/OT eval prior to DC  Ordered                   Swallow eval - SLP ordered                                       Transition of care consulted                   Nutrition    consulted                                       Consults called:  neurology is aware   Admission status:  ED Disposition     ED Disposition  Admit   Condition  --   Comment  Hospital Area: [facility] [[id number]]  Level of Care: Progressive [102]  Admit to Progressive based on following criteria: NEUROLOGICAL AND NEUROSURGICAL complex patients with significant risk of instability, who do not meet ICU criteria, yet require close observation or frequent assessment (< / = every 2 - 4  hours) with medical / nursing intervention.  May admit patient to [facility] or [facility] if equivalent level of care is available:: No  Covid Evaluation: Asymptomatic - no recent exposure (last 10 days) testing not required  Diagnosis: GBS (Guillain Barre syndrome) [facility]) [[id number]]  Admitting Physician: Therisa Doyne [[id number]]  Attending Physician: Therisa Doyne [[id number]]  Certification:: I certify this patient will need inpatient services for at least 2 midnights  Expected Medical Readiness: 08/04/2023            inpatient     I Expect 2 midnight stay secondary to severity of patient's current illness need for inpatient interventions justified by the following:  Severe lab/radiological/exam abnormalities including:    Possible GBS and extensive comorbidities including:  COPD/asthma   That are currently affecting medical management.   I expect  patient to be hospitalized for 2 midnights requiring inpatient medical care.  Patient is at high risk for adverse outcome (such as loss of life or disability) if not treated.  Indication for inpatient stay as follows:   inability to maintain oral hydration   Need for operative/procedural  intervention     Need for  IV fluids,     Level of care      progressive       Lab Results  Component Value Date   SARSCOV2NAA NEGATIVE 08/01/2023     Precautions: admitted as   Covid Negative     Samarra Ridgely 08/01/2023, 8:07 PM    [facility]     after 2 AM please page floor coverage PA If 7AM-7PM, please contact the day team taking care of the patient using [email or web address]

## 2023-08-01 NOTE — Assessment & Plan Note (Signed)
Chronic stable Continue home meds

## 2023-08-01 NOTE — ED Notes (Signed)
 Wife took hearing aids home to charge.

## 2023-08-01 NOTE — ED Triage Notes (Signed)
 PT BIB EMS for generalized weakness and dehydration and left leg cramping r/t poor PO intake and frequent urination. Wife has instructions from PCP that the pt needs to be admitted r/t outpatient workup of MRI's and an EMG.  PT is fairly lucide, wife is good historian.   164/76 98% 86 RR 20 CBG 151 T: 98.1

## 2023-08-01 NOTE — ED Notes (Signed)
 Pt to MRI

## 2023-08-01 NOTE — Telephone Encounter (Signed)
 Pt's wife called in and left a message. She stated they are at the hospital waiting for a bed. She wanted to keep [place] updated.

## 2023-08-01 NOTE — ED Provider Notes (Signed)
 [place] EMERGENCY DEPARTMENT AT [facility] Provider Note   CSN: [id number] Arrival date & time: 08/01/23  1318     History  No chief complaint on file.   Peter Stone is a 83 y.o. male.  The history is provided by the patient, the spouse and medical records. No language interpreter was used.     Patient is an 83 year old male with PMH of COPD. History delivered primarily by wife, primary caregiver. Presents today for dehydration and anorexia. Patient's wife states that starting in December the patient and herself developed a viral infection where the patient had a significant cough. Since that time the patient began experiencing progressive weakness and pain in his bilateral lower extremities. February 21st he had a fall and presented to the ED and has not been able to walk since. Continues to have pain and weakness and has new weakness in bilateral upper extremities. Followed closely by Neuro, Dr. Loleta Chance, who has performed MRI of cervical and lumbar spine, as well as performed EMG. Scheduled for lumbar puncture due to suspicion of GBS. Patient denies dysphagia however wife states he has asked her to cut up food into smaller pieces. Questionable urinary/fecal incontinence, patient currently wearing diaper. Endorses urinary urgency and frequency. Complains of groin discomfort and bilateral numbness of the feet. Denies saddle anesthesia at this time. Additionally he denies nausea, vomiting, headaches, chest pain, or SOB.   Home Medications Prior to Admission medications   Medication Sig Start Date End Date Taking? Authorizing Provider  acetaminophen (TYLENOL) 325 MG tablet Take 650 mg by mouth every 12 (twelve) hours as needed.    [provider]  cetirizine (ZYRTEC) 10 MG chewable tablet Chew 10 mg by mouth daily.    [provider]  cholecalciferol (VITAMIN D3) 25 MCG (1000 UNIT) tablet Take 1,000 Units by mouth daily.    [provider]   hydrochlorothiazide (HYDRODIURIL) 25 MG tablet Take 12.5 mg by mouth at bedtime. Patient not taking: Reported on 07/21/2023    [provider]  ibuprofen (ADVIL) 200 MG tablet Take 400 mg by mouth every 12 (twelve) hours as needed.    [provider]  lisinopril (ZESTRIL) 10 MG tablet Take 10 mg by mouth daily. Patient not taking: Reported on 07/21/2023 06/08/23   [provider]  Multiple Vitamins-Minerals (MENS MULTIPLUS PO) Take 1 tablet by mouth daily.    [provider]  sertraline (ZOLOFT) 25 MG tablet Take 25 mg by mouth daily. Patient not taking: Reported on 06/23/2023 06/16/23   [provider]      Allergies    Patient has no known allergies.    Review of Systems   Review of Systems  All other systems reviewed and are negative.   Physical Exam Updated Vital Signs BP (!) 163/96   Pulse 85   Temp 98.2 F (36.8 C)   Resp 19   SpO2 97%  Physical Exam Vitals and nursing note reviewed.  Constitutional:      General: He is not in acute distress.    Appearance: He is well-developed.     Comments: Arn Medal elderly male laying in bed in no acute discomfort.  HENT:     Head: Atraumatic.  Eyes:     Conjunctiva/sclera: Conjunctivae normal.  Cardiovascular:     Rate and Rhythm: Normal rate and regular rhythm.     Pulses: Normal pulses.     Heart sounds: Normal heart sounds.  Pulmonary:     Effort: Pulmonary  effort is normal.     Breath sounds: Normal breath sounds.  Abdominal:     Palpations: Abdomen is soft.     Tenderness: There is no abdominal tenderness.  Musculoskeletal:     Cervical back: Normal range of motion and neck supple. No rigidity.     Comments: 4 of 5 strength to RUE, 3 of 5 strength to LUE 2 of 5 strength to bilateral lower extremities Sensation intact to all 4 extremities  Skin:    Findings: No rash.  Neurological:     Mental Status: He is alert.     Comments: Voice is hoarse     ED Results /  Procedures / Treatments   Labs (all labs ordered are listed, but only abnormal results are displayed) Labs Reviewed  COMPREHENSIVE METABOLIC PANEL WITH GFR - Abnormal; Notable for the following components:      Result Value   Glucose, Bld 104 (*)    BUN 40 (*)    Total Protein 5.8 (*)    Albumin 3.0 (*)    All other components within normal limits  CBC WITH DIFFERENTIAL/PLATELET - Abnormal; Notable for the following components:   RDW 15.7 (*)    Monocytes Absolute 1.1 (*)    Eosinophils Absolute 0.6 (*)    All other components within normal limits  RESP PANEL BY RT-PCR (RSV, FLU A&B, COVID)  RVPGX2  CSF CULTURE W GRAM STAIN  VZV PCR, CSF  URINALYSIS, ROUTINE W REFLEX MICROSCOPIC  CSF CELL COUNT WITH DIFFERENTIAL  PROTEIN AND GLUCOSE, CSF  MENINGITIS/ENCEPHALITIS PANEL (CSF)  LYME DISEASE DNA BY PCR(BORRELIA BURG)  IGG CSF INDEX  DRAW EXTRA CLOT TUBE  OLIGOCLONAL BANDS, CSF + SERM  DRAW EXTRA CLOT TUBE  ZINC  COPPER, SERUM  VITAMIN B12  METHYLMALONIC ACID(MMA), RND URINE  FOLATE  VITAMIN E  VITAMIN B1  SEDIMENTATION RATE  C-REACTIVE PROTEIN  RAPID HIV SCREEN (HIV 1/2 AB+AG)  HEPATITIS PANEL, ACUTE  MISC [facility] TEST (SEND OUT)  VITAMIN B6  QUANTIFERON-TB GOLD PLUS    EKG None ED ECG REPORT   Date: 08/01/2023  Rate: 78  Rhythm: normal sinus rhythm  QRS Axis: left  Intervals: normal  ST/T Wave abnormalities: nonspecific ST changes  Conduction Disutrbances:right bundle branch block and left anterior fascicular block  Narrative Interpretation:   Old EKG Reviewed: unchanged  I have personally reviewed the EKG tracing and agree with the computerized printout as noted.   Radiology NCV with EMG(electromyography) Result Date: 07/31/2023 Antony Madura, MD     08/01/2023 10:02 AM [facility] [address] [place], [address]  [place], [place] [address] Tel: [phone] Fax: [phone] Test Date:  07/31/2023 Patient: Peter Stone DOB: 1940/06/07 Physician:  Jacquelyne Balint, MD Sex: Male Height: 5\' 10"  Ref Phys: Jacquelyne Balint, MD ID#: [id number]   Technician:  History: This is an 83 year old male with progressive weakness. NCV & EMG Findings: Extensive electrodiagnostic evaluation of the left upper and lower limbs with needle examination of thoracic paraspinal muscles shows: Left sural and superficial peroneal/fibular sensory responses are absent. Left median, ulnar, and radial sensory responses are within normal limits. Left peroneal/fibular (EDB), peroneal/fibular (TA), tibial (AH), median (APB), and ulnar (ADM) motor responses show reduced amplitudes (EDB 1.03 mV, TA 1.40 mV, AH 3.5 mV, APB 4.8 mV, ADM 6.6 mV). Conduction velocities are normal. Left H reflex is absent. Chronic motor axon loss changes WITH accompanying active denervation changes are seen in left tibialis anterior and pronator teres. Active denervation  changes without any observable motor units are seen in left medial head of gastrocnemius, short head of biceps femoris, rectus femoris, and gluteus medius, Chronic motor axon loss changes WITHOUT definitive accompanying active denervation changes are seen in left first dorsal interosseous, extensor indicis proprius, biceps, deltoid, and thoracic paraspinal (T9 level muscles (which patient could not relax). Patient could not tolerate further paraspinal muscle testing due to discomfort. Fasciculations are seen in at least 3 of 12 tested muscles. Impression: This is an abnormal study. The findings are most consistent with the following: There is active/ongoing denervation with reinnervation in all muscles evaluated in the lower extremity with mostly chronic changes seen in the upper extremity. This could be representative of a wide spread, non-length dependent neuropathy given absent lower extremity sensory responses. Alternatively, absent lower extremity sensory responses may be due to age, which would make changes more consistent with polyradiculopathy or an  evolving disorder of anterior horn cells (ie: motor neuron disease) among other possibilities. There is no definitive evidence of a demyelinating neuropathy. No electrodiagnostic evidence of myopathy. ___________________________ Jacquelyne Balint, MD Nerve Conduction Studies Motor Nerve Results   Latency Amplitude F-Lat Segment Distance CV Comment Site (ms) Norm (mV) Norm (ms)  (cm) (m/s) Norm  Left Fibular (EDB) Motor Ankle 4.5  < 6.0 *1.03  > 2.5       Bel fib head *NR - *NR -  Bel fib head-Ankle - *NR  > 40  Pop fossa *NR - *NR -  Pop fossa-Bel fib head - *NR -  Left Fibular (TA) Motor Fib head 2.9  < 4.5 *1.40  > 3.0       Pop fossa 5.2  < 6.7 1.34 -  Pop fossa-Fib head 10 43  > 40  Left Median (APB) Motor Wrist 2.7  < 4.0 *4.8  > 5.0       Elbow 8.4 - 4.4 -  Elbow-Wrist 28.5 50  > 50  Left Tibial (AH) Motor Ankle 5.5  < 6.0 *3.5  > 4.0       Knee 15.5 - 1.52 -  Knee-Ankle 44 44  > 40  Left Ulnar (ADM) Motor Wrist 2.2  < 3.1 *6.6  > 7.0       Bel elbow 6.4 - 4.5 -  Bel elbow-Wrist 22 52  > 50  Ab elbow 8.3 - 3.8 -  Ab elbow-Bel elbow 10 53 -  Sensory Sites   Neg Peak Lat Amplitude (O-P) Segment Distance Velocity Comment Site (ms) Norm (V) Norm  (cm) (ms)  Left Median Sensory Wrist-Dig II 3.1  < 3.8 15  > 10 Wrist-Dig II 13   Left Radial Sensory Forearm-Wrist 2.5  < 2.8 13  > 10 Forearm-Wrist 10   Left Superficial Fibular Sensory 14 cm-Ankle *NR  < 4.6 *NR  > 3 14 cm-Ankle 14   Left Sural Sensory Calf-Lat mall *NR  < 4.6 *NR  > 3 Calf-Lat mall 14   Left Ulnar Sensory Wrist-Dig V 2.9  < 3.2 6  > 5 Wrist-Dig V 11   H-Reflex Results   M-Lat H Lat H Neg Amp H-M Lat Site (ms) (ms) Norm (mV) (ms) Left Tibial H-Reflex Pop fossa 7.9 -  < 35.0 - - Electromyography  Side Muscle Ins.Act Fibs Fasc Recrt Amp Dur Poly Activation Comment Left FDI Nml Nml *1+ *3- *1+ *1+ *1+ *1- N/A Left EIP Nml Nml *1+ *2- *1+ *1+ *1+ *1- N/A Left Pronator teres Nml *2+ Nml *2- *1+ *1+ *1+ *  1- N/A Left Biceps Nml Nml *1+ *3- *1+ *1+ *1+ *1- N/A Left  Triceps Nml Nml Nml *None *- *- *- *2- N/A Left Deltoid Nml Nml Nml *2- Nml Nml Nml *2- N/A Left Tib ant Nml *2+ Nml *3- *1+ *1+ *1+ *2- N/A Left Gastroc MH Nml *1+ Nml *None *- *- *- *None N/A Left Rectus fem Nml *2+ Nml *None *- *- *- *None N/A Left Biceps fem SH Nml *2+ Nml *None *- *- *- *None N/A Left T9 PSP Nml Nml Nml *1- *1+ *1+ *1+ Nml Unable to relax Left Gluteus med Nml *1+ Nml *None *- *- *- *None N/A Waveforms: Motor         Sensory         H-Reflex     Procedures Lumbar Puncture  Date/Time: 08/01/2023 5:37 PM  Performed by: Fayrene Helper, PA-C Authorized by: Fayrene Helper, PA-C   Consent:    Consent obtained:  Verbal and written   Consent given by:  Patient and spouse   Risks, benefits, and alternatives were discussed: yes     Risks discussed:  Bleeding, infection, pain, headache, nerve damage and repeat procedure   Alternatives discussed:  No treatment and alternative treatment Universal protocol:    Procedure explained and questions answered to patient or proxy's satisfaction: yes     Relevant documents present and verified: yes     Immediately prior to procedure a time out was called: yes     Patient identity confirmed:  Verbally with patient and arm band Pre-procedure details:    Procedure purpose:  Diagnostic Anesthesia:    Anesthesia method:  Local infiltration   Local anesthetic:  Lidocaine 2% w/o epi Procedure details:    Lumbar space:  L3-L4 interspace   Patient position:  Sitting   Needle gauge:  20   Needle type:  Spinal needle - Quincke tip   Needle length (in):  2.5   Ultrasound guidance: no     Number of attempts:  2 Post-procedure details:    Procedure completion:  Procedure terminated electively by provider Comments:     Unsuccessful LP despite two attempts     Medications Ordered in ED Medications  lidocaine (XYLOCAINE) 2 % (with pres) injection 200 mg (has no administration in time range)  morphine (PF) 4 MG/ML injection 4 mg (has no  administration in time range)  sodium chloride 0.9 % bolus 1,000 mL (1,000 mLs Intravenous New Bag/Given 08/01/23 1815)    ED Course/ Medical Decision Making/ A&P                                 Medical Decision Making Amount and/or Complexity of Data Reviewed Labs: ordered.  Risk Prescription drug management.   BP (!) 163/96   Pulse 85   Temp 98.2 F (36.8 C)   Resp 19   SpO2 97%   4:37 PM  Patient is an 83 year old male with PMH of COPD. History delivered primarily by wife, primary caregiver. Presents today for dehydration and anorexia. Patient's wife states that starting in December the patient and herself developed a viral infection where the patient had a significant cough. Since that time the patient began experiencing progressive weakness and pain in his bilateral lower extremities. February 21st he had a fall and presented to the ED and has not been able to walk since. Continues to have pain and weakness and has new weakness in  bilateral upper extremities. Followed closely by Neuro, Dr. Loleta Chance, who has performed MRI of cervical and lumbar spine, as well as performed EMG. Scheduled for lumbar puncture due to suspicion of GBS. Patient denies dysphagia however wife states he has asked her to cut up food into smaller pieces. Questionable urinary/fecal incontinence, patient currently wearing diaper. Endorses urinary urgency and frequency. Complains of groin discomfort and bilateral numbness of the feet. Denies saddle anesthesia at this time. Additionally he denies nausea, vomiting, headaches, chest pain, or SOB.   On exam this is an elderly male laying in bed voice is hoarse, not in any acute discomfort.  He is globally weak with almost flaccidity of his lower extremities and weakness to his upper extremities.  Heart with normal rate rhythm, lungs clear abdomen soft nontender  Vital signs notable for elevated blood pressure 163/96.  Patient is afebrile no hypoxia.  -Labs ordered,  independently viewed and interpreted by me.  Labs remarkable for result pending -The patient was maintained on a cardiac monitor.  I personally viewed and interpreted the cardiac monitored which showed an underlying rhythm of: NSR -Imaging including MRIs ordered but have not resulted -This patient presents to the ED for concern of weakness, this involves an extensive number of treatment options, and is a complaint that carries with it a high risk of complications and morbidity.  The differential diagnosis includes GBS, MG, encephalitis, infectious, electrolytes derangement, stroke, MS -Co morbidities that complicate the patient evaluation includes HTN -Treatment includes IVF -Reevaluation of the patient after these medicines showed that the patient stayed the same -PCP office notes or outside notes reviewed -Discussion with attending Dr. Adela Lank.  I have also request IR for LP.  I have consulted hospitalist DR. Doutova who agrees to admit pt -Escalation to admission/observation considered: patient is agreeable with admission         Final Clinical Impression(s) / ED Diagnoses Final diagnoses:  Weakness    Rx / DC Orders ED Discharge Orders     None         Fayrene Helper, PA-C 08/01/23 1910    Melene Plan, DO 08/01/23 1918

## 2023-08-02 ENCOUNTER — Inpatient Hospital Stay (HOSPITAL_COMMUNITY)

## 2023-08-02 DIAGNOSIS — E43 Unspecified severe protein-calorie malnutrition: Secondary | ICD-10-CM | POA: Insufficient documentation

## 2023-08-02 DIAGNOSIS — J42 Unspecified chronic bronchitis: Secondary | ICD-10-CM | POA: Diagnosis not present

## 2023-08-02 DIAGNOSIS — I10 Essential (primary) hypertension: Secondary | ICD-10-CM | POA: Diagnosis not present

## 2023-08-02 DIAGNOSIS — G610 Guillain-Barre syndrome: Secondary | ICD-10-CM | POA: Diagnosis not present

## 2023-08-02 LAB — MENINGITIS/ENCEPHALITIS PANEL (CSF)

## 2023-08-02 LAB — PROTIME-INR
INR: 1 (ref 0.8–1.2)
Prothrombin Time: 13.8 s (ref 11.4–15.2)

## 2023-08-02 LAB — TSH: TSH: 1.046 u[IU]/mL (ref 0.350–4.500)

## 2023-08-02 LAB — COMPREHENSIVE METABOLIC PANEL WITH GFR
ALT: 41 U/L (ref 0–44)
AST: 33 U/L (ref 15–41)
Albumin: 2.6 g/dL — ABNORMAL LOW (ref 3.5–5.0)
Alkaline Phosphatase: 98 U/L (ref 38–126)
Anion gap: 8 (ref 5–15)
BUN: 36 mg/dL — ABNORMAL HIGH (ref 8–23)
CO2: 22 mmol/L (ref 22–32)
Calcium: 8.9 mg/dL (ref 8.9–10.3)
Chloride: 110 mmol/L (ref 98–111)
Creatinine, Ser: 0.6 mg/dL — ABNORMAL LOW (ref 0.61–1.24)
GFR, Estimated: >60 mL/min (ref >60)
Glucose, Bld: 95 mg/dL (ref 70–99)
Potassium: 3.8 mmol/L (ref 3.5–5.1)
Sodium: 140 mmol/L (ref 135–145)
Total Bilirubin: 1 mg/dL (ref 0.0–1.2)
Total Protein: 4.5 g/dL — ABNORMAL LOW (ref 6.5–8.1)

## 2023-08-02 LAB — CBC
HCT: 40.1 % (ref 39.0–52.0)
Hemoglobin: 13.4 g/dL (ref 13.0–17.0)
MCH: 31.7 pg (ref 26.0–34.0)
MCHC: 33.4 g/dL (ref 30.0–36.0)
MCV: 94.8 fL (ref 80.0–100.0)
Platelets: 247 10*3/uL (ref 150–400)
RBC: 4.23 MIL/uL (ref 4.22–5.81)
RDW: 15.6 % — ABNORMAL HIGH (ref 11.5–15.5)
WBC: 8 10*3/uL (ref 4.0–10.5)
nRBC: 0 % (ref 0.0–0.2)

## 2023-08-02 LAB — GLUCOSE, CAPILLARY
Glucose-Capillary: 83 mg/dL (ref 70–99)
Glucose-Capillary: 153 mg/dL — ABNORMAL HIGH (ref 70–99)
Glucose-Capillary: 112 mg/dL — ABNORMAL HIGH (ref 70–99)

## 2023-08-02 LAB — CSF CELL COUNT WITH DIFFERENTIAL
RBC Count, CSF: 1 /mm3 — ABNORMAL HIGH
Tube #: 3
WBC, CSF: 2 /mm3 (ref 0–5)

## 2023-08-02 LAB — PREALBUMIN: Prealbumin: 17 mg/dL — ABNORMAL LOW (ref 18–38)

## 2023-08-02 LAB — PHOSPHORUS
Phosphorus: 3.3 mg/dL (ref 2.5–4.6)
Phosphorus: 3.4 mg/dL (ref 2.5–4.6)

## 2023-08-02 LAB — PROTEIN AND GLUCOSE, CSF
Glucose, CSF: 64 mg/dL (ref 40–70)
Total  Protein, CSF: 93 mg/dL — ABNORMAL HIGH (ref 15–45)

## 2023-08-02 LAB — MAGNESIUM
Magnesium: 2.1 mg/dL (ref 1.7–2.4)
Magnesium: 2 mg/dL (ref 1.7–2.4)

## 2023-08-02 LAB — CK: Total CK: 60 U/L (ref 49–397)

## 2023-08-02 LAB — VITAMIN D 25 HYDROXY (VIT D DEFICIENCY, FRACTURES): Vit D, 25-Hydroxy: 67.52 ng/mL (ref 30–100)

## 2023-08-02 MED ORDER — LIDOCAINE 1 % OPTIME INJ - NO CHARGE
5.0000 mL | Freq: Once | INTRAMUSCULAR | Status: AC
  Administered 2023-08-02: 3 mL via INTRADERMAL
  Filled 2023-08-02: qty 6

## 2023-08-02 MED ORDER — LORAZEPAM 2 MG/ML IJ SOLN
0.5000 mg | Freq: Once | INTRAMUSCULAR | Status: AC
  Administered 2023-08-02: 0.5 mg via INTRAVENOUS
  Filled 2023-08-02: qty 1

## 2023-08-02 MED ORDER — ENSURE ENLIVE PO LIQD
237.0000 mL | Freq: Two times a day (BID) | ORAL | Status: DC
  Administered 2023-08-02: 237 mL via ORAL

## 2023-08-02 MED ORDER — BOOST PLUS PO LIQD
237.0000 mL | Freq: Three times a day (TID) | ORAL | Status: DC
  Administered 2023-08-03 – 2023-08-09 (×14): 237 mL via ORAL
  Filled 2023-08-02 (×27): qty 237

## 2023-08-02 MED ORDER — HALOPERIDOL LACTATE 5 MG/ML IJ SOLN
5.0000 mg | Freq: Once | INTRAMUSCULAR | Status: AC | PRN
  Administered 2023-08-02: 5 mg via INTRAVENOUS
  Filled 2023-08-02: qty 1

## 2023-08-02 MED ORDER — ADULT MULTIVITAMIN W/MINERALS CH
1.0000 | ORAL_TABLET | Freq: Every day | ORAL | Status: DC
  Administered 2023-08-02 – 2023-08-09 (×8): 1 via ORAL
  Filled 2023-08-02 (×9): qty 1

## 2023-08-02 MED ORDER — AMLODIPINE BESYLATE 5 MG PO TABS
5.0000 mg | ORAL_TABLET | Freq: Every day | ORAL | Status: DC
  Administered 2023-08-02 – 2023-08-06 (×5): 5 mg via ORAL
  Filled 2023-08-02 (×5): qty 1

## 2023-08-02 NOTE — Progress Notes (Signed)
 LP site band-aid clean, dry, intact

## 2023-08-02 NOTE — Progress Notes (Signed)
 NIF: -24 with best effort.

## 2023-08-02 NOTE — [place] FL2 (Signed)
 [place] MEDICAID FL2 LEVEL OF CARE FORM     IDENTIFICATION  Patient Name: Peter Stone Birthdate: [birth year] Sex: male Admission Date (Current Location): 08/01/2023  [facility] and [place] Number:  Producer, television/film/video and Address:  [facility] [place], [address], [place], [place] [address]      Provider Number: [id number]  Attending Physician Name and Address:  Maretta Bees, MD  Relative Name and Phone Number:       Current Level of Care: Hospital Recommended Level of Care: Skilled Nursing Facility Prior Approval Number:    Date Approved/Denied:   PASRR Number: [id number]  Discharge Plan: SNF    Current Diagnoses: Patient Active Problem List   Diagnosis Date Noted   Protein-calorie malnutrition, severe 08/02/2023   GBS (Guillain Barre syndrome) (HCC) 08/01/2023   COPD (chronic obstructive pulmonary disease) (HCC) 08/01/2023   Essential hypertension 08/01/2023    Orientation RESPIRATION BLADDER Height & Weight     Self, Time, Situation, Place  Normal Incontinent Weight: 139 lb 8.8 oz (63.3 kg) Height:  5\' 10"  (177.8 cm)  BEHAVIORAL SYMPTOMS/MOOD NEUROLOGICAL BOWEL NUTRITION STATUS      Incontinent Diet (See DC Summary)  AMBULATORY STATUS COMMUNICATION OF NEEDS Skin   Extensive Assist Verbally Normal                       Personal Care Assistance Level of Assistance  Feeding, Dressing, Bathing Bathing Assistance: Maximum assistance Feeding assistance: Limited assistance Dressing Assistance: Maximum assistance     Functional Limitations Info  Hearing   Hearing Info: Impaired Speech Info: Adequate    SPECIAL CARE FACTORS FREQUENCY  PT (By licensed PT), OT (By licensed OT)     PT Frequency: 5x/week OT Frequency: 5x/week            Contractures Contractures Info: Not present    Additional Factors Info  Code Status, Allergies Code Status Info: Full Allergies Info: NKA           Current Medications  (08/02/2023):  This is the current hospital active medication list Current Facility-Administered Medications  Medication Dose Route Frequency Provider Last Rate Last Admin   acetaminophen (TYLENOL) tablet 650 mg  650 mg Oral Q6H PRN Therisa Doyne, MD       Or   acetaminophen (TYLENOL) suppository 650 mg  650 mg Rectal Q6H PRN Doutova, Anastassia, MD       albuterol (PROVENTIL) (2.5 MG/3ML) 0.083% nebulizer solution 2.5 mg  2.5 mg Nebulization Q2H PRN Doutova, Anastassia, MD       amLODipine (NORVASC) tablet 5 mg  5 mg Oral Daily Ghimire, Werner Lean, MD   5 mg at 08/02/23 1015   HYDROcodone-acetaminophen (NORCO/VICODIN) 5-325 MG per tablet 1-2 tablet  1-2 tablet Oral Q4H PRN Therisa Doyne, MD       lactose free nutrition (BOOST PLUS) liquid 237 mL  237 mL Oral TID WC Ghimire, Shanker M, MD       lidocaine (XYLOCAINE) 2 % (with pres) injection 200 mg  10 mL Intradermal Once Doutova, Anastassia, MD       multivitamin with minerals tablet 1 tablet  1 tablet Oral Daily Ghimire, Werner Lean, MD       ondansetron (ZOFRAN) tablet 4 mg  4 mg Oral Q6H PRN Doutova, Anastassia, MD       Or   ondansetron (ZOFRAN) injection 4 mg  4 mg Intravenous Q6H PRN Therisa Doyne, MD  Discharge Medications: Please see discharge summary for a list of discharge medications.  Relevant Imaging Results:  Relevant Lab Results:   Additional Information SSN: [id number]  Renne Crigler Kayon Dozier, LCSW

## 2023-08-02 NOTE — TOC Initial Note (Signed)
 Transition of Care [facility]) - Initial/Assessment Note    Patient Details  Name: Peter Stone MRN: [id number] Date of Birth: [birth year]  Transition of Care [facility]) CM/SW Contact:    Mearl Latin, LCSW Phone Number: 08/02/2023, 12:31 PM  Clinical Narrative:                 CSW received consult for possible SNF placement at time of discharge. CSW spoke with patient' spouse at bedside as patient was sleeping soundly. Patient's spouse reported that she has been thinking about option of rehab inpatient. CSW provided clarification that the level of care recommended was SNF not CIR. She reported understanding as another staff member told her CIR was recommended. CSW discussed insurance authorization process for SNF and will provide Medicare SNF ratings list. CSW will send out referrals for review and provide bed offers as available. She reported agreement with plan but reports patient has previously been against facility placement. Will assess with patient once he is awake. He is active with [facility]. Patient has a cpap at home but refuses to wear it. Patient has hospital bed, Rolling walker, and bedside commode in the home.     Skilled Nursing Rehab Facilities-   [email or web address]   Ratings out of 5 stars (5 the highest)   Name Address  Phone # Quality Care Staffing Health Inspection Overall  [facility] [address] [place] [phone] 3 3 4 4   [facility] [address], [place] [phone] 5 1 4 4   [facility] [address], [facility] [phone] 2 2 2 2   [facility] [address] [place] [phone] 5 2 4 5   [facility]  [address], [place] [phone] 4 3 5 5   [facility] [address], [facility] [phone] 4 2 2 2   [facility] [address], [place] [phone] 5 1 2 2   [facility] [address], [place] [phone] 2 1 3 2   [facility]  ([facility]) [address], [place] [phone] 2 3 3 3   [facility] [address] [place], [place] [phone] 3 3 2 2   [facility]) [address], [place] [phone] 3 1 1 1   [facility] [address] [place] [phone] 2 3 4 4   [facility] [address], [place] [phone] 4 4 3 3   [address] ([place]) [address], [place] [phone] 1 2 4 3           [facility] [address] [place] [phone] 2 1 4 3   [facility] [address], [place] [phone] 4 2 1 1   [facility]  [address], [place], [place] [address] [phone] 2 2 2 2   [facility] [address] [phone] 3 2 4 4   [facility] [address] [place] [phone] 2 1 2 1   [facility] (No UHC) [address], [place] [phone] 5 4 5 5   [facility] [address], [facility] [phone] 3 4 2 2   [facility] [address] [place] [phone] 2 1 1 1   [place] [address] [place] [phone] 4 2 5 5   [facility] [address] [place] [phone] 4 1 1 1   [facility] [address] [place], [place] [phone] 2 2 3 3           [facility]  [phone] 3 1 1 1   [facility] [address], [place]lyn Clines  [phone] 3 3 3 3   [facility] (No Humana) [address], [place] [phone] 2 2 4 4   [facility]) [address], [place] [phone] 2 1 1 1   [facility] [address] [place] [address] [place] [phone] 4 3 5 5   [facility] [address], [place] [phone] 1 1 1 1           [facility] [address] [place], [place] [phone] 5 4 5 5   [facility])  [address], [place] [phone] 1 1 2 1   [facility]) [address], [place] [phone]  2 4 4   [facility] [address] [place]  [phone] 3 5 5 5   [address] [place], [place] [phone] 4 2 2 2   [facility]) [address] [place] [phone] 2 1 3 2      Expected Discharge Plan: Skilled Nursing Facility Barriers to Discharge: Continued Medical Work up, English as a second language teacher, SNF Pending bed offer   Patient Goals and CMS Choice Patient states their goals for this hospitalization and ongoing recovery are:: Rehab CMS Medicare.gov Compare Post Acute Care list provided to:: Patient Represenative (must comment) Choice offered to / list presented to : Spouse [place] ownership interest in [facility].provided to:: Spouse    Expected Discharge Plan and Services In-house Referral: Clinical Social Work     Living arrangements for the past 2 months: Single Family Home                                      Prior Living Arrangements/Services Living arrangements for the past 2 months: Single Family Home Lives with:: Spouse Patient language and need for interpreter reviewed:: Yes Do you feel safe going back to the place where you live?: Yes      Need for Family Participation in Patient Care: Yes (Comment) Care giver support system in place?: Yes (comment) Current home services: DME, Home PT Criminal Activity/Legal Involvement Pertinent to Current Situation/Hospitalization: No - Comment as needed  Activities of Daily Living   ADL Screening (condition at time of admission) Independently performs ADLs?: No Does the patient have a NEW difficulty with bathing/dressing/toileting/self-feeding that is expected to last >3 days?: No (total care with bathing and dressing) Does the patient have a NEW difficulty with getting in/out of bed, walking, or climbing stairs that is expected to last >3 days?: No (total care patient bedbound; 2 assist to chair) Does the patient have a NEW difficulty with communication that is expected to last >3 days?: No Is the  patient deaf or have difficulty hearing?: Yes Does the patient have difficulty seeing, even when wearing glasses/contacts?: Yes (reading glasses) Does the patient have difficulty concentrating, remembering, or making decisions?: Yes (forgetful at times)  Permission Sought/Granted Permission sought to share information with : Facility Medical sales representative, Family Supports Permission granted to share information with : Yes, Verbal Permission Granted  Share Information with NAME: Leia Alf  Permission granted to share info w AGENCY: SNFs  Permission granted to share info w Relationship: Spouse  Permission granted to share info w Contact Information: [phone]  Emotional Assessment Appearance:: Appears stated age Attitude/Demeanor/Rapport: Unable to Assess Affect (typically observed): Unable to Assess Orientation: : Oriented to Self, Oriented to Place, Oriented to  Time, Oriented to Situation Alcohol / Substance Use: Not Applicable Psych Involvement: No (comment)  Admission diagnosis:  GBS (Guillain Barre syndrome) (HCC) [G61.0] Weakness [R53.1] Patient Active Problem List   Diagnosis Date Noted   GBS (Guillain Barre syndrome) (HCC) 08/01/2023   COPD (chronic obstructive pulmonary disease) (HCC) 08/01/2023   Essential hypertension 08/01/2023   PCP:  Emilio Aspen, MD Pharmacy:   [facility] [phone] [place] Otto, [place] - [address] AT [facility] [address] & [address] [place] [address] Phone: [phone] Fax: [phone]  [id number] [place], [place] - [address] AT [facility] [address] [place] [address] Phone: [phone] Fax: [phone]     Social Drivers of Health (SDOH) Social History: SDOH Screenings   Food Insecurity: No Food Insecurity (08/01/2023)  Housing: Low Risk  (08/01/2023)  Transportation Needs: No Transportation Needs (08/01/2023)  Utilities: Not At Risk  (08/01/2023)  Social Connections: Moderately Isolated (08/01/2023)  Tobacco Use: Low Risk  (08/01/2023)   SDOH Interventions:     Readmission Risk Interventions     No data to display

## 2023-08-02 NOTE — Progress Notes (Addendum)
 Initial Nutrition Assessment  DOCUMENTATION CODES:   Severe malnutrition in context of chronic illness  INTERVENTION:  Discontinue Ensure Enlive po BID, each supplement provides 350 kcal and 20 grams of protein.  Initiate Boost Plus po TID, each supplement provides 360 kcal and 14g protein Vanilla Magic cup TID with meals, each supplement provides 290 kcal and 9 grams of protein  Add cola and grape juice to meal trays, to increase calorie intake Add MVI w/ minerals   NUTRITION DIAGNOSIS:  Severe Malnutrition related to chronic illness (COPD and HTN exacerbated by acute event requiring hospitalization) as evidenced by severe fat depletion, severe muscle depletion, percent weight loss.  GOAL:  Patient will meet greater than or equal to 90% of their needs  MONITOR:  PO intake, Supplement acceptance  REASON FOR ASSESSMENT:  Consult Assessment of nutrition requirement/status  ASSESSMENT:   Pt with PMH: COPD, HLD, HTN. Admitted for dehydration and anorexia. Reports progressively worse LE weakness since December.  04/2023 viral infection w/ progressive weakness 2/21 ED visit d/t fall at home 3/31 presented to [facility] at recommendation of patient's neurologist, Dr. Loleta Chance 3/28 MRI C- and L-spine: cervical spondylosis/DDD and ventra nerve root enhancment and cauda equina 4/1 admitted 4/1 MRI T-spine: nerve roots in cauda equina; abdominal xray: large stool burden 4/2 lumbar puncture  Suspected demyelinating syndrome like Guillain-Barre. LP attempted and unsuccessful in ED. Will be completed via fluoroscopy today.   Average Meal Intake No documentation to review  Spoke with patient and his wife at bedside today. She endorses he is a picky eater and intake has decreased in the last 6-12 months. She verbalizes the importance of protein and prioritizing nutrition intake, regardless of source over last year. Reiterated this and encouraged continued trend. He like Coco Cola and vanilla ice  cream. Prefers chocolate Boost. Will add all of this to patient meal plan. Recommend MVI supplementation as well.   24 Hour Recall B: skips OR corned beef hash OR Boost chocolate L: salad w/ tuna or salmon OR tuna salad OR sandwich D: soup w/ chicken or beef roast w/ potatoes and vegetables Snacks: vanilla ice cream Supplements: 3 Boosts per day  Wife reports he was independent w/ ADLs prior to Christmas. With progressive weakness, he had begun to use a walker. She states he has no difficulty chewing or swallowing at baseline. No recent N/V/C/D.   Admit/Current Weight: 63.3kg  Limited weight hx to review. Wife reports patient UBW as around 160lbs with current weight around 140lbs. Reports he has lost this much in the last three months. This is considered clinically significant (13%) for the time frame. No edema on exam.   Intake/Output Summary (Last 24 hours) at 08/02/2023 1231 Last data filed at 08/02/2023 1100 Gross per 24 hour  Intake 1933.53 ml  Output 400 ml  Net 1533.53 ml     Meds: reviewed and unremarkable  Labs:   Na+ 140 (wdl) K+ 3.8(wdl) PHOS 3.4 (wdl) CBGs 95-104 x24 hours  NUTRITION - FOCUSED PHYSICAL EXAM:  Flowsheet Row Most Recent Value  Orbital Region Moderate depletion  Upper Arm Region Severe depletion  Thoracic and Lumbar Region Severe depletion  Buccal Region Moderate depletion  Temple Region Moderate depletion  Clavicle Bone Region Severe depletion  Clavicle and Acromion Bone Region Severe depletion  Scapular Bone Region Severe depletion  Patellar Region Moderate depletion  Anterior Thigh Region Mild depletion  Posterior Calf Region Moderate depletion  Edema (RD Assessment) None  Hair Reviewed  Eyes Reviewed  Mouth Reviewed  Skin Reviewed  Nails Reviewed    Diet Order:   Diet Order             DIET DYS 3 Room service appropriate? Yes; Fluid consistency: Thin  Diet effective now            EDUCATION NEEDS:   Education needs have been  addressed  Skin:  Skin Assessment: Reviewed RN Assessment  Last BM:  4/1 - PTA  Height:  Ht Readings from Last 1 Encounters:  08/02/23 5\' 10"  (1.778 m)   Weight:  Wt Readings from Last 1 Encounters:  08/02/23 63.3 kg   BMI:  Body mass index is 20.02 kg/m.  Estimated Nutritional Needs:   Kcal:  1800-2000kcal  Protein:  85-100g  Fluid:  >1.8L/day  Myrtie Cruise MS, RD, LDN Registered Dietitian Clinical Nutrition RD Inpatient Contact Info in Amion

## 2023-08-02 NOTE — Evaluation (Signed)
 Occupational Therapy Evaluation Patient Details Name: Peter Stone MRN: [id number] DOB: 1940-07-18 Today's Date: 08/02/2023   History of Present Illness   Patient is an 83 y.o. male presenting to the ED with progressive weakness on 08/01/23. MRI of the lumbar spine showing enhancement of nerve roots concerning for inflammatory versus infectious versus low suspicion neoplastic process. Lumbar puncture unsuccessful and currently being worked up for GBS. PMH includes: COPD, HTN, HLD, HOH, OSA     Clinical Impressions Prior to this admission, patient living with his spouse, and had a caregiver that came and assisted with tasks. Patient unable to state how long or how often the caregiver comes and assists. The patient sleeps in a hospital bed at baseline, needs assist to transfer to a wheelchair, and needs assist for all ADLs. Currently, patient with progressing weakness, pain in BLEs, anxiety, and total A for all aspects of care. Typically, patient is able to self feed and complete upper body ADLs, however patient with minimal to no grip strength therefore is unable to participate. Given current level, OT recommending air mattress for pressure relief, a soft touch call system and medaboots due to external rotation at bilateral legs and hips. Patient would also benefit from lesser intensive therapy < 3 hours a day prior to discharge home. OT will continue to follow acutely.      If plan is discharge home, recommend the following:   Two people to help with walking and/or transfers;Two people to help with bathing/dressing/bathroom;Assistance with cooking/housework;Assistance with feeding;Direct supervision/assist for medications management;Direct supervision/assist for financial management;Assist for transportation;Help with stairs or ramp for entrance;Supervision due to cognitive status     Functional Status Assessment   Patient has had a recent decline in their functional status and demonstrates  the ability to make significant improvements in function in a reasonable and predictable amount of time.     Equipment Recommendations   Other (comment) (defer to next venue)     Recommendations for Other Services         Precautions/Restrictions   Precautions Precautions: Fall Restrictions Weight Bearing Restrictions Per Provider Order: No     Mobility Bed Mobility Overal bed mobility: Needs Assistance Bed Mobility: Supine to Sit, Sit to Supine     Supine to sit: Total assist, +2 for physical assistance, +2 for safety/equipment, HOB elevated Sit to supine: Total assist, +2 for physical assistance, +2 for safety/equipment, HOB elevated   General bed mobility comments: Pt c/o pain in BLE with any slight movement. He attempted to reach for bedrail with LUE but was unable to secure grip without physical assistance and lacked ability to use railing to sit up. Utilized pivot technique with OT managing BLE and PT managing trunk to achieve seated EOB with feet flat. Pt reported no pain relief once upright and stated he was unable to tolerate the position. Returned to supine using the same technqiue. Repositioned pt in bed using bed features and bed pad +2 assist.    Transfers                   General transfer comment: Deferred secondary to pain      Balance Overall balance assessment: Needs assistance Sitting-balance support: Bilateral upper extremity supported, Feet supported Sitting balance-Leahy Scale: Zero Sitting balance - Comments: Pt was dependent on trunk support by PT while seated EOB. He maintained a posterior lean and required maxA to stay upright. Pt sat upright for ~64mins before it was untolerable and he needed to return to  supine. Postural control: Posterior lean                                 ADL either performed or assessed with clinical judgement   ADL Overall ADL's : Needs assistance/impaired Eating/Feeding: Total assistance;Bed  level   Grooming: Total assistance;Bed level   Upper Body Bathing: Total assistance;Bed level   Lower Body Bathing: Total assistance;Bed level   Upper Body Dressing : Total assistance;Bed level   Lower Body Dressing: Total assistance;Bed level   Toilet Transfer: Total assistance;+2 for physical assistance;+2 for safety/equipment Toilet Transfer Details (indicate cue type and reason): bed level Toileting- Clothing Manipulation and Hygiene: Total assistance;+2 for physical assistance;+2 for safety/equipment;Bed level       Functional mobility during ADLs: Total assistance;+2 for physical assistance;+2 for safety/equipment;Cueing for safety;Cueing for sequencing General ADL Comments: Prior to this admission, patient living with his spouse, and had a caregiver that came and assisted with tasks. Patient unable to state how long or how often the caregiver comes and assists. The patient sleeps in a hospital bed at baseline, needs assist to transfer to a wheelchair, and needs assist for all ADLs. Currently, patient with progressing weakness, pain in BLEs, anxiety, and total A for all aspects of care. Typically, patient is able to self feed and complete upper body ADLs, however patient with minimal to no grip strength therefore is unable to participate. Given current level, OT recommending air mattress for pressure relief, a soft touch call system and medaboots due to external rotation at bilateral legs and hips. Patient would also benefit from lesser intensive therapy < 3 hours a day prior to discharge home. OT will continue to follow acutely.     Vision Baseline Vision/History: 0 No visual deficits Ability to See in Adequate Light: 0 Adequate Patient Visual Report: No change from baseline Vision Assessment?: No apparent visual deficits     Perception Perception: Not tested       Praxis Praxis: Not tested       Pertinent Vitals/Pain Pain Assessment Pain Assessment: Faces Faces Pain  Scale: Hurts even more Pain Location: BLE specifically medial groin/thigh with any movement Pain Descriptors / Indicators: Grimacing, Guarding, Discomfort, Sore, Tender Pain Intervention(s): Limited activity within patient's tolerance, Monitored during session, Repositioned     Extremity/Trunk Assessment Upper Extremity Assessment Upper Extremity Assessment: Right hand dominant;Generalized weakness;RUE deficits/detail;LUE deficits/detail RUE Deficits / Details: significant weakness noted, patient able to produce less than 20 degrees shoulder flexion, elbow flexion at 90 degrees, minimal to no grip strength, sensation is intact RUE Sensation: WNL RUE Coordination: decreased fine motor;decreased gross motor LUE Deficits / Details: significant weakness noted, patient able to produce less than 10 degrees shoulder flexion, elbow flexion at 90 degrees, minimal to no grip strength, sensation is intact LUE Sensation: WNL LUE Coordination: decreased fine motor;decreased gross motor   Lower Extremity Assessment Lower Extremity Assessment: Defer to PT evaluation   Cervical / Trunk Assessment Cervical / Trunk Assessment: Kyphotic   Communication Communication Communication: Impaired Factors Affecting Communication: Hearing impaired (Pt's wife took his hearing aids home)   Cognition Arousal: Alert Behavior During Therapy: Anxious Cognition: Difficult to assess, Cognition impaired Difficult to assess due to: Hard of hearing/deaf Orientation impairments: Time Awareness: Online awareness impaired, Intellectual awareness intact Memory impairment (select all impairments): Short-term memory Attention impairment (select first level of impairment): Sustained attention Executive functioning impairment (select all impairments): Sequencing, Problem solving OT - Cognition  Comments: Motor planning significantly impaired, however patient anxious and with progressive weakness, patient also hard of hearing and  did not have hearing aides, OT will continue to assess                 Following commands: Impaired Following commands impaired: Only follows one step commands consistently, Follows one step commands with increased time     Cueing  General Comments   Cueing Techniques: Verbal cues;Gestural cues;Visual cues  VSS on RA, HR up to 123 with movement but non-sustaining   Exercises     Shoulder Instructions      Home Living Family/patient expects to be discharged to:: Private residence Living Arrangements: Spouse/significant other Available Help at Discharge: Family;Available 24 hours/day;Available PRN/intermittently (Pt reports his wife isn't able to physically assist him much. Pt states he has an individual named Marilu Favre who comes and assists prn. Marilu Favre is able to physically assist with all tasks and is willing to stay as long as required per pt.) Type of Home: House Home Access: Ramped entrance     Home Layout: Two level;Able to live on main level with bedroom/bathroom;Laundry or work area in basement (Pt reports he doesn't go to the basement.)     Bathroom Shower/Tub: Chief Strategy Officer: Standard     Home Equipment: Hospital bed;Wheelchair - Therapist, sports          Prior Functioning/Environment Prior Level of Function : Needs assist;History of Falls (last six months)       Physical Assist : ADLs (physical);Mobility (physical) Mobility (physical): Transfers;Gait;Stairs ADLs (physical): Dressing;Bathing;IADLs Mobility Comments: Pt reports he sleeps in a hospital bed at home. He hasn't ambulated since his fall in Feb 2025. He reports clarence will assist him with transferring in/out of w/c. ADLs Comments: Pt hasn't gotten in the shower in a while, relying on sponge baths. Pt reports he is able to feed himself, brush his teeth, and dress his upper body. Relies on assistance for lower body dressing, toileting, and IADLs. Pt no longer drives,  relies on family for transportation.    OT Problem List: Decreased strength;Decreased range of motion;Decreased activity tolerance;Impaired balance (sitting and/or standing);Decreased coordination;Decreased cognition;Decreased safety awareness;Decreased knowledge of use of DME or AE;Cardiopulmonary status limiting activity;Impaired UE functional use;Pain   OT Treatment/Interventions: Self-care/ADL training;Therapeutic exercise;Energy conservation;DME and/or AE instruction;Manual therapy;Therapeutic activities;Cognitive remediation/compensation;Patient/family education;Balance training      OT Goals(Current goals can be found in the care plan section)   Acute Rehab OT Goals Patient Stated Goal: to be in less pain OT Goal Formulation: With patient Time For Goal Achievement: 08/16/23 Potential to Achieve Goals: Fair ADL Goals Pt Will Perform Eating: with adaptive utensils;with min assist Pt Will Perform Grooming: with adaptive equipment;with min assist Pt Will Perform Lower Body Bathing: with mod assist;sitting/lateral leans;with adaptive equipment Pt Will Perform Lower Body Dressing: with mod assist;sitting/lateral leans;with adaptive equipment Pt Will Transfer to Toilet: with mod assist;with +2 assist;stand pivot transfer;bedside commode Pt Will Perform Toileting - Clothing Manipulation and hygiene: with mod assist;sitting/lateral leans;sit to/from stand Additional ADL Goal #1: Patient will be able to tolerate sitting EOB with up to min A for support and balance for 5 minutes as a precursor to OOB activity. Additional ADL Goal #2: Patient and caregiver will be knowledgeable in pressure relief and positional changes to prevent sores and decrease stiffness.   OT Frequency:  Min 2X/week    Co-evaluation   Reason for Co-Treatment: Complexity of the patient's impairments (multi-system involvement);For patient/therapist safety;To  address functional/ADL transfers PT goals addressed during  session: Mobility/safety with mobility;Balance OT goals addressed during session: ADL's and self-care;Strengthening/ROM      AM-PAC OT "6 Clicks" Daily Activity     Outcome Measure Help from another person eating meals?: Total Help from another person taking care of personal grooming?: Total Help from another person toileting, which includes using toliet, bedpan, or urinal?: Total Help from another person bathing (including washing, rinsing, drying)?: Total Help from another person to put on and taking off regular upper body clothing?: Total Help from another person to put on and taking off regular lower body clothing?: Total 6 Click Score: 6   End of Session Nurse Communication: Mobility status;Other (comment) (air mattress, medaboots, soft touch)  Activity Tolerance: Patient limited by fatigue;Patient limited by lethargy;Patient limited by pain Patient left: in bed;with call bell/phone within reach;with chair alarm set  OT Visit Diagnosis: Unsteadiness on feet (R26.81);Other abnormalities of gait and mobility (R26.89);Repeated falls (R29.6);Muscle weakness (generalized) (M62.81);History of falling (Z91.81);Feeding difficulties (R63.3);Other symptoms and signs involving cognitive function;Adult, failure to thrive (R62.7);Pain                Time: 4098-1191 OT Time Calculation (min): 26 min Charges:  OT General Charges $OT Visit: 1 Visit OT Evaluation $OT Eval Moderate Complexity: 1 Mod  Pollyann Glen E. [place] Hagger, OTR/L Acute Rehabilitation Services [phone]   Cherlyn Cushing 08/02/2023, 2:44 PM

## 2023-08-02 NOTE — Evaluation (Signed)
 Clinical/Bedside Swallow Evaluation Patient Details  Name: Peter Stone MRN: [id number] Date of Birth: 12/07/1940  Today's Date: 08/02/2023 Time: SLP Start Time (ACUTE ONLY): 1122 SLP Stop Time (ACUTE ONLY): 1140 SLP Time Calculation (min) (ACUTE ONLY): 18 min  Past Medical History:  Past Medical History:  Diagnosis Date   Hearing impaired person, bilateral    wears hearing aids   Hypertension    Past Surgical History:  Past Surgical History:  Procedure Laterality Date   APPENDECTOMY     laparoscopic   CATARACT EXTRACTION, BILATERAL     COLONOSCOPY WITH PROPOFOL N/A 03/07/2016   Procedure: COLONOSCOPY WITH PROPOFOL;  Surgeon: Charolett Bumpers, MD;  Location: [facility];  Service: Endoscopy;  Laterality: N/A;   VASECTOMY     HPI:  Peter Stone is an 83 yo male presenting to ED 4/1 with progressive LE weakness. Admitted due to concern for demyelinating disease, undergoing w/u. MRI Thoracic Spine shows contrast enhancement of visualized ventral nerve roots of the cauda equina, which can be seen in the setting of Guillain-Barre syndrome. LP pending. PMH includes COPD, HTN, HLD, hard of hearing    Assessment / Plan / Recommendation  Clinical Impression  Pt reports general distaste for food but no perceived difficulty swallowing. Observed with trials of thin liquids and solids without overt s/s of dysphagia or aspiration. Discussed potential for increased difficulty swallowing in the case that weakness continues to progress. Pt has difficulty lifting upper extremities to feed himself, but demonstrates ability to fully masticate regular solids if given assistance with feeding. Recommend upgrading to regular diet and continuing thin liquids with full assistance for feeding. SLP will f/u at least briefly to reinforce education. SLP Visit Diagnosis: Dysphagia, unspecified (R13.10)    Aspiration Risk  Mild aspiration risk    Diet Recommendation Regular;Thin liquid    Liquid  Administration via: Cup;Straw Medication Administration: Whole meds with liquid Supervision: Staff to assist with self feeding Compensations: Minimize environmental distractions;Slow rate;Small sips/bites Postural Changes: Seated upright at 90 degrees;Remain upright for at least 30 minutes after po intake    Other  Recommendations Oral Care Recommendations: Oral care BID    Recommendations for follow up therapy are one component of a multi-disciplinary discharge planning process, led by the attending physician.  Recommendations may be updated based on patient status, additional functional criteria and insurance authorization.  Follow up Recommendations Skilled nursing-short term rehab (<3 hours/day)      Assistance Recommended at Discharge    Functional Status Assessment Patient has had a recent decline in their functional status and demonstrates the ability to make significant improvements in function in a reasonable and predictable amount of time.  Frequency and Duration min 2x/week  2 weeks       Prognosis Prognosis for improved oropharyngeal function: Good      Swallow Study   General HPI: Peter Stone is an 83 yo male presenting to ED 4/1 with progressive LE weakness. Admitted due to concern for demyelinating disease, undergoing w/u. MRI Thoracic Spine shows contrast enhancement of visualized ventral nerve roots of the cauda equina, which can be seen in the setting of Guillain-Barre syndrome. LP pending. PMH includes COPD, HTN, HLD, hard of hearing Type of Study: Bedside Swallow Evaluation Previous Swallow Assessment: none in chart Diet Prior to this Study: Dysphagia 3 (mechanical soft);Thin liquids (Level 0) Temperature Spikes Noted: No Respiratory Status: Room air History of Recent Intubation: No Behavior/Cognition: Alert;Cooperative;Pleasant mood Oral Cavity Assessment: Within Functional Limits Oral Care Completed by SLP: No  Oral Cavity - Dentition: Adequate natural  dentition Vision: Functional for self-feeding Self-Feeding Abilities: Able to feed self Patient Positioning: Upright in bed Baseline Vocal Quality: Normal Volitional Cough: Strong Volitional Swallow: Able to elicit    Oral/Motor/Sensory Function Overall Oral Motor/Sensory Function: Generalized oral weakness   Ice Chips Ice chips: Not tested   Thin Liquid Thin Liquid: Within functional limits Presentation: Straw    Nectar Thick Nectar Thick Liquid: Not tested   Honey Thick Honey Thick Liquid: Not tested   Puree Puree: Not tested   Solid     Solid: Within functional limits      Gwynneth Aliment, M.A., CF-SLP Speech Language Pathology, Acute Rehabilitation Services  Secure Chat preferred [phone]  08/02/2023,12:27 PM

## 2023-08-02 NOTE — Procedures (Signed)
 PROCEDURE SUMMARY:  Successful fluoroscopic guided lumbar puncture at the level of L4-5.  Opening pressure was 20 cm H2O ~ 10 mL clear colorless fluid collected and sent for labs.  No immediate complications.  Pt tolerated well.   EBL = none  Please see full dictation in imaging section of Epic for procedure details.   Electronically Signed: Jama Flavors, PA-C 08/02/2023, 1:04 PM

## 2023-08-02 NOTE — Progress Notes (Signed)
 Neurology Progress Note   S:// No acute events overnight. No new or worsening symptoms. Patient reports ongoing weakness LE>UE along with some slurrred speech. Pending LP today.    O:// Current vital signs: BP (!) 148/79 (BP Location: Left Arm)   Pulse 99   Temp 97.7 F (36.5 C) (Oral)   Resp 17   Ht 5\' 10"  (1.778 m)   Wt 63.3 kg   SpO2 98%   BMI 20.02 kg/m  Vital signs in last 24 hours: Temp:  [97.7 F (36.5 C)-98.8 F (37.1 C)] 97.7 F (36.5 C) (04/02 0817) Pulse Rate:  [65-106] 99 (04/02 0900) Resp:  [13-19] 17 (04/02 0900) BP: (132-174)/(62-106) 148/79 (04/02 0800) SpO2:  [94 %-100 %] 98 % (04/02 0900) Weight:  [63.3 kg] 63.3 kg (04/02 0400) GENERAL: Awake, Thin body habitus, in no acute distress. HEENT: - Normocephalic and atraumatic, dry mm, no LN++, no Thyromegally LUNGS - Clear to auscultation bilaterally with no wheezes CV - equal pulses bilaterally. ABDOMEN - Soft, nontender, nondistended with normoactive BS Ext: warm, well perfused, intact peripheral pulses  NEURO:  Mental Status: AA&Ox3  Language: speech is moderately dysarthric.  Naming, repetition, fluency, and comprehension intact. Cranial Nerves: PERRL 51mm/brisk. EOMI, visual fields full, no facial asymmetry, facial sensation intact, hearing intact, tongue/uvula/soft palate midline, weak tongue protrusion against resistance, 4/5 sternocleidomastoid and trapezius muscle strength. No evidence of tongue atrophy or fibrillations  Motor: 3/5 in b/l UE, intact range of motion without resistance  Tone: is diminished thrpoughout, b/l LE >UE, and significant muscle mass loss throughout Sensation- Intact to light touch and vibration throughout, equal in b/l UE and LE.  Coordination: Difficult to assess due to weakness.  Gait- deferred  Medications  Current Facility-Administered Medications:    acetaminophen (TYLENOL) tablet 650 mg, 650 mg, Oral, Q6H PRN **OR** acetaminophen (TYLENOL) suppository 650 mg, 650 mg,  Rectal, Q6H PRN, Doutova, Anastassia, MD   albuterol (PROVENTIL) (2.5 MG/3ML) 0.083% nebulizer solution 2.5 mg, 2.5 mg, Nebulization, Q2H PRN, Doutova, Anastassia, MD   feeding supplement (ENSURE ENLIVE / ENSURE PLUS) liquid 237 mL, 237 mL, Oral, BID BM, Ghimire, Werner Lean, MD   HYDROcodone-acetaminophen (NORCO/VICODIN) 5-325 MG per tablet 1-2 tablet, 1-2 tablet, Oral, Q4H PRN, Doutova, Anastassia, MD   lidocaine (XYLOCAINE) 2 % (with pres) injection 200 mg, 10 mL, Intradermal, Once, Doutova, Anastassia, MD   ondansetron (ZOFRAN) tablet 4 mg, 4 mg, Oral, Q6H PRN **OR** ondansetron (ZOFRAN) injection 4 mg, 4 mg, Intravenous, Q6H PRN, Doutova, Anastassia, MD  Labs CBC    Component Value Date/Time   WBC 8.0 08/02/2023 0455   RBC 4.23 08/02/2023 0455   HGB 13.4 08/02/2023 0455   HCT 40.1 08/02/2023 0455   PLT 247 08/02/2023 0455   MCV 94.8 08/02/2023 0455   MCH 31.7 08/02/2023 0455   MCHC 33.4 08/02/2023 0455   RDW 15.6 (H) 08/02/2023 0455   LYMPHSABS 1.2 08/01/2023 1350   MONOABS 1.1 (H) 08/01/2023 1350   EOSABS 0.6 (H) 08/01/2023 1350   BASOSABS 0.1 08/01/2023 1350    CMP     Component Value Date/Time   NA 140 08/02/2023 0455   K 3.8 08/02/2023 0455   CL 110 08/02/2023 0455   CO2 22 08/02/2023 0455   GLUCOSE 95 08/02/2023 0455   BUN 36 (H) 08/02/2023 0455   CREATININE 0.60 (L) 08/02/2023 0455   CALCIUM 8.9 08/02/2023 0455   PROT 4.5 (L) 08/02/2023 0455   ALBUMIN 2.6 (L) 08/02/2023 0455   AST 33 08/02/2023 0455  ALT 41 08/02/2023 0455   ALKPHOS 98 08/02/2023 0455   BILITOT 1.0 08/02/2023 0455   GFRNONAA >60 08/02/2023 0455   GFRAA  07/10/2008 1720    >60        The eGFR has been calculated using the MDRD equation. This calculation has not been validated in all clinical situations. eGFR's persistently <60 mL/min signify possible Chronic Kidney Disease.    Lipid Panel  No results found for: "CHOL", "TRIG", "HDL", "CHOLHDL", "VLDL", "LDLCALC", "LDLDIRECT"  No  results found for: "HGBA1C"   Imaging I have reviewed images in epic and the results pertinent to this consultation are:  MRI of the brain: no acute findings, chronic small vessel ischemia.   MRI thoracic spine: contrast enhancement of the visualized ventral nerve roots of the cauda equina. As previously described this can be seen in the setting of Guillain-Barre syndrome. Small central disc protrusion at T7-8 without spinal canal stenosis.  MRI of the lumbar spine done outpatient 07/28/2023-substantial abnormal ventral nerve root enhancement in the cauda equina with some low-level leptomeningeal enhancement along the anterior margin of the conus.  Guillain-Barr syndrome is most likely because of this pattern.  Pattern cannot really be seen in other settings including spinal cord infarct, tickborne encephalopathy.  Leptomeningeal metastatic disease seems less likely given how the dorsal nerve roots are spared.  Lumbar spondylosis and degenerative disc disease causing mild impingement at L1-2, L2-3 and L3-4 levels.  Mild scoliosis.   Neurodiagnostics EMG/NCS Impression: This is an abnormal study. The findings are most consistent with the following: There is active/ongoing denervation with reinnervation in all muscles evaluated in the lower extremity with mostly chronic changes seen in the upper extremity. This could be representative of a wide spread, non-length dependent neuropathy given absent lower extremity sensory responses. Alternatively, absent lower extremity sensory responses may be due to age, which would make changes more consistent with polyradiculopathy or an evolving disorder of anterior horn cells (ie: motor neuron disease) among other possibilities. There is no definitive evidence of a demyelinating neuropathy. No electrodiagnostic evidence of myopathy.  Assessment:   Peter Stone is a 83 y.o. male with progressive generalized weakness and on examination proximal lower  extremity weakness worse than distal lower extremity weakness and some weakness also in the upper extremities along with what sounds like some bulbar involvement now.  MRI of the lumbar spine concerning for ventral nerve root enhancement-in the pattern seen with Guillain-Barr syndrome but there is also some leptomeningeal enhancement along the anterior margin of the conus making the differential broad to infectious versus inflammatory versus malignant/metastatic. LP attempted in the ED-unsuccessful.  Awaiting fluoroscopy guided LP. MRI of the brain and thoracic spine also obtained which are unrevealing.  Electrographic studies show active/ongoing denervation with reinnervation in all muscles evaluated in the lower extremity with mostly chronic changes seen in the upper extremity.  This could be representative of widespread nonlength-dependent neuropathy given absent lower extremity sensory responses-alternatively the lower extremity sensory responses may be absent due to age which would make changes more consistent with polyradiculopathy or evolving disorder of anterior horn cells i.e. motor neuron disease among other possibilities.   Broad differentials at this time for his progressive weakness that includes polyradiculoneuropathy such as Guillain-Barr syndrome/CIDP versus motor neuron disease.  Workup being directed by the outpatient neuromuscular specialist-appreciate guidance and communication.   RECOMMENDATIONS   Lumbar puncture for cell count, protein, glucose, cytology, meningitis/encephalitis panel, CSF VZV IgG, IgG index, oligoclonal band. Consider testing for Lyme disease as  well as checking zinc, copper, B12, MMA, folate, vitamin D, thiamine, vitamin B 3 and vitamin B6. Follow-up on remaining studies of the following: - ESR, CRP, HIV, RPR, QuantiFERON gold TB testing, hepatitis panel. Added muscle specific kinase for completion of the myasthenic panel With a guttural voice and bulbar  symptoms - added anti-GQ 1B antibodies Autoimmune neuropathy panel Therapy assessments Correction of metabolic derangements and nutritional management per primary team Once all the above testing is available-we will provide further recommendations in conjunction with discussion with the neuromuscular specialist.   Anibal Henderson, MD [facility]

## 2023-08-02 NOTE — Progress Notes (Signed)
 Pt extremely restless and agitated, unable to be redirected. There is concern for his safety and a dose of Haldol will be given.

## 2023-08-02 NOTE — Progress Notes (Addendum)
 PROGRESS NOTE        PATIENT DETAILS Name: Peter Stone Age: 83 y.o. Sex: male Date of Birth: 08-23-1940 Admit Date: 08/01/2023 Admitting Physician Therisa Doyne, MD ZOX:[id number], Sherie Don, MD  Brief Summary: Patient is a 83 y.o.  male with history of COPD, HTN, HLD, hard of hearing-who presented with progressive lower extremity weakness-(ongoing since Christmas-started using a walker)-but progressively worse over the past several weeks to the point that he has stopped ambulating.  He was evaluated by neurology-with concern for demyelinating syndrome like Guillain-Barr-and subsequently admitted to the hospitalist service.  See below for further details.  Significant events: 4/1>> admit to [facility]-progressive lower extremity weakness  Significant studies: 3/28>> MRI C-spine: Cervical spondylosis/degenerative disc disease. 3/28>> MRI L-spine: Ventral nerve root enhancement and cauda equina. 4/01>> MRI T-spine: Contrast enhancement of visualized nerve roots in the cauda equina. 4/01>> abdominal x-ray: Large stool burden.  Significant microbiology data: 4/01>> COVID/influenza/RSV PCR: Negative  Procedures: None  Consults: Neurology  Subjective: Lying comfortably in bed-denies any chest pain or shortness of breath.  Unchanged lower extremity weakness.  Objective: Vitals: Blood pressure (!) 148/79, pulse 99, temperature 97.7 F (36.5 C), temperature source Oral, resp. rate 17, height 5\' 10"  (1.778 m), weight 63.3 kg, SpO2 98%.   Exam: Gen Exam:Alert awake-not in any distress HEENT:atraumatic, normocephalic Chest: B/L clear to auscultation anteriorly CVS:S1S2 regular Abdomen:soft non tender, non distended Extremities:no edema Neurology: Unable to lift both lower extremities off the bed against gravity-able to move legs side-to-side.  Lower extremity appears flaccid.  Unable to get any DTRs in lower extremity. Appears to have some mild weakness  in the distal upper extremities as well. Skin: no rash  Pertinent Labs/Radiology:    Latest Ref Rng & Units 08/02/2023    4:55 AM 08/01/2023    1:50 PM 06/23/2023    2:30 PM  CBC  WBC 4.0 - 10.5 K/uL 8.0  7.2  9.1   Hemoglobin 13.0 - 17.0 g/dL 81.1  91.4  78.2   Hematocrit 39.0 - 52.0 % 40.1  44.5  41.1   Platelets 150 - 400 K/uL 247  253  202     Lab Results  Component Value Date   NA 140 08/02/2023   K 3.8 08/02/2023   CL 110 08/02/2023   CO2 22 08/02/2023      Assessment/Plan: Progressive lower extremity>> upper extremity weakness Concern for GBS/CIDP-Per neurology-motor neuron disease/infectious etiolog also in the differential Neuroimaging as above Extensive serological/autoimmune/nutrition panel- workup pending Schedule for fluoroscopy guided lumbar puncture today PT/OT Monitor NIF/FVC closely. Await further recommendations from neurology.  COPD Stable Bronchodilators  HTN Blood pressure fluctuating-but mostly on the higher side Starting low-dose amlodipine (per H&P-patient not taking HCTZ/lisinopril)  Hard of hearing  Nutrition Status: Nutrition Problem: Severe Malnutrition Etiology: chronic illness (COPD and HTN exacerbated by acute event requiring hospitalization) Signs/Symptoms: severe fat depletion, severe muscle depletion, percent weight loss Interventions: Boost Plus, Magic cup, Refer to RD note for recommendations, MVI    Code status:   Code Status: Full Code   DVT Prophylaxis: SCDs Start: 08/01/23 2147   Family Communication: None at bedside   Disposition Plan: Status is: Inpatient Remains inpatient appropriate because: Severity of illness   Planned Discharge Destination:Home   Diet: Diet Order             DIET DYS 3 Room service  appropriate? Yes; Fluid consistency: Thin  Diet effective now                     Antimicrobial agents: Anti-infectives (From admission, onward)    None        MEDICATIONS: Scheduled Meds:   feeding supplement  237 mL Oral BID BM   lidocaine  10 mL Intradermal Once   Continuous Infusions: PRN Meds:.acetaminophen **OR** acetaminophen, albuterol, HYDROcodone-acetaminophen, ondansetron **OR** ondansetron (ZOFRAN) IV   I have personally reviewed following labs and imaging studies  LABORATORY DATA: CBC: Recent Labs  Lab 08/01/23 1350 08/02/23 0455  WBC 7.2 8.0  NEUTROABS 4.4  --   HGB 14.7 13.4  HCT 44.5 40.1  MCV 96.1 94.8  PLT 253 247    Basic Metabolic Panel: Recent Labs  Lab 08/01/23 1350 08/02/23 0059 08/02/23 0455  NA 139  --  140  K 4.4  --  3.8  CL 106  --  110  CO2 22  --  22  GLUCOSE 104*  --  95  BUN 40*  --  36*  CREATININE 0.62  --  0.60*  CALCIUM 9.1  --  8.9  MG  --  2.1 2.0  PHOS  --  3.3 3.4    GFR: Estimated Creatinine Clearance: 60.4 mL/min (A) (by C-G formula based on SCr of 0.6 mg/dL (L)).  Liver Function Tests: Recent Labs  Lab 08/01/23 1350 08/02/23 0455  AST 38 33  ALT 43 41  ALKPHOS 113 98  BILITOT 1.0 1.0  PROT 5.8* 4.5*  ALBUMIN 3.0* 2.6*   No results for input(s): "LIPASE", "AMYLASE" in the last 168 hours. No results for input(s): "AMMONIA" in the last 168 hours.  Coagulation Profile: Recent Labs  Lab 08/02/23 0059  INR 1.0    Cardiac Enzymes: Recent Labs  Lab 08/02/23 0059  CKTOTAL 60    BNP (last 3 results) No results for input(s): "PROBNP" in the last 8760 hours.  Lipid Profile: No results for input(s): "CHOL", "HDL", "LDLCALC", "TRIG", "CHOLHDL", "LDLDIRECT" in the last 72 hours.  Thyroid Function Tests: Recent Labs    08/02/23 0059  TSH 1.046    Anemia Panel: Recent Labs    08/01/23 1817  VITAMINB12 612  FOLATE >40.0    Urine analysis:    Component Value Date/Time   COLORURINE YELLOW 06/23/2023 1435   APPEARANCEUR CLEAR 06/23/2023 1435   LABSPEC 1.013 06/23/2023 1435   PHURINE 5.0 06/23/2023 1435   GLUCOSEU NEGATIVE 06/23/2023 1435   HGBUR NEGATIVE 06/23/2023 1435    BILIRUBINUR NEGATIVE 06/23/2023 1435   KETONESUR 20 (A) 06/23/2023 1435   PROTEINUR NEGATIVE 06/23/2023 1435   NITRITE NEGATIVE 06/23/2023 1435   LEUKOCYTESUR NEGATIVE 06/23/2023 1435    Sepsis Labs: Lactic Acid, Venous No results found for: "LATICACIDVEN"  MICROBIOLOGY: Recent Results (from the past 240 hours)  Resp panel by RT-PCR (RSV, Flu A&B, Covid) Anterior Nasal Swab     Status: None   Collection Time: 08/01/23  1:51 PM   Specimen: Anterior Nasal Swab  Result Value Ref Range Status   SARS Coronavirus 2 by RT PCR NEGATIVE NEGATIVE Final   Influenza A by PCR NEGATIVE NEGATIVE Final   Influenza B by PCR NEGATIVE NEGATIVE Final    Comment: (NOTE) The Xpert Xpress SARS-CoV-2/FLU/RSV plus assay is intended as an aid in the diagnosis of influenza from Nasopharyngeal swab specimens and should not be used as a sole basis for treatment. Nasal washings and aspirates are unacceptable for  Xpert Xpress SARS-CoV-2/FLU/RSV testing.  Fact Sheet for Patients: BloggerCourse.com  Fact Sheet for Healthcare Providers: SeriousBroker.it  This test is not yet approved or cleared by the [place] FDA and has been authorized for detection and/or diagnosis of SARS-CoV-2 by FDA under an Emergency Use Authorization (EUA). This EUA will remain in effect (meaning this test can be used) for the duration of the COVID-19 declaration under Section 564(b)(1) of the Act, 21 U.S.C. section 360bbb-3(b)(1), unless the authorization is terminated or revoked.     Resp Syncytial Virus by PCR NEGATIVE NEGATIVE Final    Comment: (NOTE) Fact Sheet for Patients: BloggerCourse.com  Fact Sheet for Healthcare Providers: SeriousBroker.it  This test is not yet approved or cleared by the [place] FDA and has been authorized for detection and/or diagnosis of SARS-CoV-2 by FDA under an Emergency Use  Authorization (EUA). This EUA will remain in effect (meaning this test can be used) for the duration of the COVID-19 declaration under Section 564(b)(1) of the Act, 21 U.S.C. section 360bbb-3(b)(1), unless the authorization is terminated or revoked.  Performed at [facility], [address], [place], [place] [address]     RADIOLOGY STUDIES/RESULTS: MR THORACIC SPINE W WO CONTRAST Result Date: 08/01/2023 CLINICAL DATA:  Lumbar myelopathy. EXAM: MRI THORACIC WITHOUT AND WITH CONTRAST TECHNIQUE: Multiplanar and multiecho pulse sequences of the thoracic spine were obtained without and with intravenous contrast. CONTRAST:  8mL GADAVIST GADOBUTROL 1 MMOL/ML IV SOLN COMPARISON:  Lumbar spine MRI 07/29/2018 FINDINGS: Alignment:  Physiologic. Vertebrae: No fracture, evidence of discitis, or bone lesion. Cord: Normal signal and morphology. Contrast enhancement of the visualized ventral nerve roots of the cauda equina. Paraspinal and other soft tissues: Left basilar atelectasis. Disc levels: T7-8: Small central disc protrusion without spinal canal stenosis. The other disc levels are unremarkable. IMPRESSION: 1. Contrast enhancement of the visualized ventral nerve roots of the cauda equina. As previously described this can be seen in the setting of Guillain-Barre syndrome. 2. Small central disc protrusion at T7-8 without spinal canal stenosis. Electronically Signed   By: Deatra Robinson M.D.   On: 08/01/2023 23:42   MR BRAIN W WO CONTRAST Result Date: 08/01/2023 CLINICAL DATA:  Altered mental status EXAM: MRI HEAD WITHOUT AND WITH CONTRAST TECHNIQUE: Multiplanar, multiecho pulse sequences of the brain and surrounding structures were obtained without and with intravenous contrast. CONTRAST:  8mL GADAVIST GADOBUTROL 1 MMOL/ML IV SOLN COMPARISON:  04/07/2021 FINDINGS: Brain: No acute infarct, mass effect or extra-axial collection. No acute or chronic hemorrhage. There is multifocal hyperintense T2-weighted  signal within the white matter. Parenchymal volume and CSF spaces are normal. The midline structures are normal. There is no abnormal contrast enhancement. Vascular: Normal flow voids. Skull and upper cervical spine: Normal calvarium and skull base. Visualized upper cervical spine and soft tissues are normal. Sinuses/Orbits:No paranasal sinus fluid levels or advanced mucosal thickening. No mastoid or middle ear effusion. Normal orbits. IMPRESSION: 1. No acute intracranial abnormality. 2. Findings of chronic small vessel ischemia. Electronically Signed   By: Deatra Robinson M.D.   On: 08/01/2023 23:31   DG Chest Port 1 View Result Date: 08/01/2023 CLINICAL DATA:  Generalized weakness and dehydration EXAM: PORTABLE CHEST 1 VIEW COMPARISON:  Radiograph 06/23/2023 FINDINGS: The heart size and mediastinal contours are within normal limits. Both lungs are clear. The visualized skeletal structures are unremarkable. IMPRESSION: No active disease. Electronically Signed   By: Minerva Fester M.D.   On: 08/01/2023 21:19   DG Abd 1 View Result Date:  08/01/2023 CLINICAL DATA:  Generalized weakness, dehydration, left leg cramping, poor intake and frequent urine is a shin EXAM: ABDOMEN - 1 VIEW COMPARISON:  Radiograph 02/27/2015 FINDINGS: Large colonic stool burden. No evidence of bowel obstruction. No acute osseous abnormality. IMPRESSION: Large colonic stool burden.  No evidence of obstruction. Electronically Signed   By: Minerva Fester M.D.   On: 08/01/2023 21:19   NCV with EMG(electromyography) Result Date: 07/31/2023 Antony Madura, MD     08/01/2023 10:02 AM [facility] [address] [place], [address]  [place], [place] [address] Tel: [phone] Fax: [phone] Test Date:  07/31/2023 Patient: Jeziel Hoffmann DOB: 10-Sep-1940 Physician: Jacquelyne Balint, MD Sex: Male Height: 5\' 10"  Ref Phys: Jacquelyne Balint, MD ID#: [id number]   Technician:  History: This is an 83 year old male with progressive weakness. NCV & EMG  Findings: Extensive electrodiagnostic evaluation of the left upper and lower limbs with needle examination of thoracic paraspinal muscles shows: Left sural and superficial peroneal/fibular sensory responses are absent. Left median, ulnar, and radial sensory responses are within normal limits. Left peroneal/fibular (EDB), peroneal/fibular (TA), tibial (AH), median (APB), and ulnar (ADM) motor responses show reduced amplitudes (EDB 1.03 mV, TA 1.40 mV, AH 3.5 mV, APB 4.8 mV, ADM 6.6 mV). Conduction velocities are normal. Left H reflex is absent. Chronic motor axon loss changes WITH accompanying active denervation changes are seen in left tibialis anterior and pronator teres. Active denervation changes without any observable motor units are seen in left medial head of gastrocnemius, short head of biceps femoris, rectus femoris, and gluteus medius, Chronic motor axon loss changes WITHOUT definitive accompanying active denervation changes are seen in left first dorsal interosseous, extensor indicis proprius, biceps, deltoid, and thoracic paraspinal (T9 level muscles (which patient could not relax). Patient could not tolerate further paraspinal muscle testing due to discomfort. Fasciculations are seen in at least 3 of 12 tested muscles. Impression: This is an abnormal study. The findings are most consistent with the following: There is active/ongoing denervation with reinnervation in all muscles evaluated in the lower extremity with mostly chronic changes seen in the upper extremity. This could be representative of a wide spread, non-length dependent neuropathy given absent lower extremity sensory responses. Alternatively, absent lower extremity sensory responses may be due to age, which would make changes more consistent with polyradiculopathy or an evolving disorder of anterior horn cells (ie: motor neuron disease) among other possibilities. There is no definitive evidence of a demyelinating neuropathy. No  electrodiagnostic evidence of myopathy. ___________________________ Jacquelyne Balint, MD Nerve Conduction Studies Motor Nerve Results   Latency Amplitude F-Lat Segment Distance CV Comment Site (ms) Norm (mV) Norm (ms)  (cm) (m/s) Norm  Left Fibular (EDB) Motor Ankle 4.5  < 6.0 *1.03  > 2.5       Bel fib head *NR - *NR -  Bel fib head-Ankle - *NR  > 40  Pop fossa *NR - *NR -  Pop fossa-Bel fib head - *NR -  Left Fibular (TA) Motor Fib head 2.9  < 4.5 *1.40  > 3.0       Pop fossa 5.2  < 6.7 1.34 -  Pop fossa-Fib head 10 43  > 40  Left Median (APB) Motor Wrist 2.7  < 4.0 *4.8  > 5.0       Elbow 8.4 - 4.4 -  Elbow-Wrist 28.5 50  > 50  Left Tibial (AH) Motor Ankle 5.5  < 6.0 *3.5  > 4.0  Knee 15.5 - 1.52 -  Knee-Ankle 44 44  > 40  Left Ulnar (ADM) Motor Wrist 2.2  < 3.1 *6.6  > 7.0       Bel elbow 6.4 - 4.5 -  Bel elbow-Wrist 22 52  > 50  Ab elbow 8.3 - 3.8 -  Ab elbow-Bel elbow 10 53 -  Sensory Sites   Neg Peak Lat Amplitude (O-P) Segment Distance Velocity Comment Site (ms) Norm (V) Norm  (cm) (ms)  Left Median Sensory Wrist-Dig II 3.1  < 3.8 15  > 10 Wrist-Dig II 13   Left Radial Sensory Forearm-Wrist 2.5  < 2.8 13  > 10 Forearm-Wrist 10   Left Superficial Fibular Sensory 14 cm-Ankle *NR  < 4.6 *NR  > 3 14 cm-Ankle 14   Left Sural Sensory Calf-Lat mall *NR  < 4.6 *NR  > 3 Calf-Lat mall 14   Left Ulnar Sensory Wrist-Dig V 2.9  < 3.2 6  > 5 Wrist-Dig V 11   H-Reflex Results   M-Lat H Lat H Neg Amp H-M Lat Site (ms) (ms) Norm (mV) (ms) Left Tibial H-Reflex Pop fossa 7.9 -  < 35.0 - - Electromyography  Side Muscle Ins.Act Fibs Fasc Recrt Amp Dur Poly Activation Comment Left FDI Nml Nml *1+ *3- *1+ *1+ *1+ *1- N/A Left EIP Nml Nml *1+ *2- *1+ *1+ *1+ *1- N/A Left Pronator teres Nml *2+ Nml *2- *1+ *1+ *1+ *1- N/A Left Biceps Nml Nml *1+ *3- *1+ *1+ *1+ *1- N/A Left Triceps Nml Nml Nml *None *- *- *- *2- N/A Left Deltoid Nml Nml Nml *2- Nml Nml Nml *2- N/A Left Tib ant Nml *2+ Nml *3- *1+ *1+ *1+ *2- N/A Left Gastroc MH Nml  *1+ Nml *None *- *- *- *None N/A Left Rectus fem Nml *2+ Nml *None *- *- *- *None N/A Left Biceps fem SH Nml *2+ Nml *None *- *- *- *None N/A Left T9 PSP Nml Nml Nml *1- *1+ *1+ *1+ Nml Unable to relax Left Gluteus med Nml *1+ Nml *None *- *- *- *None N/A Waveforms: Motor         Sensory         H-Reflex      LOS: 1 day   Jeoffrey Massed, MD  [facility]    To contact the attending provider between 7A-7P or the covering provider during after hours 7P-7A, please log into the web site [email or web address] and access using universal [place] password for that web site. If you do not have the password, please call the hospital operator.  08/02/2023, 9:34 AM

## 2023-08-02 NOTE — Evaluation (Signed)
 Physical Therapy Evaluation Patient Details Name: Peter Stone MRN: [id number] DOB: 10/29/1940 Today's Date: 08/02/2023  History of Present Illness  Patient is an 83 y.o. male presenting to the ED with progressive weakness on 08/01/23. MRI of the lumbar spine showing enhancement of nerve roots concerning for inflammatory versus infectious versus low suspicion neoplastic process. Lumbar puncture unsuccessful and currently being worked up for GBS. PMH includes: COPD, HTN, HLD, HOH, OSA.   Clinical Impression  Pt admitted with above diagnosis. PTA, pt was relying on a w/c for mobility and required assistance to transfers. He reports sleeping in a hospital bed and completing sponge baths. Pt resides with his wife in a two story house with a ramp entrance. He is able to stay on the main level and has 24/7 supervision from his wife and prn assistance from [place]. Pt currently with functional limitations due to the deficits listed below (see PT Problem List). Pt required totalA x2 for bed mobility and maxA to maintain seated balance EOB. He demonstrated progressive weakness in all extremities and is severely limited by pain. Pt will benefit from acute skilled PT to maximize his mobility and decrease caregiver burden. Recommend continued inpatient follow up therapy, <3 hours/day.    If plan is discharge home, recommend the following: Two people to help with walking and/or transfers;Two people to help with bathing/dressing/bathroom;Assistance with cooking/housework;Assistance with feeding;Direct supervision/assist for medications management;Direct supervision/assist for financial management;Assist for transportation;Help with stairs or ramp for entrance   Can travel by private vehicle   No    Equipment Recommendations Hoyer lift  Recommendations for Other Services       Functional Status Assessment Patient has had a recent decline in their functional status and/or demonstrates limited ability to make  significant improvements in function in a reasonable and predictable amount of time     Precautions / Restrictions Precautions Precautions: Fall      Mobility  Bed Mobility Overal bed mobility: Needs Assistance Bed Mobility: Supine to Sit, Sit to Supine     Supine to sit: Total assist, +2 for physical assistance, +2 for safety/equipment, HOB elevated Sit to supine: Total assist, +2 for physical assistance, +2 for safety/equipment, HOB elevated   General bed mobility comments: Pt c/o pain in BLE with any slight movement. He attempted to reach for bedrail with LUE but was unable to secure grip without physical assistance and lacked ability to use railing to sit up. Utilized pivot technique with OT managing BLE and PT managing trunk to achieve seated EOB with feet flat. Pt reported no pain relief once upright and stated he was unable to tolerate the position. Returned to supine using the same technqiue. Repositioned pt in bed using bed features and bed pad +2 assist.    Transfers                   General transfer comment: Deferred secondary to pain    Ambulation/Gait                  Stairs            Wheelchair Mobility     Tilt Bed    Modified Rankin (Stroke Patients Only)       Balance Overall balance assessment: Needs assistance Sitting-balance support: Bilateral upper extremity supported, Feet supported Sitting balance-Leahy Scale: Zero Sitting balance - Comments: Pt was dependent on trunk support by PT while seated EOB. He maintained a posterior lean and required maxA to stay upright. Pt sat  upright for ~21mins before it was untolerable and he needed to return to supine. Postural control: Posterior lean                                   Pertinent Vitals/Pain Pain Assessment Pain Assessment: Faces Faces Pain Scale: Hurts even more Pain Location: BLE specifically medial groin/thigh with any movement Pain Descriptors /  Indicators: Grimacing, Guarding, Discomfort, Sore, Tender Pain Intervention(s): Monitored during session, Limited activity within patient's tolerance, Repositioned    Home Living Family/patient expects to be discharged to:: Private residence Living Arrangements: Spouse/significant other Available Help at Discharge: Family;Available 24 hours/day;Available PRN/intermittently (Pt reports his wife isn't able to physically assist him much. Pt states he has an individual named Marilu Favre who comes and assists prn. Marilu Favre is able to physically assist with all tasks and is willing to stay as long as required per pt.) Type of Home: House Home Access: Ramped entrance       Home Layout: Two level;Able to live on main level with bedroom/bathroom;Laundry or work area in basement (Pt reports he doesn't go to the basement.) Home Equipment: Hospital bed;Wheelchair - Therapist, sports      Prior Function Prior Level of Function : Needs assist;History of Falls (last six months)       Physical Assist : ADLs (physical);Mobility (physical) Mobility (physical): Transfers;Gait;Stairs ADLs (physical): Dressing;Bathing;IADLs Mobility Comments: Pt reports he sleeps in a hospital bed at home. He hasn't ambulated since his fall in Feb 2025. He reports clarence will assist him with transferring in/out of w/c. ADLs Comments: Pt hasn't gotten in the shower in a while, relying on sponge baths. Pt reports he is able to feed himself, brush his teeth, and dress his upper body. Relies on assistance for lower body dressing, toileting, and IADLs. Pt no longer drives, relies on family for transportation.     Extremity/Trunk Assessment   Upper Extremity Assessment Upper Extremity Assessment: Defer to OT evaluation    Lower Extremity Assessment Lower Extremity Assessment: RLE deficits/detail;LLE deficits/detail RLE Deficits / Details: Unable to fully assess PROM or strength secondary to pain. Pt with reduced hip IR,  noteable tightness, and preference to maintain RLE in slight knee flex and hip ER. Instructed pt to wiggles toes and he compensated for the movement using his hip. Pt demonstrated slight contraction of quad, 2-/5. RLE: Unable to fully assess due to pain RLE Sensation: WNL LLE Deficits / Details: Unable to fully assess PROM or strength secondary to pain. Pt with reduced hip IR, noteable tightness, and preference to maintain RLE in slight knee flex and hip ER. Instructed pt to wiggles toes and he compensated for the movement using his hip. Pt demonstrated slight contraction of quad, 2-/5. LLE: Unable to fully assess due to pain LLE Sensation: WNL    Cervical / Trunk Assessment Cervical / Trunk Assessment: Kyphotic  Communication   Communication Communication: Impaired Factors Affecting Communication: Hearing impaired (Pt's wife took his hearing aids home)    Cognition Arousal: Alert Behavior During Therapy: Anxious   PT - Cognitive impairments: No family/caregiver present to determine baseline                       PT - Cognition Comments: Pt was fearful of all mobility. He appeared to have delayed processing which may have been impacted d/t HOH. He was able to state his name and DOB. Following commands: Impaired Following  commands impaired: Only follows one step commands consistently, Follows one step commands with increased time     Cueing Cueing Techniques: Verbal cues, Gestural cues, Visual cues     General Comments General comments (skin integrity, edema, etc.): VSS on RA. No edema present in BLE. Skin integrity appears intact.  Recommend air mattress and medaboots. Pt demonstrated inability to press call button, needs a soft touch button.    Exercises     Assessment/Plan    PT Assessment Patient needs continued PT services  PT Problem List Decreased strength;Decreased range of motion;Decreased activity tolerance;Decreased balance;Decreased mobility;Decreased  coordination;Decreased knowledge of use of DME;Decreased safety awareness;Decreased knowledge of precautions;Pain       PT Treatment Interventions DME instruction;Functional mobility training;Therapeutic activities;Therapeutic exercise;Balance training;Neuromuscular re-education;Patient/family education;Wheelchair mobility training    PT Goals (Current goals can be found in the Care Plan section)  Acute Rehab PT Goals Patient Stated Goal: Get stronger PT Goal Formulation: With patient Time For Goal Achievement: 08/16/23 Potential to Achieve Goals: Fair    Frequency Min 1X/week     Co-evaluation PT/OT/SLP Co-Evaluation/Treatment: Yes Reason for Co-Treatment: Complexity of the patient's impairments (multi-system involvement);For patient/therapist safety;To address functional/ADL transfers PT goals addressed during session: Mobility/safety with mobility;Balance         AM-PAC PT "6 Clicks" Mobility  Outcome Measure Help needed turning from your back to your side while in a flat bed without using bedrails?: Total Help needed moving from lying on your back to sitting on the side of a flat bed without using bedrails?: Total Help needed moving to and from a bed to a chair (including a wheelchair)?: Total Help needed standing up from a chair using your arms (e.g., wheelchair or bedside chair)?: Total Help needed to walk in hospital room?: Total Help needed climbing 3-5 steps with a railing? : Total 6 Click Score: 6    End of Session   Activity Tolerance: Patient limited by pain Patient left: in bed;with call bell/phone within reach;with bed alarm set Nurse Communication: Mobility status;Other (comment) (Recommendations for an air mattress, medaboots, and need for a soft touch call bell.) PT Visit Diagnosis: Muscle weakness (generalized) (M62.81);Other symptoms and signs involving the nervous system (R29.898);History of falling (Z91.81);Unsteadiness on feet (R26.81);Other abnormalities  of gait and mobility (R26.89)    Time: 1610-9604 PT Time Calculation (min) (ACUTE ONLY): 26 min   Charges:   PT Evaluation $PT Eval Moderate Complexity: 1 Mod   PT General Charges $$ ACUTE PT VISIT: 1 Visit         Cheri Guppy, PT, DPT Acute Rehabilitation Services Office: [phone] Secure Chat Preferred  Richardson Chiquito 08/02/2023, 10:27 AM

## 2023-08-03 ENCOUNTER — Ambulatory Visit: Admitting: Neurology

## 2023-08-03 DIAGNOSIS — J42 Unspecified chronic bronchitis: Secondary | ICD-10-CM | POA: Diagnosis not present

## 2023-08-03 DIAGNOSIS — G610 Guillain-Barre syndrome: Secondary | ICD-10-CM | POA: Diagnosis not present

## 2023-08-03 DIAGNOSIS — I10 Essential (primary) hypertension: Secondary | ICD-10-CM | POA: Diagnosis not present

## 2023-08-03 LAB — VDRL, CSF: VDRL Quant, CSF: NONREACTIVE

## 2023-08-03 LAB — VITAMIN B6: Vitamin B6: 17.2 ug/L (ref 3.4–65.2)

## 2023-08-03 LAB — VZV PCR, CSF: VZV PCR, CSF: NEGATIVE

## 2023-08-03 MED ORDER — MIRTAZAPINE 15 MG PO TABS
7.5000 mg | ORAL_TABLET | Freq: Every day | ORAL | Status: DC
  Administered 2023-08-03 – 2023-08-08 (×6): 7.5 mg via ORAL
  Filled 2023-08-03 (×6): qty 1

## 2023-08-03 MED ORDER — QUETIAPINE FUMARATE 25 MG PO TABS
25.0000 mg | ORAL_TABLET | Freq: Every day | ORAL | Status: DC | PRN
  Administered 2023-08-03 – 2023-08-06 (×3): 25 mg via ORAL
  Filled 2023-08-03 (×4): qty 1

## 2023-08-03 MED ORDER — MELATONIN 5 MG PO TABS
5.0000 mg | ORAL_TABLET | Freq: Every day | ORAL | Status: DC
  Administered 2023-08-03 – 2023-08-08 (×6): 5 mg via ORAL
  Filled 2023-08-03 (×6): qty 1

## 2023-08-03 MED ORDER — DIPHENHYDRAMINE HCL 50 MG/ML IJ SOLN
25.0000 mg | INTRAMUSCULAR | Status: AC
  Administered 2023-08-03 – 2023-08-07 (×5): 25 mg via INTRAVENOUS
  Filled 2023-08-03 (×5): qty 1

## 2023-08-03 MED ORDER — QUETIAPINE FUMARATE 25 MG PO TABS: 25.0000 mg | ORAL_TABLET | Freq: Every day | ORAL | Status: DC

## 2023-08-03 MED ORDER — ACETAMINOPHEN 325 MG PO TABS
650.0000 mg | ORAL_TABLET | ORAL | Status: DC
  Administered 2023-08-03 – 2023-08-09 (×7): 650 mg via ORAL
  Filled 2023-08-03 (×7): qty 2

## 2023-08-03 MED ORDER — IMMUNE GLOBULIN (HUMAN) 10 GM/100ML IV SOLN
400.0000 mg/kg | INTRAVENOUS | Status: AC
  Administered 2023-08-03 – 2023-08-07 (×5): 25 g via INTRAVENOUS
  Filled 2023-08-03 (×6): qty 250

## 2023-08-03 NOTE — Progress Notes (Signed)
 Orthopedic Tech Progress Note Patient Details:  Jaryn Rosko October 31, 1940 [id number] Applied Prafo boots per order. Ortho Devices Type of Ortho Device: Prafo boot/shoe Ortho Device/Splint Location: BLE Ortho Device/Splint Interventions: Ordered, Application, Adjustment   Post Interventions Patient Tolerated: Well Instructions Provided: Adjustment of device, Care of device  Blase Mess 08/03/2023, 10:17 PM

## 2023-08-03 NOTE — Progress Notes (Signed)
  Inpatient Rehab Admissions Coordinator :  Per therapy change in recommendations, patient was screened for CIR candidacy by Ottie Glazier RN MSN. Noted has begun IVIG for 5 days. I am hopeful that his tolerance for more extensive therapies at CIR level will improve with his IVIG treatment.  At this time patient appears to be a potential candidate for CIR. I will place a rehab consult per protocol for full assessment. Please call me with any questions.  Ottie Glazier RN MSN Admissions Coordinator [phone]

## 2023-08-03 NOTE — Progress Notes (Signed)
 Physical Therapy Treatment Patient Details Name: Peter Stone MRN: [id number] DOB: 19-Jan-1941 Today's Date: 08/03/2023   History of Present Illness Patient is an 83 yo male presenting to the ED with progressive weakness on 08/01/23. MRI of the lumbar spine showing enhancement of nerve roots concerning for inflammatory versus infectious versus low suspicion neoplastic process. Lumbar puncture on 08/02/23 positive for albuminocytogenic dissociation, pt being worked up for GBS. PMH includes: COPD, HTN, HLD, HOH, OSA.    PT Comments  Patient resting in bed and sleeping but easily alerted and agreeable to work with therapy. Pt slumped to Rt in bed and max assist to reposition for centered, bed adjusted to chair position for exercises. Bil UE AAROM completed for NMR with greater activation noted on Lt UE compared to Rt. Pt followed simple cues well and multistep cues with increased time and some difficulty. Pt noted to be able to activate bil ankles for Df/Pf and AAROM/PROM completed to bil LE to facilitate NMR and maintain muscle elasticity and joint mobility. Total/Max+2 assist for pivot to EOB today and pt demonstrate significant fear of falling however calmed easily with emotional support and reminders of physical support to stabilize balance. Pt able to hold seated balance with Min/CGA posterior from tech, min-mod assist w/ bil UE support anteriorly from therapist. Pt performed trunk weight shifting activities and LE exercises sitting EOB prior to return to supine. Total time EOB ~ 20 minutes with HR noted to be in 120's throughout. EOS pt repositioned and NT arrived for pericare. Pt demonstrated improved tolerance to therapy today and strong motivation to progress mobility despite fear of falling. Recommendations updated as patient will benefit from intensive inpatient follow-up therapy, >3 hours/day. Will continue to progress as able.    If plan is discharge home, recommend the following: Two people to  help with walking and/or transfers;Two people to help with bathing/dressing/bathroom;Assistance with cooking/housework;Assistance with feeding;Direct supervision/assist for medications management;Direct supervision/assist for financial management;Assist for transportation;Help with stairs or ramp for entrance   Can travel by private vehicle     No  Equipment Recommendations   (defer to next venue)    Recommendations for Other Services Rehab consult     Precautions / Restrictions Precautions Precautions: Fall Recall of Precautions/Restrictions: Intact Precaution/Restrictions Comments: fear of falling Restrictions Weight Bearing Restrictions Per Provider Order: No     Mobility  Bed Mobility Overal bed mobility: Needs Assistance Bed Mobility: Supine to Sit, Sit to Supine, Rolling Rolling: Max assist, Used rails, +2 for physical assistance, +2 for safety/equipment   Supine to sit: Total assist, +2 for physical assistance, +2 for safety/equipment, HOB elevated Sit to supine: Total assist, +2 for physical assistance, +2 for safety/equipment, HOB elevated   General bed mobility comments: Pt placed in chair position for bed level exrecises at start. Pt able to initaite reaching Rt UE to Lt bed rail with ultimately max assist to fully reach rail, pt unable to maintain adequate grip of rail to sustian position. Pt required pivot with bed pad for turn to bring LE's off EOB and raise trunk, +2 max assist. Pt intermittently holding balance with bil UE grip on therapist in front and Min support/CGA posteriorly for safety. When pt fatigued posterior support provided for rest. Pt completed trunk weight shift exercises (see exs section for details) and LE exercises at EOB. Rt/Lt forearm prop performed with greater difficulty supporting trunk on Rt UE. Cues to sit up and pt attempted to press through Rt LE mod assist needed to return  to midline. On Lt forearm prop pt more stable and able to press up  through Lt Ue extending fully with Min assist to rise the remainder to midline. Total +2 for safety with return to supine.    Transfers                   General transfer comment: unsafe this date    Ambulation/Gait                   Stairs             Wheelchair Mobility     Tilt Bed    Modified Rankin (Stroke Patients Only)       Balance Overall balance assessment: Needs assistance Sitting-balance support: Bilateral upper extremity supported, Feet supported Sitting balance-Leahy Scale: Poor Sitting balance - Comments: relaint on posterior support initially and when fatigued. Intermittent moments of Min assist for balance with pt holding therapists hands for support. Postural control: Posterior lean   Standing balance-Leahy Scale: Zero Standing balance comment: NT due to safety                            Communication Communication Communication: Impaired Factors Affecting Communication: Hearing impaired  Cognition Arousal: Alert Behavior During Therapy: WFL for tasks assessed/performed, Anxious   PT - Cognitive impairments: No family/caregiver present to determine baseline                       PT - Cognition Comments: pt able to answer all questions appropriately, oriented to all but exact date/time, pt HOH and this impacting some of conversation. pt requires some extra time to process. Has a strong fear of falling and is anxious when moving to EOB. Following commands: Impaired Following commands impaired: Only follows one step commands consistently, Follows one step commands with increased time, Follows multi-step commands with increased time, Follows multi-step commands inconsistently    Cueing Cueing Techniques: Verbal cues, Gestural cues, Visual cues  Exercises Other Exercises Other Exercises: Bil UE AAROM in chair position in bed (pt gripping towel with bil hands and assit on both side from therapist and tech): 1x10  shoulder flexion, 10x biceps curl Other Exercises: Bil UE AAROM: 1x10 shoulder shrugs, 10x scapular retraction/shoulder extension Other Exercises: Bil LE AA/PROM: 10 reps ankle DF/PF, 10 reps knee flex/ext, 10 reps hip abd/add Other Exercises: Seated EOB Trunk Weight shifts: 5 reps bil UE push/pull for anterior/posterior trunk lean; Rt forearm prop and Lt forearm prop with pt pressin gup to return to midline and min-mod assist. Other Exercises: bil LE AAROM seated EOB: 10 reps LAQ    General Comments        Pertinent Vitals/Pain Pain Assessment Pain Assessment: Faces Faces Pain Scale: Hurts even more Pain Location: Bil LE with mobility, some fear of falling playing a roll in this Pain Descriptors / Indicators: Grimacing, Guarding, Discomfort, Sore Pain Intervention(s): Limited activity within patient's tolerance, Monitored during session, Repositioned    Home Living                          Prior Function            PT Goals (current goals can now be found in the care plan section) Acute Rehab PT Goals Patient Stated Goal: Get stronger PT Goal Formulation: With patient Time For Goal Achievement: 08/16/23 Potential to Achieve Goals: Fair Progress  towards PT goals: Progressing toward goals    Frequency    Min 2X/week      PT Plan      Co-evaluation              AM-PAC PT "6 Clicks" Mobility   Outcome Measure  Help needed turning from your back to your side while in a flat bed without using bedrails?: Total Help needed moving from lying on your back to sitting on the side of a flat bed without using bedrails?: Total Help needed moving to and from a bed to a chair (including a wheelchair)?: Total Help needed standing up from a chair using your arms (e.g., wheelchair or bedside chair)?: Total Help needed to walk in hospital room?: Total Help needed climbing 3-5 steps with a railing? : Total 6 Click Score: 6    End of Session Equipment Utilized  During Treatment: Gait belt Activity Tolerance: Patient limited by pain Patient left: in bed;with call bell/phone within reach;with bed alarm set;with family/visitor present Nurse Communication: Mobility status;Other (comment) (Recommendations for an air mattress, PRAFOs, and need for a soft touch call bell.) PT Visit Diagnosis: Muscle weakness (generalized) (M62.81);Other symptoms and signs involving the nervous system (R29.898);History of falling (Z91.81);Unsteadiness on feet (R26.81);Other abnormalities of gait and mobility (R26.89)     Time: 1027-2536 PT Time Calculation (min) (ACUTE ONLY): 44 min  Charges:    $Therapeutic Activity: 8-22 mins $Neuromuscular Re-education: 23-37 mins PT General Charges $$ ACUTE PT VISIT: 1 Visit                     Wynn Maudlin, DPT Acute Rehabilitation Services Office [phone]  08/03/23 4:14 PM

## 2023-08-03 NOTE — Progress Notes (Signed)
 Patient NIF and VC obtained with good effort. NIF -30 VC 2L

## 2023-08-03 NOTE — TOC Progression Note (Addendum)
 Transition of Care [facility]) - Progression Note    Patient Details  Name: Peter Stone MRN: [id number] Date of Birth: 06/02/1940  [facility] CM/SW Contact  Alesia Richards, RN 08/03/2023, 12:26 PM  Clinical Narrative:     CM discussed case with Dr. Jerral Ralph regarding discharge care planning. Hospital day 2 with diagnosis of Guillain Barre Syndrome. Per Dr. Jerral Ralph patient initiated on IVIG and estimated date of discharge is 08/08/2023. Noted, accepting SNFs, [facility] [address] [facility], [facility], [facility], [place] and [place] and [facility] [place] [facility]. CM to patient's room regarding SNF preferences. Patient provided RN CM permission to call patient's wife Peter Stone. CM call to patient's wife, regarding SNF preferences. Per patient's wife, has arrived to patient's bedside. CM to patient's room provided patient list of accepting SNFs to make SNF preferences and contact number for patient's wife to contact RN CM. Patient has [facility] Advantage plan and will require auth for SNF placement.   Alert received from RN Selena Batten regarding patient's wife requested RN CM to call patient's wife back . RN CM call patient's wife, Peter Stone back regarding SNF preferences. Patient's wife requests RN CM call Admissions, [facility]. CM call to Admissions, [facility] regarding bed availability. CM spoke to [facility]. Per [facility], will have clinical team to review SNF referral and call RN CM back. CM follow up call placed to patient's wife regarding SNF clinical review. Patient's wife verbalized understanding and agreement with plan. Expected Discharge Plan: Skilled Nursing Facility Barriers to Discharge: Continued Medical Work up, English as a second language teacher, SNF Pending bed offer  Expected Discharge Plan and Services In-house Referral: Clinical Social Work     Living arrangements for the past 2 months: Single Family Home                  l  Determinants of Health (SDOH) Interventions SDOH Screenings   Food Insecurity: No Food Insecurity (08/01/2023)  Housing: Low Risk  (08/01/2023)  Transportation Needs: No Transportation Needs (08/01/2023)  Utilities: Not At Risk (08/01/2023)  Social Connections: Moderately Isolated (08/01/2023)  Tobacco Use: Low Risk  (08/01/2023)    Readmission Risk Interventions     No data to display

## 2023-08-03 NOTE — Plan of Care (Signed)
  Problem: Education: Goal: Knowledge of General Education information will improve Description: Including pain rating scale, medication(s)/side effects and non-pharmacologic comfort measures 08/03/2023 0700 by Claire Shown, RN Outcome: Progressing 08/03/2023 0700 by Claire Shown, RN Outcome: Progressing   Problem: Health Behavior/Discharge Planning: Goal: Ability to manage health-related needs will improve 08/03/2023 0700 by Claire Shown, RN Outcome: Progressing 08/03/2023 0700 by Claire Shown, RN Outcome: Progressing   Problem: Clinical Measurements: Goal: Ability to maintain clinical measurements within normal limits will improve 08/03/2023 0700 by Claire Shown, RN Outcome: Progressing 08/03/2023 0700 by Claire Shown, RN Outcome: Progressing Goal: Will remain free from infection 08/03/2023 0700 by Claire Shown, RN Outcome: Progressing 08/03/2023 0700 by Claire Shown, RN Outcome: Progressing Goal: Diagnostic test results will improve 08/03/2023 0700 by Claire Shown, RN Outcome: Progressing 08/03/2023 0700 by Claire Shown, RN Outcome: Progressing

## 2023-08-03 NOTE — Plan of Care (Signed)

## 2023-08-03 NOTE — Progress Notes (Signed)
 PROGRESS NOTE        PATIENT DETAILS Name: Peter Stone Age: 83 y.o. Sex: male Date of Birth: Jan 13, 1941 Admit Date: 08/01/2023 Admitting Physician Therisa Doyne, MD AYT:[id number], Sherie Don, MD  Brief Summary: Patient is a 83 y.o.  male with history of COPD, HTN, HLD, hard of hearing-who presented with progressive lower extremity weakness-(ongoing since Christmas-started using a walker)-but progressively worse over the past several weeks to the point that he has stopped ambulating.  He was evaluated by neurology-with concern for demyelinating syndrome like Guillain-Barr-and subsequently admitted to the hospitalist service.  See below for further details.  Significant events: 4/1>> admit to [facility]-progressive lower extremity weakness  Significant studies: 3/28>> MRI C-spine: Cervical spondylosis/degenerative disc disease. 3/28>> MRI L-spine: Ventral nerve root enhancement and cauda equina. 4/01>> MRI T-spine: Contrast enhancement of visualized nerve roots in the cauda equina. 4/01>> abdominal x-ray: Large stool burden.  Significant microbiology data: 4/01>> COVID/influenza/RSV PCR: Negative 4/02> CSF culture: No growth 4/02>> CSF meningitis/encephalitis panel: Negative  Procedures: 4/02>> LP  Consults: Neurology  Subjective: Some confusion last night-slightly drowsy but relatively awake/alert this morning.  Objective: Vitals: Blood pressure (!) 138/123, pulse 93, temperature 98.9 F (37.2 C), temperature source Oral, resp. rate (!) 24, height 5\' 10"  (1.778 m), weight 63.3 kg, SpO2 100%.   Exam: Gen Exam:Alert awake-not in any distress-looks send chronically sick appearing/frail. HEENT:atraumatic, normocephalic Chest: B/L clear to auscultation anteriorly CVS:S1S2 regular Abdomen:soft non tender, non distended Extremities:no edema Neurology: Not able to lift bilateral lower extremities off the bed-some more slight distal upper extremity  weakness bilaterally.  No reflexes obtained in lower extremities. Skin: no rash  Pertinent Labs/Radiology:    Latest Ref Rng & Units 08/02/2023    4:55 AM 08/01/2023    1:50 PM 06/23/2023    2:30 PM  CBC  WBC 4.0 - 10.5 K/uL 8.0  7.2  9.1   Hemoglobin 13.0 - 17.0 g/dL 35.5  73.2  20.2   Hematocrit 39.0 - 52.0 % 40.1  44.5  41.1   Platelets 150 - 400 K/uL 247  253  202     Lab Results  Component Value Date   NA 140 08/02/2023   K 3.8 08/02/2023   CL 110 08/02/2023   CO2 22 08/02/2023      Assessment/Plan: Progressive lower extremity>> upper extremity weakness Concern for GBS/CIDP CSF positive for albuminocytogenic dissociation Extensive studies still pending Discussed with neurology-IVIG x 5 days to be started today Continue to monitor NIF/FVC closely Await further recommendations from neurology Mobilize with PT/OT.  Delirium Delirious last night Will schedule low-dose Seroquel/melatonin and see how he does. Delirium precautions  COPD Stable Bronchodilators  HTN BP stable on low-dose amlodipine Per H&P-patient not taking HCTZ/lisinopril.    Hard of hearing  Nutrition Status: Nutrition Problem: Severe Malnutrition Etiology: chronic illness (COPD and HTN exacerbated by acute event requiring hospitalization) Signs/Symptoms: severe fat depletion, severe muscle depletion, percent weight loss Interventions: Boost Plus, Magic cup, Refer to RD note for recommendations, MVI    Code status:   Code Status: Full Code   DVT Prophylaxis: SCDs Start: 08/01/23 2147   Family Communication:Spouse- Leia Alf [phone] -left VM 4/3   Disposition Plan: Status is: Inpatient Remains inpatient appropriate because: Severity of illness   Planned Discharge Destination:Home   Diet: Diet Order  Diet regular Room service appropriate? Yes with Assist; Fluid consistency: Thin  Diet effective now                     Antimicrobial agents: Anti-infectives (From  admission, onward)    None        MEDICATIONS: Scheduled Meds:  acetaminophen  650 mg Oral Q24H   amLODipine  5 mg Oral Daily   diphenhydrAMINE  25 mg Intravenous Q24H   lactose free nutrition  237 mL Oral TID WC   lidocaine  10 mL Intradermal Once   multivitamin with minerals  1 tablet Oral Daily   Continuous Infusions:  Immune Globulin 10%     PRN Meds:.acetaminophen **OR** acetaminophen, albuterol, HYDROcodone-acetaminophen, ondansetron **OR** ondansetron (ZOFRAN) IV   I have personally reviewed following labs and imaging studies  LABORATORY DATA: CBC: Recent Labs  Lab 08/01/23 1350 08/02/23 0455  WBC 7.2 8.0  NEUTROABS 4.4  --   HGB 14.7 13.4  HCT 44.5 40.1  MCV 96.1 94.8  PLT 253 247    Basic Metabolic Panel: Recent Labs  Lab 08/01/23 1350 08/02/23 0059 08/02/23 0455  NA 139  --  140  K 4.4  --  3.8  CL 106  --  110  CO2 22  --  22  GLUCOSE 104*  --  95  BUN 40*  --  36*  CREATININE 0.62  --  0.60*  CALCIUM 9.1  --  8.9  MG  --  2.1 2.0  PHOS  --  3.3 3.4    GFR: Estimated Creatinine Clearance: 60.4 mL/min (A) (by C-G formula based on SCr of 0.6 mg/dL (L)).  Liver Function Tests: Recent Labs  Lab 08/01/23 1350 08/02/23 0455  AST 38 33  ALT 43 41  ALKPHOS 113 98  BILITOT 1.0 1.0  PROT 5.8* 4.5*  ALBUMIN 3.0* 2.6*   No results for input(s): "LIPASE", "AMYLASE" in the last 168 hours. No results for input(s): "AMMONIA" in the last 168 hours.  Coagulation Profile: Recent Labs  Lab 08/02/23 0059  INR 1.0    Cardiac Enzymes: Recent Labs  Lab 08/02/23 0059  CKTOTAL 60    BNP (last 3 results) No results for input(s): "PROBNP" in the last 8760 hours.  Lipid Profile: No results for input(s): "CHOL", "HDL", "LDLCALC", "TRIG", "CHOLHDL", "LDLDIRECT" in the last 72 hours.  Thyroid Function Tests: Recent Labs    08/02/23 0059  TSH 1.046    Anemia Panel: Recent Labs    08/01/23 1817  VITAMINB12 612  FOLATE >40.0     Urine analysis:    Component Value Date/Time   COLORURINE YELLOW 06/23/2023 1435   APPEARANCEUR CLEAR 06/23/2023 1435   LABSPEC 1.013 06/23/2023 1435   PHURINE 5.0 06/23/2023 1435   GLUCOSEU NEGATIVE 06/23/2023 1435   HGBUR NEGATIVE 06/23/2023 1435   BILIRUBINUR NEGATIVE 06/23/2023 1435   KETONESUR 20 (A) 06/23/2023 1435   PROTEINUR NEGATIVE 06/23/2023 1435   NITRITE NEGATIVE 06/23/2023 1435   LEUKOCYTESUR NEGATIVE 06/23/2023 1435    Sepsis Labs: Lactic Acid, Venous No results found for: "LATICACIDVEN"  MICROBIOLOGY: Recent Results (from the past 240 hours)  Resp panel by RT-PCR (RSV, Flu A&B, Covid) Anterior Nasal Swab     Status: None   Collection Time: 08/01/23  1:51 PM   Specimen: Anterior Nasal Swab  Result Value Ref Range Status   SARS Coronavirus 2 by RT PCR NEGATIVE NEGATIVE Final   Influenza A by PCR NEGATIVE NEGATIVE Final   Influenza  B by PCR NEGATIVE NEGATIVE Final    Comment: (NOTE) The Xpert Xpress SARS-CoV-2/FLU/RSV plus assay is intended as an aid in the diagnosis of influenza from Nasopharyngeal swab specimens and should not be used as a sole basis for treatment. Nasal washings and aspirates are unacceptable for Xpert Xpress SARS-CoV-2/FLU/RSV testing.  Fact Sheet for Patients: BloggerCourse.com  Fact Sheet for Healthcare Providers: SeriousBroker.it  This test is not yet approved or cleared by the [place] FDA and has been authorized for detection and/or diagnosis of SARS-CoV-2 by FDA under an Emergency Use Authorization (EUA). This EUA will remain in effect (meaning this test can be used) for the duration of the COVID-19 declaration under Section 564(b)(1) of the Act, 21 U.S.C. section 360bbb-3(b)(1), unless the authorization is terminated or revoked.     Resp Syncytial Virus by PCR NEGATIVE NEGATIVE Final    Comment: (NOTE) Fact Sheet for  Patients: BloggerCourse.com  Fact Sheet for Healthcare Providers: SeriousBroker.it  This test is not yet approved or cleared by the [place] FDA and has been authorized for detection and/or diagnosis of SARS-CoV-2 by FDA under an Emergency Use Authorization (EUA). This EUA will remain in effect (meaning this test can be used) for the duration of the COVID-19 declaration under Section 564(b)(1) of the Act, 21 U.S.C. section 360bbb-3(b)(1), unless the authorization is terminated or revoked.  Performed at [facility], [address] [place], [place] [address]   CSF culture w Gram Stain     Status: None (Preliminary result)   Collection Time: 08/02/23 12:59 PM   Specimen: PATH Cytology CSF; Cerebrospinal Fluid  Result Value Ref Range Status   Specimen Description CSF  Final   Special Requests NONE  Final   Gram Stain   Final    WBC PRESENT, PREDOMINANTLY MONONUCLEAR NO ORGANISMS SEEN CYTOSPIN SMEAR    Culture   Final    NO GROWTH < 24 HOURS Performed at [facility], [address] [place], [place] [address]    Report Status PENDING  Incomplete    RADIOLOGY STUDIES/RESULTS: DG FL GUIDED LUMBAR PUNCTURE Result Date: 08/02/2023 CLINICAL DATA:  83 year old male with progressive weakness of bilateral lower extremities for the past few months following a viral infection in December. Now with weakness in bilateral upper extremities; concerning for Guillain-Barrre syndrome. Request for lumbar puncture. EXAM: LUMBAR PUNCTURE UNDER FLUOROSCOPY PROCEDURE: An appropriate skin entry site was determined fluoroscopically. Operator donned sterile gloves and mask. Skin site was marked, then prepped with Betadine, draped in usual sterile fashion, and infiltrated locally with 1% lidocaine. A 20 gauge spinal needle advanced into the thecal sac at L4-5 from a right interlaminar approach. Clear colorless CSF spontaneously returned, with  opening pressure of 20 cm water. 10 ml CSF were collected and divided among 4 sterile vials for the requested laboratory studies. The needle was then removed. The patient tolerated the procedure well and there were no complications. FLUOROSCOPY: Radiation Exposure Index (as provided by the fluoroscopic device): 8.8 mGy Kerma IMPRESSION: Technically successful lumbar puncture under fluoroscopy. This exam was performed by Sherlene Shams, PA-C, and was supervised and interpreted by Dr. Mosie Epstein. Electronically Signed   By: Gilmer Mor D.O.   On: 08/02/2023 14:00   MR THORACIC SPINE W WO CONTRAST Result Date: 08/01/2023 CLINICAL DATA:  Lumbar myelopathy. EXAM: MRI THORACIC WITHOUT AND WITH CONTRAST TECHNIQUE: Multiplanar and multiecho pulse sequences of the thoracic spine were obtained without and with intravenous contrast. CONTRAST:  8mL GADAVIST GADOBUTROL 1  MMOL/ML IV SOLN COMPARISON:  Lumbar spine MRI 07/29/2018 FINDINGS: Alignment:  Physiologic. Vertebrae: No fracture, evidence of discitis, or bone lesion. Cord: Normal signal and morphology. Contrast enhancement of the visualized ventral nerve roots of the cauda equina. Paraspinal and other soft tissues: Left basilar atelectasis. Disc levels: T7-8: Small central disc protrusion without spinal canal stenosis. The other disc levels are unremarkable. IMPRESSION: 1. Contrast enhancement of the visualized ventral nerve roots of the cauda equina. As previously described this can be seen in the setting of Guillain-Barre syndrome. 2. Small central disc protrusion at T7-8 without spinal canal stenosis. Electronically Signed   By: Deatra Robinson M.D.   On: 08/01/2023 23:42   MR BRAIN W WO CONTRAST Result Date: 08/01/2023 CLINICAL DATA:  Altered mental status EXAM: MRI HEAD WITHOUT AND WITH CONTRAST TECHNIQUE: Multiplanar, multiecho pulse sequences of the brain and surrounding structures were obtained without and with intravenous contrast. CONTRAST:  8mL GADAVIST  GADOBUTROL 1 MMOL/ML IV SOLN COMPARISON:  04/07/2021 FINDINGS: Brain: No acute infarct, mass effect or extra-axial collection. No acute or chronic hemorrhage. There is multifocal hyperintense T2-weighted signal within the white matter. Parenchymal volume and CSF spaces are normal. The midline structures are normal. There is no abnormal contrast enhancement. Vascular: Normal flow voids. Skull and upper cervical spine: Normal calvarium and skull base. Visualized upper cervical spine and soft tissues are normal. Sinuses/Orbits:No paranasal sinus fluid levels or advanced mucosal thickening. No mastoid or middle ear effusion. Normal orbits. IMPRESSION: 1. No acute intracranial abnormality. 2. Findings of chronic small vessel ischemia. Electronically Signed   By: Deatra Robinson M.D.   On: 08/01/2023 23:31   DG Chest Port 1 View Result Date: 08/01/2023 CLINICAL DATA:  Generalized weakness and dehydration EXAM: PORTABLE CHEST 1 VIEW COMPARISON:  Radiograph 06/23/2023 FINDINGS: The heart size and mediastinal contours are within normal limits. Both lungs are clear. The visualized skeletal structures are unremarkable. IMPRESSION: No active disease. Electronically Signed   By: Minerva Fester M.D.   On: 08/01/2023 21:19   DG Abd 1 View Result Date: 08/01/2023 CLINICAL DATA:  Generalized weakness, dehydration, left leg cramping, poor intake and frequent urine is a shin EXAM: ABDOMEN - 1 VIEW COMPARISON:  Radiograph 02/27/2015 FINDINGS: Large colonic stool burden. No evidence of bowel obstruction. No acute osseous abnormality. IMPRESSION: Large colonic stool burden.  No evidence of obstruction. Electronically Signed   By: Minerva Fester M.D.   On: 08/01/2023 21:19     LOS: 2 days   Jeoffrey Massed, MD  [facility]    To contact the attending provider between 7A-7P or the covering provider during after hours 7P-7A, please log into the web site [email or web address] and access using universal [place] password for  that web site. If you do not have the password, please call the hospital operator.  08/03/2023, 12:03 PM

## 2023-08-03 NOTE — Progress Notes (Signed)
 NIF - 25 with good efforts

## 2023-08-03 NOTE — Progress Notes (Signed)
 Patient NIF and VC obtained with good effort NIF-30 VC FEV 2.1 57% FEV1 1.72

## 2023-08-03 NOTE — Progress Notes (Signed)
 NEUROLOGY CONSULT FOLLOW UP NOTE   Date of service: August 03, 2023 Patient Name: Peter Stone MRN:  [id number] DOB:  29-Aug-1940  Interval Hx/subjective   Patient sitting up in bed, no family at bedside.   On exam, profound generalized weakness, worse in BLE with distal worse than proximal throughout. He has pain in bilateral calves when lifting his legs off the bed. Denies headache, vision changes, dizziness, SOB, numbness.tingling, recent illness or vaccinations. Patient states he has no appetite, and has not had a taste for food "in months". Endorses occasional incontinence, but states that he thinks its more that he can't move quickly enough to get there as opposed to not being able to control it.   Chart review does show decreased PO intake after a fall in February. He was seen by outpatient neurology for the progressive weakness on 3/21 where wife noted him slowing down over the last few years. Patient had a more acute decline after a viral illness in December 2024, with inability to stand a couple of weeks after that. PT was ordered. Afib was noted by therapist, which is a new diagnosis for patient. He is scheduled for OP cardiology 4/14.   On this admission, Vitamin B12, D, Folate WNL.   Vitals   Vitals:   08/02/23 2112 08/03/23 0003 08/03/23 0253 08/03/23 0355  BP: 137/75 132/72  (!) 141/83  Pulse: 97 78 93 96  Resp: 20 (!) 22 (!) 23 20  Temp: 97.9 F (36.6 C) 98.2 F (36.8 C)  98.9 F (37.2 C)  TempSrc: Oral Oral  Axillary  SpO2: 95% 93% 100% 100%  Weight:      Height:         Body mass index is 20.02 kg/m.  Physical Exam   Constitutional: Appears poorly nourished, NAD.  Psych: Flat affect.  Cardiovascular: SR, RBB and prolonged QT, hypertensive. Bilateral ankle edema  Respiratory: Effort normal, non-labored breathing. Room air.   Neurologic Examination   Neuro: Mental Status: Patient is awake, alert, oriented to person, place, month, year, and situation.  Flat affect.  His weakness increases with additional conversation, and he will take breaks in his speaking.  No signs of dysarthria, aphasia or neglect Cranial Nerves: II-XII grossly intact, including strong shoulder shrug bilaterally and hard of hearing at baseline. Motor: Tone is normal. Bulk is normal.  On upper extremities, the left side is stronger and on lower extremities the right side is stronger.  He is unable to lift his RUE off the bed at all, LUE can be lifted off bed slightly.  No dorsiflexion present in LLE, with strongly reduced plantar flexion.       Right  Left Shoulder Abduction  5/5  5/5 Elbow Flexion   3/5  4/5 Elbow Extension  3/5  4/5 Wrist Flexion   2/5  3/5 Wrist Extension  3/5  4-/5 Grip    3/5  4-/5  Hip Flexion   4/5  4/5 Knee Flexion   4-/5  3/5 Knee Extension  3/5  3/5 Ankle Dorsiflexion  4-/5  2/5 Ankle Plantarflexion  4-/5  3/5  Sensory: Sensation is symmetric to light touch in the arms and legs. Deep Tendon Reflexes: Continue to be absent throughout. Plantars: Bilaterally reduced Cerebellar: Unable to perform due to weakness Gait: Unable to assess, patient is bedbound due to weakness. Pending PT eval.    Medications  Current Facility-Administered Medications:    acetaminophen (TYLENOL) tablet 650 mg, 650 mg, Oral, Q6H PRN **OR** acetaminophen (TYLENOL)  suppository 650 mg, 650 mg, Rectal, Q6H PRN, Doutova, Anastassia, MD   albuterol (PROVENTIL) (2.5 MG/3ML) 0.083% nebulizer solution 2.5 mg, 2.5 mg, Nebulization, Q2H PRN, Doutova, Anastassia, MD   amLODipine (NORVASC) tablet 5 mg, 5 mg, Oral, Daily, Ghimire, Werner Lean, MD, 5 mg at 08/02/23 1015   HYDROcodone-acetaminophen (NORCO/VICODIN) 5-325 MG per tablet 1-2 tablet, 1-2 tablet, Oral, Q4H PRN, Adela Glimpse, Anastassia, MD   lactose free nutrition (BOOST PLUS) liquid 237 mL, 237 mL, Oral, TID WC, Ghimire, Shanker M, MD   lidocaine (XYLOCAINE) 2 % (with pres) injection 200 mg, 10 mL, Intradermal,  Once, Doutova, Anastassia, MD   multivitamin with minerals tablet 1 tablet, 1 tablet, Oral, Daily, Ghimire, Werner Lean, MD, 1 tablet at 08/02/23 1559   ondansetron (ZOFRAN) tablet 4 mg, 4 mg, Oral, Q6H PRN **OR** ondansetron (ZOFRAN) injection 4 mg, 4 mg, Intravenous, Q6H PRN, Therisa Doyne, MD  Labs and Diagnostic Imaging   CBC:  Recent Labs  Lab 08/01/23 1350 08/02/23 0455  WBC 7.2 8.0  NEUTROABS 4.4  --   HGB 14.7 13.4  HCT 44.5 40.1  MCV 96.1 94.8  PLT 253 247    Basic Metabolic Panel:  Lab Results  Component Value Date   NA 140 08/02/2023   K 3.8 08/02/2023   CO2 22 08/02/2023   GLUCOSE 95 08/02/2023   BUN 36 (H) 08/02/2023   CREATININE 0.60 (L) 08/02/2023   CALCIUM 8.9 08/02/2023   GFRNONAA >60 08/02/2023   GFRAA  07/10/2008    >60        The eGFR has been calculated using the MDRD equation. This calculation has not been validated in all clinical situations. eGFR's persistently <60 mL/min signify possible Chronic Kidney Disease.   Lipid Panel: No results found for: "LDLCALC" HgbA1c: No results found for: "HGBA1C" Urine Drug Screen: No results found for: "LABOPIA", "COCAINSCRNUR", "LABBENZ", "AMPHETMU", "THCU", "LABBARB"  Alcohol Level No results found for: "ETH" INR  Lab Results  Component Value Date   INR 1.0 08/02/2023   MRI Brain(Personally reviewed): No acute intracranial abnormality. Findings of chronic small vessel ischemia.  MRI L/T/C Spine: Substantial abnormal ventral nerve root enhancement in the cauda equina, with some low-level leptomeningeal enhancement along the anterior margin of the conus. Guillain-Barre syndrome is the most likely cause of this pattern. Small central disc protrusion at T7-8 without spinal canal stenosis. Cervical spondylosis and degenerative disc disease causing prominent impingement at C3-4 and C4-5; mild to moderate impingement at C2-3; and mild impingement at C6-7  LP 4/2: CSF Culture: Prelim negative VDRL:  Pending Cell count: Pending IgG CSF: Pending M/E Panel: Pending OG Bands: Pending Protein: 93 Glucose: 64 VZV PCR: Pending  Lyme Disease: Pending  Assessment   Peter Stone is a 83 y.o. male with PMH significant for HTN, OSA, HLD who presented 4/1 with progressive generalized weakness, proximal worse than distal, now developing BUE weakness and bulbar symptoms. 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 MRI L spine, however, also showed evidence of contrast enhancement seen in GBS.   Chart review does show decreased PO intake after a fall in February. He was seen by outpatient neurology for the progressive weakness on 3/21 where wife noted him slowing down over the last few years. Patient had a more acute decline after a viral illness in December 2024, with inability to stand a couple of weeks after that. PT was ordered. Afib was noted by therapist, which  is a new diagnosis for patient. He is scheduled for OP cardiology 4/14. EKGs this admission have shown SR with RBB and prolonged QT.   LP was completed under fluoro yesterday   Albumin is low, as expected with his recent history of low PO intake. This, of course, can contribute even further to the weakness seen in his likely diagnosis of GBS. Patient also takes remeron, which has a possible side effect of weakness and fatigue. However, patient just started on this medication 3/28.  Recommendations   - We will continue to follow labs - Continue PT/OT/SLP - Metabolic/electrolyte derangement management, as you are  - Start IVIG 2g/kg Q24 hours x5 (ordered) - Benadryl and tylenol pre-medication while on IVIG (ordered) ______________________________________________________________________    Pt seen by Neuro NP/APP and later by MD. Note/plan to be edited by MD as needed.    Lynnae January, DNP,  AGACNP-BC [facility] Please use AMION for contact information & EPIC for messaging.  I personally evaluated patient and agree with the assessment and plan above with the following addition.  Spoke with Dr. Loleta Chance about choice of therapy regarding possible CIDP given albuminocytologic dissociation seen on CSF and timing of symptoms.  We came to agreement that IVIG 2 g/kg divided over 5 days is the best course.  Patient agrees.   Anibal Henderson, MD [facility]

## 2023-08-04 ENCOUNTER — Inpatient Hospital Stay (HOSPITAL_COMMUNITY)

## 2023-08-04 DIAGNOSIS — G610 Guillain-Barre syndrome: Secondary | ICD-10-CM | POA: Diagnosis not present

## 2023-08-04 DIAGNOSIS — R0602 Shortness of breath: Secondary | ICD-10-CM

## 2023-08-04 DIAGNOSIS — I10 Essential (primary) hypertension: Secondary | ICD-10-CM | POA: Diagnosis not present

## 2023-08-04 DIAGNOSIS — J42 Unspecified chronic bronchitis: Secondary | ICD-10-CM | POA: Diagnosis not present

## 2023-08-04 LAB — BASIC METABOLIC PANEL WITH GFR
Anion gap: 9 (ref 5–15)
BUN: 34 mg/dL — ABNORMAL HIGH (ref 8–23)
CO2: 23 mmol/L (ref 22–32)
Calcium: 8.5 mg/dL — ABNORMAL LOW (ref 8.9–10.3)
Chloride: 106 mmol/L (ref 98–111)
Creatinine, Ser: 0.56 mg/dL — ABNORMAL LOW (ref 0.61–1.24)
GFR, Estimated: >60 mL/min (ref >60)
Glucose, Bld: 120 mg/dL — ABNORMAL HIGH (ref 70–99)
Potassium: 3.3 mmol/L — ABNORMAL LOW (ref 3.5–5.1)
Sodium: 138 mmol/L (ref 135–145)

## 2023-08-04 LAB — CBC
HCT: 37.5 % — ABNORMAL LOW (ref 39.0–52.0)
Hemoglobin: 12.9 g/dL — ABNORMAL LOW (ref 13.0–17.0)
MCH: 32.2 pg (ref 26.0–34.0)
MCHC: 34.4 g/dL (ref 30.0–36.0)
MCV: 93.5 fL (ref 80.0–100.0)
Platelets: 228 10*3/uL (ref 150–400)
RBC: 4.01 MIL/uL — ABNORMAL LOW (ref 4.22–5.81)
RDW: 15.4 % (ref 11.5–15.5)
WBC: 6.1 10*3/uL (ref 4.0–10.5)
nRBC: 0 % (ref 0.0–0.2)

## 2023-08-04 LAB — ECHOCARDIOGRAM COMPLETE
Height: 70 in
Weight: 2232.82 [oz_av]

## 2023-08-04 LAB — COPPER, SERUM: Copper: 126 ug/dL (ref 69–132)

## 2023-08-04 LAB — IGG CSF INDEX
Albumin CSF-mCnc: 56 mg/dL — ABNORMAL HIGH (ref 15–55)
Albumin: 3.1 g/dL — ABNORMAL LOW (ref 3.7–4.7)
CSF IgG Index: 0.5 (ref 0.0–0.7)
IgG (Immunoglobin G), Serum: 858 mg/dL (ref 603–1613)
IgG, CSF: 8.4 mg/dL (ref 0.0–10.3)
IgG/Alb Ratio, CSF: 0.15 (ref 0.00–0.25)

## 2023-08-04 LAB — MISC LABCORP TEST (SEND OUT): Labcorp test code: 505614

## 2023-08-04 LAB — LYME DISEASE DNA BY PCR(BORRELIA BURG): Lyme Disease(B.burgdorferi)PCR: NEGATIVE

## 2023-08-04 LAB — ZINC: Zinc: 100 ug/dL (ref 44–115)

## 2023-08-04 MED ORDER — POTASSIUM CHLORIDE CRYS ER 20 MEQ PO TBCR
40.0000 meq | EXTENDED_RELEASE_TABLET | Freq: Once | ORAL | Status: AC
Start: 2023-08-04 — End: 2023-08-04
  Administered 2023-08-04: 40 meq via ORAL
  Filled 2023-08-04: qty 2

## 2023-08-04 MED ORDER — TAMSULOSIN HCL 0.4 MG PO CAPS
0.4000 mg | ORAL_CAPSULE | Freq: Every day | ORAL | Status: DC
  Administered 2023-08-04 – 2023-08-09 (×6): 0.4 mg via ORAL
  Filled 2023-08-04 (×6): qty 1

## 2023-08-04 MED ORDER — PERFLUTREN LIPID MICROSPHERE
1.0000 mL | INTRAVENOUS | Status: AC | PRN
  Administered 2023-08-04: 2 mL via INTRAVENOUS

## 2023-08-04 MED ORDER — ENOXAPARIN SODIUM 40 MG/0.4ML IJ SOSY
40.0000 mg | PREFILLED_SYRINGE | INTRAMUSCULAR | Status: DC
  Administered 2023-08-04 – 2023-08-09 (×6): 40 mg via SUBCUTANEOUS
  Filled 2023-08-04 (×6): qty 0.4

## 2023-08-04 NOTE — Progress Notes (Signed)
 Patient NIF and NV obtained. Poor effort, as pt sleepy and had to be repeatedly wakened for attempts. NIF -30. FEV 1.6

## 2023-08-04 NOTE — Progress Notes (Addendum)
 Occupational Therapy Treatment Patient Details Name: Peter Stone MRN: [id number] DOB: [birth year] Today's Date: 08/04/2023   History of present illness Patient is an 83 yo male presenting to the ED with progressive weakness on 08/01/23. MRI of the lumbar spine showing enhancement of nerve roots concerning for inflammatory versus infectious versus low suspicion neoplastic process. Lumbar puncture on 08/02/23 positive for albuminocytogenic dissociation, pt being worked up for GBS. PMH includes: COPD, HTN, HLD, HOH, OSA.   OT comments  Session focus on increasing overall activity tolerance with PT. Patient remains anxious, though increased grip strength noted in L hand (2/5) and minimal activation noted at quads and in abdomen when signifcant effort employed. Patient remains total A of 2 to transition EOB, but able to work on activity tolerance and balance with weight shifting and attempting minimal ADL managment. Patient remains anxious with all movements. Patient's wife present for session, and stating he has needed significant assist since Feb '25 with BLE pain starting on Christmas Eve. Patient's wife hesitant that patient can tolerate >3 hours of therapy as patient hardly eats (3 Boosts a day at best) and simply doesnt have the strength to participate. OT continues to endorse the recommendation of skilled services of a lesser intensity < 3 hours. OT will continue to follow acutely.      If plan is discharge home, recommend the following:  Two people to help with walking and/or transfers;Two people to help with bathing/dressing/bathroom;Assistance with cooking/housework;Assistance with feeding;Direct supervision/assist for medications management;Direct supervision/assist for financial management;Assist for transportation;Help with stairs or ramp for entrance;Supervision due to cognitive status   Equipment Recommendations   (defer to next venue)    Recommendations for Other Services       Precautions / Restrictions Precautions Precautions: Fall Recall of Precautions/Restrictions: Intact Precaution/Restrictions Comments: fear of falling Restrictions Weight Bearing Restrictions Per Provider Order: No       Mobility Bed Mobility Overal bed mobility: Needs Assistance Bed Mobility: Supine to Sit, Sit to Supine     Supine to sit: Max assist, Total assist, +2 for safety/equipment, +2 for physical assistance, HOB elevated, Used rails Sit to supine: Max assist, Total assist, +2 for physical assistance, +2 for safety/equipment   General bed mobility comments: Pt agreeable to attempting to move BLE to EOB, requires max assist to do so. Max<>total assist +2 for supine<>sit.    Transfers                   General transfer comment: deferred due to ability     Balance Overall balance assessment: Needs assistance Sitting-balance support: Bilateral upper extremity supported, Feet supported Sitting balance-Leahy Scale: Poor   Postural control: Posterior lean                                 ADL either performed or assessed with clinical judgement   ADL Overall ADL's : Needs assistance/impaired Eating/Feeding: Total assistance;Sitting   Grooming: Wash/dry hands;Wash/dry face;Total assistance;Sitting                   Toilet Transfer: Total assistance;+2 for physical assistance;+2 for safety/equipment Toilet Transfer Details (indicate cue type and reason): bed level Toileting- Clothing Manipulation and Hygiene: Total assistance;+2 for physical assistance;+2 for safety/equipment;Bed level       Functional mobility during ADLs: Total assistance;+2 for physical assistance;+2 for safety/equipment;Cueing for safety;Cueing for sequencing General ADL Comments: Session focus on increasing overall activity tolerance with PT.  Patient remains anxious, though increased grip strength noted in L hand (2/5) and minimal activation noted at quads and in  abdomen when signifcant effort employed. Patient remains total A of 2 to transition EOB, but able to work on activity tolerance and balance with weight shifting and attemptin minimal ADL managment. Patient's wife present for session, and stating he has needed significant assist since Feb '25 with BLE pain stating on Christmas Eve. Patient's wife hesitant that patient can tolerate >3 hours of therapy as patient hardly eats (3 Boosts a day at best) and simply doesnt have the strength to participate. OT continues to endorse the recommendation of skilled services of a lesser intensity < 3 hours. OT will continue to follow acutely.    Extremity/Trunk Assessment              Occupational psychologist Communication: Impaired Factors Affecting Communication: Hearing impaired (hearing aides)   Cognition Arousal: Alert Behavior During Therapy: Anxious Cognition: Cognition impaired Difficult to assess due to: Hard of hearing/deaf Orientation impairments: Time Awareness: Online awareness impaired, Intellectual awareness intact Memory impairment (select all impairments): Short-term memory Attention impairment (select first level of impairment): Sustained attention Executive functioning impairment (select all impairments): Sequencing, Problem solving OT - Cognition Comments: Limited due to signficant anxiety and HOH                 Following commands: Impaired Following commands impaired: Follows one step commands with increased time, Follows one step commands inconsistently      Cueing   Cueing Techniques: Verbal cues, Visual cues, Gestural cues  Exercises      Shoulder Instructions       General Comments HR up to 123 with movement/anxiety/pain    Pertinent Vitals/ Pain       Pain Assessment Pain Assessment: Faces Faces Pain Scale: Hurts whole lot Pain Location: BLE with mobility, fear of pain as well Pain Descriptors /  Indicators: Stabbing Pain Intervention(s): Limited activity within patient's tolerance, Monitored during session, Repositioned  Home Living   Living Arrangements: Spouse/significant other Available Help at Discharge: Family;Available 24 hours/day Type of Home: House Home Access: Ramped entrance     Home Layout: Able to live on main level with bedroom/bathroom     Bathroom Shower/Tub: Chief Strategy Officer: Standard (has bedside commode) Bathroom Accessibility: Yes How Accessible: Accessible via walker        Lives With: Spouse    Prior Functioning/Environment              Frequency  Min 2X/week        Progress Toward Goals  OT Goals(current goals can now be found in the care plan section)  Progress towards OT goals: Progressing toward goals  Acute Rehab OT Goals Patient Stated Goal: to go home OT Goal Formulation: With patient/family Time For Goal Achievement: 08/16/23 Potential to Achieve Goals: Fair  Plan      Co-evaluation      Reason for Co-Treatment: Complexity of the patient's impairments (multi-system involvement);For patient/therapist safety;To address functional/ADL transfers PT goals addressed during session: Mobility/safety with mobility;Balance        AM-PAC OT "6 Clicks" Daily Activity     Outcome Measure   Help from another person eating meals?: Total Help from another person taking care of personal grooming?: Total Help from another person toileting, which includes using toliet, bedpan, or urinal?: Total Help from  another person bathing (including washing, rinsing, drying)?: Total Help from another person to put on and taking off regular upper body clothing?: Total Help from another person to put on and taking off regular lower body clothing?: Total 6 Click Score: 6    End of Session    OT Visit Diagnosis: Unsteadiness on feet (R26.81);Other abnormalities of gait and mobility (R26.89);Repeated falls (R29.6);Muscle  weakness (generalized) (M62.81);History of falling (Z91.81);Feeding difficulties (R63.3);Other symptoms and signs involving cognitive function;Adult, failure to thrive (R62.7);Pain   Activity Tolerance Patient limited by fatigue;Patient limited by lethargy;Patient limited by pain   Patient Left in bed;with call bell/phone within reach;with bed alarm set;with nursing/sitter in room;with family/visitor present   Nurse Communication Mobility status        Time: 1451-1531 OT Time Calculation (min): 40 min  Charges: OT General Charges $OT Visit: 1 Visit OT Treatments $Self Care/Home Management : 8-22 mins  Pollyann Glen E. Malachi Kinzler, OTR/L Acute Rehabilitation Services [phone]   Cherlyn Cushing 08/04/2023, 4:52 PM

## 2023-08-04 NOTE — Care Management Important Message (Signed)
 Important Message  Patient Details  Name: Peter Stone MRN: [id number] Date of Birth: 11-15-1940   Important Message Given:  Yes - Medicare IM     Dorena Bodo 08/04/2023, 2:54 PM

## 2023-08-04 NOTE — TOC Progression Note (Addendum)
 Transition of Care [facility]) - Progression Note    Patient Details  Name: Peter Stone MRN: [id number] Date of Birth: 24-Sep-1940  Transition of Care [facility]) CM/SW Contact  Alesia Richards, RN 08/04/2023, 12:27 PM  Clinical Narrative:     Noted, updated PT recommendations for Acute Rehab. CIR is actively reviewing for inpatient acute rehab. CM will continue to follow for discharge care planning needs. Patient is currently day 2 of IVIG treatment. CM will continue to follow for discharge care planning needs.   Noted, update per Leotis Shames, CIR Admissions, patient is approved to attend to CIR once medically ready. Per Leotis Shames, CIR Admissions, discharge plan reviewed with patient's wife. CIR will continue to follow.   Expected Discharge Plan: Skilled Nursing Facility Barriers to Discharge: Continued Medical Work up, English as a second language teacher, SNF Pending bed offer  Expected Discharge Plan and Services In-house Referral: Clinical Social Work     Living arrangements for the past 2 months: Single Family Home                   Social Determinants of Health (SDOH) Interventions SDOH Screenings   Food Insecurity: No Food Insecurity (08/01/2023)  Housing: Low Risk  (08/01/2023)  Transportation Needs: No Transportation Needs (08/01/2023)  Utilities: Not At Risk (08/01/2023)  Social Connections: Moderately Isolated (08/01/2023)  Tobacco Use: Low Risk  (08/01/2023)    Readmission Risk Interventions     No data to display

## 2023-08-04 NOTE — Progress Notes (Signed)
 RT obtained NIF and VC  Poor effort and quality patient had to repeatedly waken for attempts x4   NIF -30 FEV 0.3

## 2023-08-04 NOTE — Progress Notes (Signed)
 Speech Language Pathology Treatment: Dysphagia  Patient Details Name: Peter Stone MRN: [id number] DOB: Apr 03, 1941 Today's Date: 08/04/2023 Time: 4098-1191 SLP Time Calculation (min) (ACUTE ONLY): 11 min  Assessment / Plan / Recommendation Clinical Impression  Pt seen for dysphagia intervention with sitter present reporting decreased intake at breakfast. Pt confirmed he has had a decreased appetite and that "food doesn't have much taste." Encouraged him to order foods he desires and possibly have wife bring foods if this will make a difference. He denies difficulty swallowing thus far and consumed straw sips thin in beginning of session without s/s aspiration with hand over hand assist (right hand). After consumption of solid texture (graham cracker) he had one instance of wet vocal quality and spontaneous throat clear that cleared voice. He has intervals of confusion and stated he "was going to go home for awhile" when asked if he wanted to remain upright and sitter confirmed some confusion. Pt educated to clear throat if he notices a wet quality and sit upright for all meals. He is dependent for feeding will need to be fed slow, pt can assist with holding cup. Overall, he appeared to have safe and efficient swallow for majority of session and can continue regular texture, thin liquids, pills with thin (one at a time). No further ST needed.    HPI HPI: Peter Stone is an 83 yo male presenting to ED 4/1 with progressive LE weakness. Admitted due to concern for demyelinating disease, undergoing w/u. MRI Thoracic Spine shows contrast enhancement of visualized ventral nerve roots of the cauda equina, which can be seen in the setting of Guillain-Barre syndrome. LP pending. PMH includes COPD, HTN, HLD, hard of hearing      SLP Plan  All goals met;Discharge SLP treatment due to (comment)      Recommendations for follow up therapy are one component of a multi-disciplinary discharge planning  process, led by the attending physician.  Recommendations may be updated based on patient status, additional functional criteria and insurance authorization.    Recommendations  Diet recommendations: Regular;Thin liquid Liquids provided via: Cup;Straw Medication Administration: Whole meds with liquid (one at a time) Supervision: Staff to assist with self feeding Compensations: Minimize environmental distractions;Slow rate;Small sips/bites Postural Changes and/or Swallow Maneuvers: Seated upright 90 degrees                Rehab consult Oral care BID   Frequent or constant Supervision/Assistance Dysphagia, unspecified (R13.10)     All goals met;Discharge SLP treatment due to (comment)     Royce Macadamia  08/04/2023, 10:13 AM

## 2023-08-04 NOTE — Progress Notes (Signed)
  Echocardiogram 2D Echocardiogram has been performed.  Rosemary Holms, RDCS 08/04/2023, 9:52 AM

## 2023-08-04 NOTE — Progress Notes (Addendum)
 PROGRESS NOTE        PATIENT DETAILS Name: Peter Stone Age: 83 y.o. Sex: male Date of Birth: May 03, 1940 Admit Date: 08/01/2023 Admitting Physician Therisa Doyne, MD ZOX:[id number], Sherie Don, MD  Brief Summary: Patient is a 83 y.o.  male with history of COPD, HTN, HLD, hard of hearing-who presented with progressive lower extremity weakness-(ongoing since Christmas-started using a walker)-but progressively worse over the past several weeks to the point that he has stopped ambulating.  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  Significant studies: 3/28>> MRI C-spine: Cervical spondylosis/degenerative disc disease. 3/28>> MRI L-spine: Ventral nerve root enhancement and cauda equina. 4/01>> MRI T-spine: Contrast enhancement of visualized nerve roots in the cauda equina. 4/01>> abdominal x-ray: Large stool burden.  Significant microbiology data: 4/01>> COVID/influenza/RSV PCR: Negative 4/02> CSF culture: No growth 4/02>> CSF meningitis/encephalitis panel: Negative 4/2>> CSF VDRL: Nonreactive 4/2>> CSF VZV PCR: Negative  Procedures: 4/02>> LP  Consults: Neurology  Subjective: No delirium overnight-no complaints this morning-completely awake and alert.  Objective: Vitals: Blood pressure 107/70, pulse 87, temperature 98.1 F (36.7 C), resp. rate (!) 21, height 5\' 10"  (1.778 m), weight 63.3 kg, SpO2 99%.   Exam: Awake/alert Neurology: Unchanged exam this morning-still with unchanged distal bilateral upper extremity weakness and mostly distal lower extremity weakness.  No DTRs obtained.  Pertinent Labs/Radiology:    Latest Ref Rng & Units 08/04/2023    5:01 AM 08/02/2023    4:55 AM 08/01/2023    1:50 PM  CBC  WBC  4.0 - 10.5 K/uL 6.1  8.0  7.2   Hemoglobin 13.0 - 17.0 g/dL 81.1  91.4  78.2   Hematocrit 39.0 - 52.0 % 37.5  40.1  44.5   Platelets 150 - 400 K/uL 228  247  253     Lab Results  Component Value Date   NA 138 08/04/2023   K 3.3 (L) 08/04/2023   CL 106 08/04/2023   CO2 23 08/04/2023      Assessment/Plan: Progressive lower extremity>> upper extremity weakness CSF positive for albuminocytogenic dissociation-suspicion for CIDP Extensive CSF studies still pending IVIG x 5 days started 4/3 PT/OT CIR being contemplated Neurology following. NIF/VC being monitored by RT.  Delirium Delirium is on 4/2 No further delirium episodes overnight Continue melatonin and as needed Seroquel.  COPD Stable Bronchodilators  HTN BP stable on low-dose amlodipine Per H&P-patient not taking HCTZ/lisinopril.    Hypokalemia Replete/recheck.  Hard of hearing  Nutrition Status: Nutrition Problem: Severe Malnutrition Etiology: chronic illness (COPD and HTN exacerbated by acute event requiring hospitalization) Signs/Symptoms: severe fat depletion, severe muscle depletion, percent weight loss Interventions: Boost Plus, Magic cup, Refer to RD note for recommendations, MVI    Code status:   Code Status: Full Code   DVT Prophylaxis: enoxaparin (LOVENOX) injection 40 mg Start: 08/04/23 1115 SCDs Start: 08/01/23 2147   Family Communication:Spouse- Leia Alf [phone] -at bedside on 4/4   Disposition Plan: Status is: Inpatient Remains inpatient appropriate because: Severity of illness   Planned Discharge Destination:Home   Diet: Diet Order             Diet regular Room service appropriate? Yes with Assist; Fluid consistency: Thin  Diet effective now  Antimicrobial agents: Anti-infectives (From admission, onward)    None        MEDICATIONS: Scheduled Meds:  acetaminophen  650 mg Oral Q24H   amLODipine  5 mg Oral Daily   diphenhydrAMINE  25 mg  Intravenous Q24H   enoxaparin (LOVENOX) injection  40 mg Subcutaneous Q24H   lactose free nutrition  237 mL Oral TID WC   lidocaine  10 mL Intradermal Once   melatonin  5 mg Oral QHS   mirtazapine  7.5 mg Oral QHS   multivitamin with minerals  1 tablet Oral Daily   Continuous Infusions:  Immune Globulin 10% Stopped (08/03/23 1855)   PRN Meds:.acetaminophen **OR** acetaminophen, albuterol, HYDROcodone-acetaminophen, ondansetron **OR** ondansetron (ZOFRAN) IV, perflutren lipid microspheres (DEFINITY) IV suspension, QUEtiapine   I have personally reviewed following labs and imaging studies  LABORATORY DATA: CBC: Recent Labs  Lab 08/01/23 1350 08/02/23 0455 08/04/23 0501  WBC 7.2 8.0 6.1  NEUTROABS 4.4  --   --   HGB 14.7 13.4 12.9*  HCT 44.5 40.1 37.5*  MCV 96.1 94.8 93.5  PLT 253 247 228    Basic Metabolic Panel: Recent Labs  Lab 08/01/23 1350 08/02/23 0059 08/02/23 0455 08/04/23 0501  NA 139  --  140 138  K 4.4  --  3.8 3.3*  CL 106  --  110 106  CO2 22  --  22 23  GLUCOSE 104*  --  95 120*  BUN 40*  --  36* 34*  CREATININE 0.62  --  0.60* 0.56*  CALCIUM 9.1  --  8.9 8.5*  MG  --  2.1 2.0  --   PHOS  --  3.3 3.4  --     GFR: Estimated Creatinine Clearance: 60.4 mL/min (A) (by C-G formula based on SCr of 0.56 mg/dL (L)).  Liver Function Tests: Recent Labs  Lab 08/01/23 1350 08/02/23 0455 08/02/23 1259  AST 38 33  --   ALT 43 41  --   ALKPHOS 113 98  --   BILITOT 1.0 1.0  --   PROT 5.8* 4.5*  --   ALBUMIN 3.0* 2.6* 3.1*   No results for input(s): "LIPASE", "AMYLASE" in the last 168 hours. No results for input(s): "AMMONIA" in the last 168 hours.  Coagulation Profile: Recent Labs  Lab 08/02/23 0059  INR 1.0    Cardiac Enzymes: Recent Labs  Lab 08/02/23 0059  CKTOTAL 60    BNP (last 3 results) No results for input(s): "PROBNP" in the last 8760 hours.  Lipid Profile: No results for input(s): "CHOL", "HDL", "LDLCALC", "TRIG", "CHOLHDL",  "LDLDIRECT" in the last 72 hours.  Thyroid Function Tests: Recent Labs    08/02/23 0059  TSH 1.046    Anemia Panel: Recent Labs    08/01/23 1817  VITAMINB12 612  FOLATE >40.0    Urine analysis:    Component Value Date/Time   COLORURINE YELLOW 06/23/2023 1435   APPEARANCEUR CLEAR 06/23/2023 1435   LABSPEC 1.013 06/23/2023 1435   PHURINE 5.0 06/23/2023 1435   GLUCOSEU NEGATIVE 06/23/2023 1435   HGBUR NEGATIVE 06/23/2023 1435   BILIRUBINUR NEGATIVE 06/23/2023 1435   KETONESUR 20 (A) 06/23/2023 1435   PROTEINUR NEGATIVE 06/23/2023 1435   NITRITE NEGATIVE 06/23/2023 1435   LEUKOCYTESUR NEGATIVE 06/23/2023 1435    Sepsis Labs: Lactic Acid, Venous No results found for: "LATICACIDVEN"  MICROBIOLOGY: Recent Results (from the past 240 hours)  Resp panel by RT-PCR (RSV, Flu A&B, Covid) Anterior Nasal Swab     Status: None  Collection Time: 08/01/23  1:51 PM   Specimen: Anterior Nasal Swab  Result Value Ref Range Status   SARS Coronavirus 2 by RT PCR NEGATIVE NEGATIVE Final   Influenza A by PCR NEGATIVE NEGATIVE Final   Influenza B by PCR NEGATIVE NEGATIVE Final    Comment: (NOTE) The Xpert Xpress SARS-CoV-2/FLU/RSV plus assay is intended as an aid in the diagnosis of influenza from Nasopharyngeal swab specimens and should not be used as a sole basis for treatment. Nasal washings and aspirates are unacceptable for Xpert Xpress SARS-CoV-2/FLU/RSV testing.  Fact Sheet for Patients: BloggerCourse.com  Fact Sheet for Healthcare Providers: SeriousBroker.it  This test is not yet approved or cleared by the [place] FDA and has been authorized for detection and/or diagnosis of SARS-CoV-2 by FDA under an Emergency Use Authorization (EUA). This EUA will remain in effect (meaning this test can be used) for the duration of the COVID-19 declaration under Section 564(b)(1) of the Act, 21 U.S.C. section 360bbb-3(b)(1), unless  the authorization is terminated or revoked.     Resp Syncytial Virus by PCR NEGATIVE NEGATIVE Final    Comment: (NOTE) Fact Sheet for Patients: BloggerCourse.com  Fact Sheet for Healthcare Providers: SeriousBroker.it  This test is not yet approved or cleared by the [place] FDA and has been authorized for detection and/or diagnosis of SARS-CoV-2 by FDA under an Emergency Use Authorization (EUA). This EUA will remain in effect (meaning this test can be used) for the duration of the COVID-19 declaration under Section 564(b)(1) of the Act, 21 U.S.C. section 360bbb-3(b)(1), unless the authorization is terminated or revoked.  Performed at [facility], [address] [place], [place] [address]   VZV PCR, CSF     Status: None   Collection Time: 08/02/23 12:59 PM   Specimen: Lumbar Puncture; Cerebrospinal Fluid  Result Value Ref Range Status   VZV PCR, CSF Negative Negative Final    Comment: (NOTE) No Varicella Zoster Virus DNA detected. Performed At: [facility] [address] [place], [place] [id number] Jolene Schimke MD XB:[id number]   CSF culture w Gram Stain     Status: None (Preliminary result)   Collection Time: 08/02/23 12:59 PM   Specimen: PATH Cytology CSF; Cerebrospinal Fluid  Result Value Ref Range Status   Specimen Description CSF  Final   Special Requests NONE  Final   Gram Stain   Final    WBC PRESENT, PREDOMINANTLY MONONUCLEAR NO ORGANISMS SEEN CYTOSPIN SMEAR    Culture   Final    NO GROWTH < 24 HOURS Performed at [facility], [address], [place], [place] [address]    Report Status PENDING  Incomplete    RADIOLOGY STUDIES/RESULTS: ECHOCARDIOGRAM COMPLETE Result Date: 08/04/2023    ECHOCARDIOGRAM REPORT   Patient Name:   OWYN RAULSTON Date of Exam: 08/04/2023 Medical Rec #:  [id number]        Height:       70.0 in Accession #:    [id number]       Weight:       139.6 lb Date of  Birth:  January 22, 1941        BSA:          1.791 m Patient Age:    83 years         BP:           119/66 mmHg Patient Gender: M                HR:  92 bpm. Exam Location:  Inpatient Procedure: 2D Echo, Cardiac Doppler and Color Doppler (Both Spectral and Color            Flow Doppler were utilized during procedure). Indications:    R06.02 SOB  History:        Patient has no prior history of Echocardiogram examinations.                 COPD; Risk Factors:Hypertension.  Sonographer:    Maxwell Marion Referring Phys: Sharla Kidney Werner Lean Choice Kleinsasser IMPRESSIONS  1. Respirophasic septal motion which can been seen with COPD, obesity, and pericardial disease. Left ventricular ejection fraction, by estimation, is 55 to 60%. The left ventricle has normal function. The left ventricle has no regional wall motion abnormalities. Left ventricular diastolic parameters are consistent with Grade I diastolic dysfunction (impaired relaxation).  2. Right ventricular systolic function is normal. The right ventricular size is normal.  3. The mitral valve is normal in structure. No evidence of mitral valve regurgitation. No evidence of mitral stenosis. Moderate mitral annular calcification.  4. The aortic valve is normal in structure. Aortic valve regurgitation is not visualized. No aortic stenosis is present.  5. The inferior vena cava is normal in size with greater than 50% respiratory variability, suggesting right atrial pressure of 3 mmHg. FINDINGS  Left Ventricle: Respirophasic septal motion which can been seen with COPD, obesity, and pericardial disease. Left ventricular ejection fraction, by estimation, is 55 to 60%. The left ventricle has normal function. The left ventricle has no regional wall  motion abnormalities. Definity contrast agent was given IV to delineate the left ventricular endocardial borders. The left ventricular internal cavity size was normal in size. There is no left ventricular hypertrophy. Left ventricular diastolic  parameters are consistent with Grade I diastolic dysfunction (impaired relaxation). Right Ventricle: The right ventricular size is normal. No increase in right ventricular wall thickness. Right ventricular systolic function is normal. Left Atrium: Left atrial size was normal in size. Right Atrium: Right atrial size was normal in size. Pericardium: There is no evidence of pericardial effusion. Mitral Valve: The mitral valve is normal in structure. Moderate mitral annular calcification. No evidence of mitral valve regurgitation. No evidence of mitral valve stenosis. Tricuspid Valve: The tricuspid valve is normal in structure. Tricuspid valve regurgitation is not demonstrated. No evidence of tricuspid stenosis. Aortic Valve: The aortic valve is normal in structure. Aortic valve regurgitation is not visualized. No aortic stenosis is present. Pulmonic Valve: The pulmonic valve was normal in structure. Pulmonic valve regurgitation is not visualized. No evidence of pulmonic stenosis. Aorta: The aortic root is normal in size and structure. Venous: The inferior vena cava is normal in size with greater than 50% respiratory variability, suggesting right atrial pressure of 3 mmHg. IAS/Shunts: No atrial level shunt detected by color flow Doppler. Clearnce Hasten Electronically signed by Clearnce Hasten Signature Date/Time: 08/04/2023/9:55:47 AM    Final    DG FL GUIDED LUMBAR PUNCTURE Result Date: 08/02/2023 CLINICAL DATA:  83 year old male with progressive weakness of bilateral lower extremities for the past few months following a viral infection in December. Now with weakness in bilateral upper extremities; concerning for Guillain-Barrre syndrome. Request for lumbar puncture. EXAM: LUMBAR PUNCTURE UNDER FLUOROSCOPY PROCEDURE: An appropriate skin entry site was determined fluoroscopically. Operator donned sterile gloves and mask. Skin site was marked, then prepped with Betadine, draped in usual sterile fashion, and infiltrated  locally with 1% lidocaine. A 20 gauge spinal needle advanced into the thecal sac at  L4-5 from a right interlaminar approach. Clear colorless CSF spontaneously returned, with opening pressure of 20 cm water. 10 ml CSF were collected and divided among 4 sterile vials for the requested laboratory studies. The needle was then removed. The patient tolerated the procedure well and there were no complications. FLUOROSCOPY: Radiation Exposure Index (as provided by the fluoroscopic device): 8.8 mGy Kerma IMPRESSION: Technically successful lumbar puncture under fluoroscopy. This exam was performed by Sherlene Shams, PA-C, and was supervised and interpreted by Dr. Mosie Epstein. Electronically Signed   By: Gilmer Mor D.O.   On: 08/02/2023 14:00     LOS: 3 days   Jeoffrey Massed, MD  [facility]    To contact the attending provider between 7A-7P or the covering provider during after hours 7P-7A, please log into the web site [email or web address] and access using universal [place] password for that web site. If you do not have the password, please call the hospital operator.  08/04/2023, 10:23 AM

## 2023-08-04 NOTE — Progress Notes (Signed)
 Physical Therapy Treatment Patient Details Name: Peter Stone MRN: [id number] DOB: 12/01/1940 Today's Date: 08/04/2023   History of Present Illness Patient is an 83 yo male presenting to the ED with progressive weakness on 08/01/23. MRI of the lumbar spine showing enhancement of nerve roots concerning for inflammatory versus infectious versus low suspicion neoplastic process. Lumbar puncture on 08/02/23 positive for albuminocytogenic dissociation, pt being worked up for GBS. PMH includes: COPD, HTN, HLD, HOH, OSA.    PT Comments  Pt seen for PT tx with pt agreeable with wife present for encouragement. Pt is very anxious of falling & of pain with movement;  pt with c/o pain in BLE with minimal movement, reports pain is "stabbing". Pt requires max<>total assist +2 for supine<>sit & max assist to sit EOB. Pt attempted to shift trunk anteriorly to decrease posterior lean & pt putting forth visible effort but little movement noted. OT assisted pt with washing face while sitting EOB.  Pt tolerated sitting EOB ~12 minutes. Will continue to follow pt acutely to progress mobility as able.   If plan is discharge home, recommend the following: Two people to help with walking and/or transfers;Two people to help with bathing/dressing/bathroom;Assistance with feeding   Can travel by private vehicle     No  Equipment Recommendations  Wheelchair (measurements PT);Hoyer lift;Hospital bed;Wheelchair cushion (measurements PT)    Recommendations for Other Services Rehab consult     Precautions / Restrictions Precautions Precautions: Fall Recall of Precautions/Restrictions: Intact Precaution/Restrictions Comments: fear of falling Restrictions Weight Bearing Restrictions Per Provider Order: No     Mobility  Bed Mobility Overal bed mobility: Needs Assistance Bed Mobility: Supine to Sit, Sit to Supine     Supine to sit: Max assist, Total assist, +2 for safety/equipment, +2 for physical assistance, HOB  elevated, Used rails Sit to supine: Max assist, Total assist, +2 for physical assistance, +2 for safety/equipment   General bed mobility comments: Pt agreeable to attempting to move BLE to EOB, requires max assist to do so. Max<>total assist +2 for supine<>sit.    Transfers                        Ambulation/Gait                   Stairs             Wheelchair Mobility     Tilt Bed    Modified Rankin (Stroke Patients Only)       Balance   Sitting-balance support: Bilateral upper extremity supported, Feet supported Sitting balance-Leahy Scale: Poor   Postural control: Posterior lean                                  Communication Communication Communication: Impaired Factors Affecting Communication: Hearing impaired (hearing aides)  Cognition Arousal: Alert Behavior During Therapy: Anxious                           PT - Cognition Comments: extremely fearful of falling with all mobility, even voices fear of falling when lying flat in bed Following commands: Impaired Following commands impaired: Follows one step commands with increased time, Follows one step commands inconsistently    Cueing Cueing Techniques: Verbal cues, Visual cues, Gestural cues  Exercises      General Comments        Pertinent Vitals/Pain Pain  Assessment Pain Assessment: Faces Faces Pain Scale: Hurts whole lot Pain Location: BLE with mobility, fear of pain as well Pain Descriptors / Indicators: Stabbing Pain Intervention(s): Monitored during session, Limited activity within patient's tolerance, Repositioned, Utilized relaxation techniques    Home Living   Living Arrangements: Spouse/significant other Available Help at Discharge: Family;Available 24 hours/day Type of Home: House Home Access: Ramped entrance       Home Layout: Able to live on main level with bedroom/bathroom        Prior Function            PT Goals (current  goals can now be found in the care plan section) Acute Rehab PT Goals Patient Stated Goal: Get stronger PT Goal Formulation: With patient Time For Goal Achievement: 08/16/23 Potential to Achieve Goals: Poor Progress towards PT goals: Progressing toward goals    Frequency    Min 2X/week      PT Plan      Co-evaluation PT/OT/SLP Co-Evaluation/Treatment: Yes Reason for Co-Treatment: Complexity of the patient's impairments (multi-system involvement);For patient/therapist safety;To address functional/ADL transfers PT goals addressed during session: Mobility/safety with mobility;Balance        AM-PAC PT "6 Clicks" Mobility   Outcome Measure  Help needed turning from your back to your side while in a flat bed without using bedrails?: Total Help needed moving from lying on your back to sitting on the side of a flat bed without using bedrails?: Total Help needed moving to and from a bed to a chair (including a wheelchair)?: Total Help needed standing up from a chair using your arms (e.g., wheelchair or bedside chair)?: Total Help needed to walk in hospital room?: Total Help needed climbing 3-5 steps with a railing? : Total 6 Click Score: 6    End of Session   Activity Tolerance: Patient limited by pain Patient left: in bed;with call bell/phone within reach;with bed alarm set;with family/visitor present;with nursing/sitter in room Nurse Communication: Mobility status PT Visit Diagnosis: Muscle weakness (generalized) (M62.81);Other symptoms and signs involving the nervous system (R29.898);History of falling (Z91.81);Unsteadiness on feet (R26.81);Other abnormalities of gait and mobility (R26.89);Pain;Difficulty in walking, not elsewhere classified (R26.2) Pain - Right/Left:  (bilateral) Pain - part of body: Leg     Time: 1451-1531 PT Time Calculation (min) (ACUTE ONLY): 40 min  Charges:    $Therapeutic Activity: 23-37 mins PT General Charges $$ ACUTE PT VISIT: 1 Visit                      Peter Stone, PT, DPT 08/04/23, 3:51 PM    Sandi Mariscal 08/04/2023, 3:51 PM

## 2023-08-04 NOTE — Progress Notes (Addendum)
 Inpatient Rehab Admissions:  Inpatient Rehab Consult received.  I met with patient and his wife Peter Stone at the bedside for rehabilitation assessment and to discuss goals and expectations of an inpatient rehab admission.  Discussed average length of stay, insurance authorization requirement and discharge home after completion of CIR. Peter Stone acknowledged understanding. She is supportive of pt pursuing CIR. She confirmed that she will be able to provide 24/7 support for pt after discharge. Will continue to follow.  ADDENDUM: Made aware that therapy has changed recommendations to SNF. Will continue to monitor pt's participation and progress with therapy with completion of IVIG on Monday. Pt's wife aware of ACs plan.  Signed: Wolfgang Phoenix, MS, CCC-SLP Admissions Coordinator [phone]

## 2023-08-04 NOTE — Progress Notes (Addendum)
 NEUROLOGY CONSULT FOLLOW UP NOTE   Date of service: August 04, 2023 Patient Name: Peter Stone MRN:  [id number] DOB:  02/24/1941  Interval Hx/subjective   No family at the bedside.  Patient is sitting up in the bed in no apparent distress.  Echo tech is at the bed to begin his echo  He states he is feeling okay at his weakness remains the same He had his first dose of IVIG yesterday today will be day 2 of 5  Vitals   Vitals:   08/04/23 0155 08/04/23 0257 08/04/23 0700 08/04/23 0804  BP:   119/66 107/70  Pulse: (!) 127 87 87   Resp: (!) 23 15 (!) 21   Temp:      TempSrc:      SpO2: 98% 98% 99%   Weight:      Height:         Body mass index is 20.02 kg/m.  Physical Exam   Constitutional: Frail elderly male in no apparent distress Psych: Flat affect Eyes: No scleral injection.  HENT: No OP obstrucion.  Head: Normocephalic.  Cardiovascular: Normal rate and regular rhythm.  Respiratory: Effort normal, non-labored breathing.  GI: Soft.  No distension. There is no tenderness.  Skin: WDI.   Neurologic Examination   Neuro: Mental Status: Patient is awake, alert, oriented to person, place, month, year, and situation.  Mild weakness with talking Cranial Nerves: II-XII grossly intact, including strong shoulder shrug bilaterally and hard of hearing at baseline. Motor: Tone is normal. Bulk is normal.  On upper extremities, the left side is stronger                                                   Right                Left Shoulder Abduction                5/5                   5/5 Elbow Flexion                          3/5                   4/5 Elbow Extension                     2/5                   3/5 Wrist Flexion                           2/5                   3/5 Wrist Extension                       3/5                   4/5 Grip                                         2/5  3-4/5   Hip Flexion                              3/5                    3/5 Knee Flexion                           3/5                  2/5 Knee Extension                       3/5                   3/5 Ankle Dorsiflexion                   2/5                  0/5 Ankle Plantarflexion                2/5                  0/5   He is unable to lift his RUE off the bed at all, LUE can be lifted off bed slightly but can not withstand gravity for to long .  DTR: absent throughout     Medications  Current Facility-Administered Medications:    acetaminophen (TYLENOL) tablet 650 mg, 650 mg, Oral, Q6H PRN **OR** acetaminophen (TYLENOL) suppository 650 mg, 650 mg, Rectal, Q6H PRN, Doutova, Anastassia, MD   acetaminophen (TYLENOL) tablet 650 mg, 650 mg, Oral, Q24H, Alverna Fawley, MD, 650 mg at 08/04/23 0805   albuterol (PROVENTIL) (2.5 MG/3ML) 0.083% nebulizer solution 2.5 mg, 2.5 mg, Nebulization, Q2H PRN, Doutova, Anastassia, MD   amLODipine (NORVASC) tablet 5 mg, 5 mg, Oral, Daily, Ghimire, Shanker M, MD, 5 mg at 08/03/23 1004   diphenhydrAMINE (BENADRYL) injection 25 mg, 25 mg, Intravenous, Q24H, Joylyn Duggin, MD, 25 mg at 08/03/23 1638   HYDROcodone-acetaminophen (NORCO/VICODIN) 5-325 MG per tablet 1-2 tablet, 1-2 tablet, Oral, Q4H PRN, Doutova, Anastassia, MD   Immune Globulin 10% (PRIVIGEN) IV infusion 25 g, 400 mg/kg, Intravenous, Q24 Hr x 5, Cyleigh Massaro, MD, Stopped at 08/03/23 1855   lactose free nutrition (BOOST PLUS) liquid 237 mL, 237 mL, Oral, TID WC, Ghimire, Shanker M, MD, 237 mL at 08/04/23 0808   lidocaine (XYLOCAINE) 2 % (with pres) injection 200 mg, 10 mL, Intradermal, Once, Doutova, Anastassia, MD   melatonin tablet 5 mg, 5 mg, Oral, QHS, Ghimire, Shanker M, MD, 5 mg at 08/03/23 2225   mirtazapine (REMERON) tablet 7.5 mg, 7.5 mg, Oral, QHS, Ghimire, Shanker M, MD, 7.5 mg at 08/03/23 2225   multivitamin with minerals tablet 1 tablet, 1 tablet, Oral, Daily, Ghimire, Werner Lean, MD, 1 tablet at 08/04/23 0804   ondansetron (ZOFRAN)  tablet 4 mg, 4 mg, Oral, Q6H PRN **OR** ondansetron (ZOFRAN) injection 4 mg, 4 mg, Intravenous, Q6H PRN, Doutova, Anastassia, MD   QUEtiapine (SEROQUEL) tablet 25 mg, 25 mg, Oral, Daily PRN, Maretta Bees, MD, 25 mg at 08/03/23 2226  Labs and Diagnostic Imaging   CBC:  Recent Labs  Lab 08/01/23 1350 08/02/23 0455 08/04/23 0501  WBC 7.2 8.0 6.1  NEUTROABS 4.4  --   --   HGB 14.7 13.4 12.9*  HCT  44.5 40.1 37.5*  MCV 96.1 94.8 93.5  PLT 253 247 228    Basic Metabolic Panel:  Lab Results  Component Value Date   NA 138 08/04/2023   K 3.3 (L) 08/04/2023   CO2 23 08/04/2023   GLUCOSE 120 (H) 08/04/2023   BUN 34 (H) 08/04/2023   CREATININE 0.56 (L) 08/04/2023   CALCIUM 8.5 (L) 08/04/2023   GFRNONAA >60 08/04/2023   GFRAA  07/10/2008    >60        The eGFR has been calculated using the MDRD equation. This calculation has not been validated in all clinical situations. eGFR's persistently <60 mL/min signify possible Chronic Kidney Disease.   Lipid Panel: No results found for: "LDLCALC" HgbA1c: No results found for: "HGBA1C" Urine Drug Screen: No results found for: "LABOPIA", "COCAINSCRNUR", "LABBENZ", "AMPHETMU", "THCU", "LABBARB"  Alcohol Level No results found for: "ETH" INR  Lab Results  Component Value Date   INR 1.0 08/02/2023   APTT No results found for: "APTT" AED levels: No results found for: "PHENYTOIN", "ZONISAMIDE", "LAMOTRIGINE", "LEVETIRACETA"  MRI Brain(Personally reviewed): No acute intracranial abnormality. Findings of chronic small vessel ischemia.   MRI L/T/C Spine: Substantial abnormal ventral nerve root enhancement in the cauda equina, with some low-level leptomeningeal enhancement along the anterior margin of the conus. Guillain-Barre syndrome is the most likely cause of this pattern. Small central disc protrusion at T7-8 without spinal canal stenosis. Cervical spondylosis and degenerative disc disease causing prominent impingement at C3-4  and C4-5; mild to moderate impingement at C2-3; and mild impingement at C6-7   LP 4/2: CSF Culture: Prelim negative VDRL: non reactive  Cell count: RBC 1, WBC 2 IgG CSF: 8.4 M/E Panel: Negative OG Bands: Pending Protein: 93 Glucose: 64 VZV PCR: Negative   Lyme Disease, GQ1b Antibody (IgG), EIA  Pending      Assessment   Peter Stone is a 83 y.o. male with PMH significant for HTN, OSA, HLD who presented 4/1 with progressive generalized weakness, proximal worse than distal, now developing BUE weakness and bulbar symptoms. 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 MRI L spine, however, also showed evidence of contrast enhancement seen in GBS.    Chart review does show decreased PO intake after a fall in February. He was seen by outpatient neurology for the progressive weakness on 3/21 where wife noted him slowing down over the last few years. Patient had a more acute decline after a viral illness in December 2024, with inability to stand a couple of weeks after that. PT was ordered. Afib was noted by therapist, which is a new diagnosis for patient. He is scheduled for OP cardiology 4/14. EKGs this admission have shown SR with RBB and prolonged QT.    LP was completed under fluoro    Albumin is low, as expected with his recent history of low PO intake. This, of course, can contribute even further to the weakness seen in his likely diagnosis of GBS. Patient also takes remeron, which has a possible side effect of weakness and fatigue. However, patient just started on this medication 3/28.  Recommendations  Will continue to follow CSF labs  Correct metabolic derangements per primary team Continue IVIG 2 g/KG daily for total of 5 days, day #2/5 Continue premedication with Benadryl and tylenol with IVIG  PT/OT/SP  Neurology will continue to follow  ____________________________________________________________________   Signed, Mathews Argyle, NP [facility]  I personally  evaluated patient and agree with the assessment and plan above with the following addition.  Spoke with Dr. Loleta Chance about choice of therapy regarding possible CIDP given albuminocytologic dissociation seen on CSF and timing of symptoms. Continue IVIG 2 g/kg divided over 5 days.    Anibal Henderson, MD [facility]

## 2023-08-05 DIAGNOSIS — I10 Essential (primary) hypertension: Secondary | ICD-10-CM | POA: Diagnosis not present

## 2023-08-05 DIAGNOSIS — J42 Unspecified chronic bronchitis: Secondary | ICD-10-CM | POA: Diagnosis not present

## 2023-08-05 DIAGNOSIS — G610 Guillain-Barre syndrome: Secondary | ICD-10-CM | POA: Diagnosis not present

## 2023-08-05 LAB — BASIC METABOLIC PANEL WITH GFR
Anion gap: 6 (ref 5–15)
BUN: 28 mg/dL — ABNORMAL HIGH (ref 8–23)
CO2: 24 mmol/L (ref 22–32)
Calcium: 8.8 mg/dL — ABNORMAL LOW (ref 8.9–10.3)
Chloride: 110 mmol/L (ref 98–111)
Creatinine, Ser: 0.59 mg/dL — ABNORMAL LOW (ref 0.61–1.24)
GFR, Estimated: >60 mL/min (ref >60)
Glucose, Bld: 115 mg/dL — ABNORMAL HIGH (ref 70–99)
Potassium: 3.6 mmol/L (ref 3.5–5.1)
Sodium: 140 mmol/L (ref 135–145)

## 2023-08-05 LAB — CBC
HCT: 36.3 % — ABNORMAL LOW (ref 39.0–52.0)
Hemoglobin: 12.5 g/dL — ABNORMAL LOW (ref 13.0–17.0)
MCH: 32.5 pg (ref 26.0–34.0)
MCHC: 34.4 g/dL (ref 30.0–36.0)
MCV: 94.3 fL (ref 80.0–100.0)
Platelets: 217 10*3/uL (ref 150–400)
RBC: 3.85 MIL/uL — ABNORMAL LOW (ref 4.22–5.81)
RDW: 15.5 % (ref 11.5–15.5)
WBC: 6 10*3/uL (ref 4.0–10.5)
nRBC: 0 % (ref 0.0–0.2)

## 2023-08-05 LAB — CSF CULTURE W GRAM STAIN: Culture: NO GROWTH

## 2023-08-05 NOTE — Progress Notes (Signed)
 PROGRESS NOTE        PATIENT DETAILS Name: Peter Stone Age: 83 y.o. Sex: male Date of Birth: 06-27-1940 Admit Date: 08/01/2023 Admitting Physician Therisa Doyne, MD [id number], Sherie Don, MD  Brief Summary: Patient is a 83 y.o.  male with history of COPD, HTN, HLD, hard of hearing-who presented with progressive lower extremity weakness-(ongoing since Christmas-started using a walker)-but progressively worse over the past several weeks to the point that he has stopped ambulating.  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  Significant studies: 3/28>> MRI C-spine: Cervical spondylosis/degenerative disc disease. 3/28>> MRI L-spine: Ventral nerve root enhancement and cauda equina. 4/01>> MRI T-spine: Contrast enhancement of visualized nerve roots in the cauda equina. 4/01>> abdominal x-ray: Large stool burden.  Significant microbiology data: 4/01>> COVID/influenza/RSV PCR: Negative 4/02> CSF culture: No growth 4/02>> CSF meningitis/encephalitis panel: Negative 4/2>> CSF VDRL: Nonreactive 4/2>> CSF VZV PCR: Negative  Procedures: 4/02>> LP  Consults: Neurology  Subjective: Awake/alert-no major issues overnight.  Objective: Vitals: Blood pressure (!) 157/96, pulse 100, temperature 98.3 F (36.8 C), temperature source Oral, resp. rate (!) 25, height 5\' 10"  (1.778 m), weight 63.3 kg, SpO2 95%.   Exam: Awake/alert this morning Not in any distress Unchanged exam-continues to have significant weakness mostly in distal lower extremities-and also in distal upper extremities. DTR unable to be elicited.  Pertinent Labs/Radiology:    Latest Ref Rng & Units 08/05/2023    6:02 AM 08/04/2023    5:01 AM 08/02/2023    4:55  AM  CBC  WBC 4.0 - 10.5 K/uL 6.0  6.1  8.0   Hemoglobin 13.0 - 17.0 g/dL 84.6  96.2  95.2   Hematocrit 39.0 - 52.0 % 36.3  37.5  40.1   Platelets 150 - 400 K/uL 217  228  247     Lab Results  Component Value Date   NA 140 08/05/2023   K 3.6 08/05/2023   CL 110 08/05/2023   CO2 24 08/05/2023      Assessment/Plan: Progressive lower extremity>> upper extremity weakness CSF positive for albuminocytogenic dissociation-suspicion for CIDP Extensive CSF studies still pending IVIG x 5 days started 4/3 PT/OT CIR being contemplated Neurology following. NIF/VC being monitored by RT.  Delirium Delirium is on 4/2 No further delirium episodes overnight Delirium precautions As needed Seroquel Doing well on melatonin/Remeron.  COPD Stable Bronchodilators  HTN BP stable on low-dose amlodipine Per H&P-patient not taking HCTZ/lisinopril.    BPH As needed bladder scans Flomax  Hypokalemia Repleted  Hard of hearing  Failure to thrive/debility/poor appetite Poor appetite but per my conversation with spouse on 4/4-eating a bit more every day Continue Remeron-encourage oral intake-and reassess on 4/7-if oral intake is still poor-May need to consider temporary cortrak placement  Nutrition Status: Nutrition Problem: Severe Malnutrition Etiology: chronic illness (COPD and HTN exacerbated by acute event requiring hospitalization) Signs/Symptoms: severe fat depletion, severe muscle depletion, percent weight loss Interventions: Boost Plus, Magic cup, Refer to RD note for recommendations, MVI    Code status:   Code Status: Full Code   DVT Prophylaxis: enoxaparin (LOVENOX) injection 40 mg Start: 08/04/23 1115 SCDs Start: 08/01/23 2147   Family Communication:Spouse- Peter Stone [phone] -at bedside on 4/4  Disposition Plan: Status is: Inpatient Remains inpatient appropriate because: Severity of illness   Planned Discharge Destination:Home   Diet: Diet Order              Diet regular Room service appropriate? Yes with Assist; Fluid consistency: Thin  Diet effective now                     Antimicrobial agents: Anti-infectives (From admission, onward)    None        MEDICATIONS: Scheduled Meds:  acetaminophen  650 mg Oral Q24H   amLODipine  5 mg Oral Daily   diphenhydrAMINE  25 mg Intravenous Q24H   enoxaparin (LOVENOX) injection  40 mg Subcutaneous Q24H   lactose free nutrition  237 mL Oral TID WC   lidocaine  10 mL Intradermal Once   melatonin  5 mg Oral QHS   mirtazapine  7.5 mg Oral QHS   multivitamin with minerals  1 tablet Oral Daily   tamsulosin  0.4 mg Oral Daily   Continuous Infusions:  Immune Globulin 10% Stopped (08/04/23 1801)   PRN Meds:.acetaminophen **OR** acetaminophen, albuterol, HYDROcodone-acetaminophen, ondansetron **OR** ondansetron (ZOFRAN) IV, QUEtiapine   I have personally reviewed following labs and imaging studies  LABORATORY DATA: CBC: Recent Labs  Lab 08/01/23 1350 08/02/23 0455 08/04/23 0501 08/05/23 0602  WBC 7.2 8.0 6.1 6.0  NEUTROABS 4.4  --   --   --   HGB 14.7 13.4 12.9* 12.5*  HCT 44.5 40.1 37.5* 36.3*  MCV 96.1 94.8 93.5 94.3  PLT 253 247 228 217    Basic Metabolic Panel: Recent Labs  Lab 08/01/23 1350 08/02/23 0059 08/02/23 0455 08/04/23 0501 08/05/23 0602  NA 139  --  140 138 140  K 4.4  --  3.8 3.3* 3.6  CL 106  --  110 106 110  CO2 22  --  22 23 24   GLUCOSE 104*  --  95 120* 115*  BUN 40*  --  36* 34* 28*  CREATININE 0.62  --  0.60* 0.56* 0.59*  CALCIUM 9.1  --  8.9 8.5* 8.8*  MG  --  2.1 2.0  --   --   PHOS  --  3.3 3.4  --   --     GFR: Estimated Creatinine Clearance: 60.4 mL/min (A) (by C-G formula based on SCr of 0.59 mg/dL (L)).  Liver Function Tests: Recent Labs  Lab 08/01/23 1350 08/02/23 0455 08/02/23 1259  AST 38 33  --   ALT 43 41  --   ALKPHOS 113 98  --   BILITOT 1.0 1.0  --   PROT 5.8* 4.5*  --   ALBUMIN 3.0* 2.6* 3.1*   No results for  input(s): "LIPASE", "AMYLASE" in the last 168 hours. No results for input(s): "AMMONIA" in the last 168 hours.  Coagulation Profile: Recent Labs  Lab 08/02/23 0059  INR 1.0    Cardiac Enzymes: Recent Labs  Lab 08/02/23 0059  CKTOTAL 60    BNP (last 3 results) No results for input(s): "PROBNP" in the last 8760 hours.  Lipid Profile: No results for input(s): "CHOL", "HDL", "LDLCALC", "TRIG", "CHOLHDL", "LDLDIRECT" in the last 72 hours.  Thyroid Function Tests: No results for input(s): "TSH", "T4TOTAL", "FREET4", "T3FREE", "THYROIDAB" in the last 72 hours.   Anemia Panel: No results for input(s): "VITAMINB12", "FOLATE", "FERRITIN", "TIBC", "IRON", "RETICCTPCT" in the last 72 hours.   Urine analysis:    Component Value Date/Time   COLORURINE YELLOW 06/23/2023 1435  APPEARANCEUR CLEAR 06/23/2023 1435   LABSPEC 1.013 06/23/2023 1435   PHURINE 5.0 06/23/2023 1435   GLUCOSEU NEGATIVE 06/23/2023 1435   HGBUR NEGATIVE 06/23/2023 1435   BILIRUBINUR NEGATIVE 06/23/2023 1435   KETONESUR 20 (A) 06/23/2023 1435   PROTEINUR NEGATIVE 06/23/2023 1435   NITRITE NEGATIVE 06/23/2023 1435   LEUKOCYTESUR NEGATIVE 06/23/2023 1435    Sepsis Labs: Lactic Acid, Venous No results found for: "LATICACIDVEN"  MICROBIOLOGY: Recent Results (from the past 240 hours)  Resp panel by RT-PCR (RSV, Flu A&B, Covid) Anterior Nasal Swab     Status: None   Collection Time: 08/01/23  1:51 PM   Specimen: Anterior Nasal Swab  Result Value Ref Range Status   SARS Coronavirus 2 by RT PCR NEGATIVE NEGATIVE Final   Influenza A by PCR NEGATIVE NEGATIVE Final   Influenza B by PCR NEGATIVE NEGATIVE Final    Comment: (NOTE) The Xpert Xpress SARS-CoV-2/FLU/RSV plus assay is intended as an aid in the diagnosis of influenza from Nasopharyngeal swab specimens and should not be used as a sole basis for treatment. Nasal washings and aspirates are unacceptable for Xpert Xpress  SARS-CoV-2/FLU/RSV testing.  Fact Sheet for Patients: BloggerCourse.com  Fact Sheet for Healthcare Providers: SeriousBroker.it  This test is not yet approved or cleared by the [place] FDA and has been authorized for detection and/or diagnosis of SARS-CoV-2 by FDA under an Emergency Use Authorization (EUA). This EUA will remain in effect (meaning this test can be used) for the duration of the COVID-19 declaration under Section 564(b)(1) of the Act, 21 U.S.C. section 360bbb-3(b)(1), unless the authorization is terminated or revoked.     Resp Syncytial Virus by PCR NEGATIVE NEGATIVE Final    Comment: (NOTE) Fact Sheet for Patients: BloggerCourse.com  Fact Sheet for Healthcare Providers: SeriousBroker.it  This test is not yet approved or cleared by the [place] FDA and has been authorized for detection and/or diagnosis of SARS-CoV-2 by FDA under an Emergency Use Authorization (EUA). This EUA will remain in effect (meaning this test can be used) for the duration of the COVID-19 declaration under Section 564(b)(1) of the Act, 21 U.S.C. section 360bbb-3(b)(1), unless the authorization is terminated or revoked.  Performed at [facility], [address], [place], [place] [address]   VZV PCR, CSF     Status: None   Collection Time: 08/02/23 12:59 PM   Specimen: Lumbar Puncture; Cerebrospinal Fluid  Result Value Ref Range Status   VZV PCR, CSF Negative Negative Final    Comment: (NOTE) No Varicella Zoster Virus DNA detected. Performed At: [facility] [address] [place], [place] [id number] Jolene Schimke MD [id number]   CSF culture w Gram Stain     Status: None   Collection Time: 08/02/23 12:59 PM   Specimen: PATH Cytology CSF; Cerebrospinal Fluid  Result Value Ref Range Status   Specimen Description CSF  Final   Special Requests NONE  Final    Gram Stain   Final    WBC PRESENT, PREDOMINANTLY MONONUCLEAR NO ORGANISMS SEEN CYTOSPIN SMEAR    Culture   Final    NO GROWTH 3 DAYS Performed at [facility], [address] [place], [place] [address]    Report Status 08/05/2023 FINAL  Final    RADIOLOGY STUDIES/RESULTS: ECHOCARDIOGRAM COMPLETE Result Date: 08/04/2023    ECHOCARDIOGRAM REPORT   Patient Name:   Peter Stone Date of Exam: 08/04/2023 Medical Rec #:  [id number]        Height:  70.0 in Accession #:    [id number]       Weight:       139.6 lb Date of Birth:  [birth year]        BSA:          1.791 m Patient Age:    83 years         BP:           119/66 mmHg Patient Gender: M                HR:           92 bpm. Exam Location:  Inpatient Procedure: 2D Echo, Cardiac Doppler and Color Doppler (Both Spectral and Color            Flow Doppler were utilized during procedure). Indications:    R06.02 SOB  History:        Patient has no prior history of Echocardiogram examinations.                 COPD; Risk Factors:Hypertension.  Sonographer:    Maxwell Marion Referring Phys: Sharla Kidney Werner Lean Ranon Coven IMPRESSIONS  1. Respirophasic septal motion which can been seen with COPD, obesity, and pericardial disease. Left ventricular ejection fraction, by estimation, is 55 to 60%. The left ventricle has normal function. The left ventricle has no regional wall motion abnormalities. Left ventricular diastolic parameters are consistent with Grade I diastolic dysfunction (impaired relaxation).  2. Right ventricular systolic function is normal. The right ventricular size is normal.  3. The mitral valve is normal in structure. No evidence of mitral valve regurgitation. No evidence of mitral stenosis. Moderate mitral annular calcification.  4. The aortic valve is normal in structure. Aortic valve regurgitation is not visualized. No aortic stenosis is present.  5. The inferior vena cava is normal in size with greater than 50% respiratory variability, suggesting  right atrial pressure of 3 mmHg. FINDINGS  Left Ventricle: Respirophasic septal motion which can been seen with COPD, obesity, and pericardial disease. Left ventricular ejection fraction, by estimation, is 55 to 60%. The left ventricle has normal function. The left ventricle has no regional wall  motion abnormalities. Definity contrast agent was given IV to delineate the left ventricular endocardial borders. The left ventricular internal cavity size was normal in size. There is no left ventricular hypertrophy. Left ventricular diastolic parameters are consistent with Grade I diastolic dysfunction (impaired relaxation). Right Ventricle: The right ventricular size is normal. No increase in right ventricular wall thickness. Right ventricular systolic function is normal. Left Atrium: Left atrial size was normal in size. Right Atrium: Right atrial size was normal in size. Pericardium: There is no evidence of pericardial effusion. Mitral Valve: The mitral valve is normal in structure. Moderate mitral annular calcification. No evidence of mitral valve regurgitation. No evidence of mitral valve stenosis. Tricuspid Valve: The tricuspid valve is normal in structure. Tricuspid valve regurgitation is not demonstrated. No evidence of tricuspid stenosis. Aortic Valve: The aortic valve is normal in structure. Aortic valve regurgitation is not visualized. No aortic stenosis is present. Pulmonic Valve: The pulmonic valve was normal in structure. Pulmonic valve regurgitation is not visualized. No evidence of pulmonic stenosis. Aorta: The aortic root is normal in size and structure. Venous: The inferior vena cava is normal in size with greater than 50% respiratory variability, suggesting right atrial pressure of 3 mmHg. IAS/Shunts: No atrial level shunt detected by color flow Doppler. Clearnce Hasten Electronically signed by Clearnce Hasten Signature Date/Time: 08/04/2023/9:55:47 AM  Final      LOS: 4 days   Jeoffrey Massed,  MD  [facility]    To contact the attending provider between 7A-7P or the covering provider during after hours 7P-7A, please log into the web site [email or web address] and access using universal [place] password for that web site. If you do not have the password, please call the hospital operator.  08/05/2023, 10:58 AM

## 2023-08-05 NOTE — Plan of Care (Signed)
  Problem: Education: Goal: Knowledge of General Education information will improve Description: Including pain rating scale, medication(s)/side effects and non-pharmacologic comfort measures Outcome: Progressing   Problem: Health Behavior/Discharge Planning: Goal: Ability to manage health-related needs will improve Outcome: Progressing   Problem: Clinical Measurements: Goal: Ability to maintain clinical measurements within normal limits will improve Outcome: Progressing Goal: Will remain free from infection Outcome: Progressing Goal: Diagnostic test results will improve Outcome: Progressing Goal: Respiratory complications will improve Outcome: Progressing Goal: Cardiovascular complication will be avoided Outcome: Progressing   Problem: Nutrition: Goal: Adequate nutrition will be maintained Outcome: Progressing   Problem: Elimination: Goal: Will not experience complications related to bowel motility Outcome: Progressing Goal: Will not experience complications related to urinary retention Outcome: Progressing

## 2023-08-05 NOTE — Progress Notes (Signed)
 PT NIF and VC obtained  NIF -20 VC 1.36

## 2023-08-05 NOTE — Progress Notes (Signed)
   08/05/23 1017  Mobility  Activity Moved into chair position in bed (Bed exercises)  Level of Assistance Moderate assist, patient does 50-74%  Assistive Device None  Range of Motion/Exercises Right arm;Left arm;Right leg;Left leg  Activity Response Tolerated fair  Mobility Referral Yes  Mobility visit 1 Mobility  Mobility Specialist Start Time (ACUTE ONLY) 1000  Mobility Specialist Stop Time (ACUTE ONLY) 1017  Mobility Specialist Time Calculation (min) (ACUTE ONLY) 17 min   Mobility Specialist: Progress Note  Pre-Mobility:      HR 89 SpO2 99% RA  Post-Mobility:    HR 99, SpO2 94% RA  Pt agreeable to mobility session - received in bed. Pt with knees pointed outwards. C/o BLE hamstring and calve pain with grimacing when performing BLE adduction, pillows placed for comfort and to prevent BLE abduction. Pt required heavy cues for PLB as pt would mouth breath throughout and RR would increase with exercise.  Returned to bed with all needs met - call bell within reach. Copywriter, advertising present.   BUE Exercises: arm extension (x4), push and pull (x8), bicep curl (x8) BLE exercises: ankle flexion and extension (x8), adduction/abduction (x4)  Barnie Mort, BS Mobility Specialist Please contact via SecureChat or  Rehab office at [phone].

## 2023-08-05 NOTE — Plan of Care (Signed)

## 2023-08-06 ENCOUNTER — Inpatient Hospital Stay (HOSPITAL_COMMUNITY)

## 2023-08-06 DIAGNOSIS — G610 Guillain-Barre syndrome: Secondary | ICD-10-CM | POA: Diagnosis not present

## 2023-08-06 LAB — PHOSPHORUS: Phosphorus: 2.9 mg/dL (ref 2.5–4.6)

## 2023-08-06 LAB — T4, FREE: Free T4: 1.66 ng/dL — ABNORMAL HIGH (ref 0.61–1.12)

## 2023-08-06 LAB — BASIC METABOLIC PANEL WITH GFR
Anion gap: 6 (ref 5–15)
BUN: 27 mg/dL — ABNORMAL HIGH (ref 8–23)
CO2: 26 mmol/L (ref 22–32)
Calcium: 8.8 mg/dL — ABNORMAL LOW (ref 8.9–10.3)
Chloride: 109 mmol/L (ref 98–111)
Creatinine, Ser: 0.63 mg/dL (ref 0.61–1.24)
GFR, Estimated: >60 mL/min (ref >60)
Glucose, Bld: 107 mg/dL — ABNORMAL HIGH (ref 70–99)
Potassium: 3.7 mmol/L (ref 3.5–5.1)
Sodium: 141 mmol/L (ref 135–145)

## 2023-08-06 LAB — MAGNESIUM: Magnesium: 2 mg/dL (ref 1.7–2.4)

## 2023-08-06 LAB — CBC WITH DIFFERENTIAL/PLATELET
Abs Immature Granulocytes: 0.01 10*3/uL (ref 0.00–0.07)
Basophils Absolute: 0 10*3/uL (ref 0.0–0.1)
Basophils Relative: 1 %
Eosinophils Absolute: 0.2 10*3/uL (ref 0.0–0.5)
Eosinophils Relative: 5 %
HCT: 37.1 % — ABNORMAL LOW (ref 39.0–52.0)
Hemoglobin: 12.4 g/dL — ABNORMAL LOW (ref 13.0–17.0)
Immature Granulocytes: 0 %
Lymphocytes Relative: 20 %
Lymphs Abs: 1 10*3/uL (ref 0.7–4.0)
MCH: 32 pg (ref 26.0–34.0)
MCHC: 33.4 g/dL (ref 30.0–36.0)
MCV: 95.6 fL (ref 80.0–100.0)
Monocytes Absolute: 0.8 10*3/uL (ref 0.1–1.0)
Monocytes Relative: 17 %
Neutro Abs: 2.7 10*3/uL (ref 1.7–7.7)
Neutrophils Relative %: 57 %
Platelets: 223 10*3/uL (ref 150–400)
RBC: 3.88 MIL/uL — ABNORMAL LOW (ref 4.22–5.81)
RDW: 15.3 % (ref 11.5–15.5)
WBC: 4.7 10*3/uL (ref 4.0–10.5)
nRBC: 0 % (ref 0.0–0.2)

## 2023-08-06 LAB — TSH: TSH: 0.625 u[IU]/mL (ref 0.350–4.500)

## 2023-08-06 LAB — VITAMIN E
Vitamin E (Alpha Tocopherol): 18.2 mg/L (ref 9.0–29.0)
Vitamin E(Gamma Tocopherol): 0.2 mg/L — ABNORMAL LOW (ref 0.5–4.9)

## 2023-08-06 LAB — VITAMIN B1: Vitamin B1 (Thiamine): 119.4 nmol/L (ref 66.5–200.0)

## 2023-08-06 LAB — BRAIN NATRIURETIC PEPTIDE: B Natriuretic Peptide: 41.2 pg/mL (ref 0.0–100.0)

## 2023-08-06 MED ORDER — HYDRALAZINE HCL 20 MG/ML IJ SOLN: 10.0000 mg | Freq: Four times a day (QID) | INTRAMUSCULAR | Status: DC | PRN

## 2023-08-06 MED ORDER — METOPROLOL TARTRATE 5 MG/5ML IV SOLN: 5.0000 mg | Freq: Three times a day (TID) | INTRAVENOUS | Status: DC | PRN

## 2023-08-06 MED ORDER — PROSOURCE PLUS PO LIQD
30.0000 mL | Freq: Two times a day (BID) | ORAL | Status: DC
  Administered 2023-08-06 – 2023-08-09 (×3): 30 mL via ORAL
  Filled 2023-08-06 (×3): qty 30

## 2023-08-06 MED ORDER — METOPROLOL TARTRATE 50 MG PO TABS
50.0000 mg | ORAL_TABLET | Freq: Two times a day (BID) | ORAL | Status: DC
  Administered 2023-08-06 – 2023-08-09 (×7): 50 mg via ORAL
  Filled 2023-08-06 (×7): qty 1

## 2023-08-06 NOTE — Plan of Care (Signed)

## 2023-08-06 NOTE — Progress Notes (Signed)
 PT NIF and VC obtained   NIF -15 VC 1.03 PT had poor effort

## 2023-08-06 NOTE — Progress Notes (Addendum)
   08/06/23 1509  Mobility  Activity  (Bed Exercises)  Level of Assistance Moderate assist, patient does 50-74%  Assistive Device None  Activity Response Tolerated fair  Mobility Referral Yes  Mobility visit 1 Mobility  Mobility Specialist Start Time (ACUTE ONLY) 1447  Mobility Specialist Stop Time (ACUTE ONLY) 1509  Mobility Specialist Time Calculation (min) (ACUTE ONLY) 22 min   Mobility Specialist: Progress Note  Pre-Mobility:      HR 90s, SpO2 98% RA   Pt agreeable to mobility session - received in bed. C/o pain in BLE in hamstrings, behind knee and calves. C/o LLE thigh pain. Returned to bed with all needs met - call bell within reach. Pillows placed  under and sides of BLE for comfort. Wife present.   BUE Exercises (x8 each): heel slides, ankle flexion and extension LUE Exercises (x8 each): arm raises, finger flexion and extension  Barnie Mort, BS Mobility Specialist Please contact via SecureChat or  Rehab office at [phone].

## 2023-08-06 NOTE — Progress Notes (Addendum)
 PROGRESS NOTE        PATIENT DETAILS Name: Peter Stone Age: 83 y.o. Sex: male Date of Birth: 07/23/1940 Admit Date: 08/01/2023 Admitting Physician Therisa Doyne, MD [id number], Sherie Don, MD  Brief Summary: Patient is a 83 y.o.  male with history of COPD, HTN, HLD, hard of hearing-who presented with progressive lower extremity weakness-(ongoing since Christmas-started using a walker)-but progressively worse over the past several weeks to the point that he has stopped ambulating.  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  Significant studies: 3/28>> MRI C-spine: Cervical spondylosis/degenerative disc disease. 3/28>> MRI L-spine: Ventral nerve root enhancement and cauda equina. 4/01>> MRI T-spine: Contrast enhancement of visualized nerve roots in the cauda equina. 4/01>> abdominal x-ray: Large stool burden.  Significant microbiology data: 4/01>> COVID/influenza/RSV PCR: Negative 4/02> CSF culture: No growth 4/02>> CSF meningitis/encephalitis panel: Negative 4/2>> CSF VDRL: Nonreactive 4/2>> CSF VZV PCR: Negative  Procedures: 4/02>> LP  Consults: Neurology  Subjective:  Patient in bed, appears comfortable, denies any headache, no fever, no chest pain or pressure, no shortness of breath , no abdominal pain. No new focal weakness.    Objective: Vitals: Blood pressure (!) 147/87, pulse (!) 117, temperature 98.1 F (36.7 C), temperature source Oral, resp. rate 19, height 5\' 10"  (1.778 m), weight 63.3 kg, SpO2 95%.   Exam:  Awake Alert, No new F.N deficits, Normal affect, diffusely weak all over, strength 2/5 in all 4 extremities [place].AT,PERRAL Supple Neck, No JVD,   Symmetrical Chest wall movement, Good air  movement bilaterally, CTAB RRR,No Gallops, Rubs or new Murmurs,  +ve B.Sounds, Abd Soft, No tenderness,   No Cyanosis, Clubbing or edema    Assessment/Plan: Progressive lower extremity>> upper extremity weakness CSF positive for albuminocytogenic dissociation-suspicion for CIDP Extensive CSF studies still pending IVIG x 5 days started 4/3 PT/OT CIR being contemplated Neurology following. NIF/VC being monitored by RT. Overall ++ weak, poor cough reflex.  Delirium Delirium is on 4/2 No further delirium episodes overnight Delirium precautions As needed Seroquel Doing well on melatonin/Remeron.  COPD Stable Bronchodilators  HTN BP stable on low-dose amlodipine Per H&P-patient not taking HCTZ/lisinopril.    BPH As needed bladder scans Flomax  Hypokalemia Repleted  Hard of hearing  Failure to thrive/debility/poor appetite Poor appetite but per my conversation with spouse on 4/4-eating a bit more every day Continue Remeron-encourage oral intake-and reassess on 4/7-if oral intake is still poor-May need to consider temporary cortrak placement  Nutrition Status: Nutrition Problem: Severe Malnutrition Etiology: chronic illness (COPD and HTN exacerbated by acute event requiring hospitalization) Signs/Symptoms: severe fat depletion, severe muscle depletion, percent weight loss Interventions: Boost Plus, Magic cup, Refer to RD note for recommendations, MVI    Code status:   Code Status: Full Code   DVT Prophylaxis: enoxaparin (LOVENOX) injection 40 mg Start: 08/04/23 1115 SCDs Start: 08/01/23 2147   Family Communication:Spouse- Leia Alf [phone] -at bedside on 4/4   Disposition Plan: Status is: Inpatient Remains inpatient appropriate because: Severity of illness   Planned Discharge Destination:Home   Diet: Diet Order             Diet regular Room service appropriate? Yes with Assist; Fluid consistency: Thin  Diet effective now  Antimicrobial agents: Anti-infectives (From admission, onward)    None        MEDICATIONS: Scheduled Meds:  (feeding supplement) PROSource Plus  30 mL Oral BID BM   acetaminophen  650 mg Oral Q24H   amLODipine  5 mg Oral Daily   diphenhydrAMINE  25 mg Intravenous Q24H   enoxaparin (LOVENOX) injection  40 mg Subcutaneous Q24H   lactose free nutrition  237 mL Oral TID WC   lidocaine  10 mL Intradermal Once   melatonin  5 mg Oral QHS   mirtazapine  7.5 mg Oral QHS   multivitamin with minerals  1 tablet Oral Daily   tamsulosin  0.4 mg Oral Daily   Continuous Infusions:  Immune Globulin 10% 152 mL/hr at 08/05/23 1655   PRN Meds:.acetaminophen **OR** acetaminophen, albuterol, HYDROcodone-acetaminophen, ondansetron **OR** ondansetron (ZOFRAN) IV, QUEtiapine   I have personally reviewed following labs and imaging studies  LABORATORY DATA: CBC: Recent Labs  Lab 08/01/23 1350 08/02/23 0455 08/04/23 0501 08/05/23 0602 08/06/23 0555  WBC 7.2 8.0 6.1 6.0 4.7  NEUTROABS 4.4  --   --   --  2.7  HGB 14.7 13.4 12.9* 12.5* 12.4*  HCT 44.5 40.1 37.5* 36.3* 37.1*  MCV 96.1 94.8 93.5 94.3 95.6  PLT 253 247 228 217 223    Basic Metabolic Panel: Recent Labs  Lab 08/01/23 1350 08/02/23 0059 08/02/23 0455 08/04/23 0501 08/05/23 0602 08/06/23 0555  NA 139  --  140 138 140 141  K 4.4  --  3.8 3.3* 3.6 3.7  CL 106  --  110 106 110 109  CO2 22  --  22 23 24 26   GLUCOSE 104*  --  95 120* 115* 107*  BUN 40*  --  36* 34* 28* 27*  CREATININE 0.62  --  0.60* 0.56* 0.59* 0.63  CALCIUM 9.1  --  8.9 8.5* 8.8* 8.8*  MG  --  2.1 2.0  --   --  2.0  PHOS  --  3.3 3.4  --   --  2.9    GFR: Estimated Creatinine Clearance: 60.4 mL/min (by C-G formula based on SCr of 0.63 mg/dL).  Liver Function Tests: Recent Labs  Lab 08/01/23 1350 08/02/23 0455 08/02/23 1259  AST 38 33  --   ALT 43 41  --   ALKPHOS 113 98  --   BILITOT 1.0 1.0  --   PROT 5.8* 4.5*  --   ALBUMIN 3.0* 2.6*  3.1*   No results for input(s): "LIPASE", "AMYLASE" in the last 168 hours. No results for input(s): "AMMONIA" in the last 168 hours.  Coagulation Profile: Recent Labs  Lab 08/02/23 0059  INR 1.0    Cardiac Enzymes: Recent Labs  Lab 08/02/23 0059  CKTOTAL 60    BNP (last 3 results) No results for input(s): "PROBNP" in the last 8760 hours.  Lipid Profile: No results for input(s): "CHOL", "HDL", "LDLCALC", "TRIG", "CHOLHDL", "LDLDIRECT" in the last 72 hours.  Thyroid Function Tests: No results for input(s): "TSH", "T4TOTAL", "FREET4", "T3FREE", "THYROIDAB" in the last 72 hours.   Anemia Panel: No results for input(s): "VITAMINB12", "FOLATE", "FERRITIN", "TIBC", "IRON", "RETICCTPCT" in the last 72 hours.   Urine analysis:    Component Value Date/Time   COLORURINE YELLOW 06/23/2023 1435   APPEARANCEUR CLEAR 06/23/2023 1435   LABSPEC 1.013 06/23/2023 1435   PHURINE 5.0 06/23/2023 1435   GLUCOSEU NEGATIVE 06/23/2023 1435   HGBUR NEGATIVE 06/23/2023 1435   BILIRUBINUR NEGATIVE 06/23/2023 1435  KETONESUR 20 (A) 06/23/2023 1435   PROTEINUR NEGATIVE 06/23/2023 1435   NITRITE NEGATIVE 06/23/2023 1435   LEUKOCYTESUR NEGATIVE 06/23/2023 1435    Sepsis Labs: Lactic Acid, Venous No results found for: "LATICACIDVEN"  MICROBIOLOGY: Recent Results (from the past 240 hours)  Resp panel by RT-PCR (RSV, Flu A&B, Covid) Anterior Nasal Swab     Status: None   Collection Time: 08/01/23  1:51 PM   Specimen: Anterior Nasal Swab  Result Value Ref Range Status   SARS Coronavirus 2 by RT PCR NEGATIVE NEGATIVE Final   Influenza A by PCR NEGATIVE NEGATIVE Final   Influenza B by PCR NEGATIVE NEGATIVE Final    Comment: (NOTE) The Xpert Xpress SARS-CoV-2/FLU/RSV plus assay is intended as an aid in the diagnosis of influenza from Nasopharyngeal swab specimens and should not be used as a sole basis for treatment. Nasal washings and aspirates are unacceptable for Xpert Xpress  SARS-CoV-2/FLU/RSV testing.  Fact Sheet for Patients: BloggerCourse.com  Fact Sheet for Healthcare Providers: SeriousBroker.it  This test is not yet approved or cleared by the [place] FDA and has been authorized for detection and/or diagnosis of SARS-CoV-2 by FDA under an Emergency Use Authorization (EUA). This EUA will remain in effect (meaning this test can be used) for the duration of the COVID-19 declaration under Section 564(b)(1) of the Act, 21 U.S.C. section 360bbb-3(b)(1), unless the authorization is terminated or revoked.     Resp Syncytial Virus by PCR NEGATIVE NEGATIVE Final    Comment: (NOTE) Fact Sheet for Patients: BloggerCourse.com  Fact Sheet for Healthcare Providers: SeriousBroker.it  This test is not yet approved or cleared by the [place] FDA and has been authorized for detection and/or diagnosis of SARS-CoV-2 by FDA under an Emergency Use Authorization (EUA). This EUA will remain in effect (meaning this test can be used) for the duration of the COVID-19 declaration under Section 564(b)(1) of the Act, 21 U.S.C. section 360bbb-3(b)(1), unless the authorization is terminated or revoked.  Performed at [facility], [address], [place], [place] [address]   VZV PCR, CSF     Status: None   Collection Time: 08/02/23 12:59 PM   Specimen: Lumbar Puncture; Cerebrospinal Fluid  Result Value Ref Range Status   VZV PCR, CSF Negative Negative Final    Comment: (NOTE) No Varicella Zoster Virus DNA detected. Performed At: [facility] [address] [place], [place] [id number] Jolene Schimke MD WN:[id number]   CSF culture w Gram Stain     Status: None   Collection Time: 08/02/23 12:59 PM   Specimen: PATH Cytology CSF; Cerebrospinal Fluid  Result Value Ref Range Status   Specimen Description CSF  Final   Special Requests NONE  Final    Gram Stain   Final    WBC PRESENT, PREDOMINANTLY MONONUCLEAR NO ORGANISMS SEEN CYTOSPIN SMEAR    Culture   Final    NO GROWTH 3 DAYS Performed at [facility], [address], [place], [place] [address]    Report Status 08/05/2023 FINAL  Final    RADIOLOGY STUDIES/RESULTS: No results found.    LOS: 5 days   Signature  -    Susa Raring M.D on 08/06/2023 at 10:28 AM   -  To page go to [email or web address]

## 2023-08-07 DIAGNOSIS — I4891 Unspecified atrial fibrillation: Secondary | ICD-10-CM | POA: Diagnosis not present

## 2023-08-07 DIAGNOSIS — R9089 Other abnormal findings on diagnostic imaging of central nervous system: Secondary | ICD-10-CM | POA: Diagnosis not present

## 2023-08-07 DIAGNOSIS — R531 Weakness: Secondary | ICD-10-CM | POA: Diagnosis not present

## 2023-08-07 DIAGNOSIS — G610 Guillain-Barre syndrome: Secondary | ICD-10-CM | POA: Diagnosis not present

## 2023-08-07 LAB — BLOOD GAS, ARTERIAL
Acid-Base Excess: 2.7 mmol/L — ABNORMAL HIGH (ref 0.0–2.0)
Bicarbonate: 26.2 mmol/L (ref 20.0–28.0)
O2 Saturation: 99.9 %
Patient temperature: 36.8
pCO2 arterial: 36 mmHg (ref 32–48)
pH, Arterial: 7.47 — ABNORMAL HIGH (ref 7.35–7.45)
pO2, Arterial: 85 mmHg (ref 83–108)

## 2023-08-07 LAB — COMPREHENSIVE METABOLIC PANEL WITH GFR
ALT: 38 U/L (ref 0–44)
AST: 40 U/L (ref 15–41)
Albumin: 2.2 g/dL — ABNORMAL LOW (ref 3.5–5.0)
Alkaline Phosphatase: 89 U/L (ref 38–126)
Anion gap: 7 (ref 5–15)
BUN: 28 mg/dL — ABNORMAL HIGH (ref 8–23)
CO2: 24 mmol/L (ref 22–32)
Calcium: 8.6 mg/dL — ABNORMAL LOW (ref 8.9–10.3)
Chloride: 109 mmol/L (ref 98–111)
Creatinine, Ser: 0.51 mg/dL — ABNORMAL LOW (ref 0.61–1.24)
GFR, Estimated: >60 mL/min (ref >60)
Glucose, Bld: 112 mg/dL — ABNORMAL HIGH (ref 70–99)
Potassium: 3.7 mmol/L (ref 3.5–5.1)
Sodium: 140 mmol/L (ref 135–145)
Total Bilirubin: 0.6 mg/dL (ref 0.0–1.2)
Total Protein: 6.5 g/dL (ref 6.5–8.1)

## 2023-08-07 LAB — PHOSPHORUS: Phosphorus: 3.1 mg/dL (ref 2.5–4.6)

## 2023-08-07 LAB — CBC WITH DIFFERENTIAL/PLATELET
Abs Immature Granulocytes: 0.01 10*3/uL (ref 0.00–0.07)
Basophils Absolute: 0 10*3/uL (ref 0.0–0.1)
Basophils Relative: 1 %
Eosinophils Absolute: 0.3 10*3/uL (ref 0.0–0.5)
Eosinophils Relative: 6 %
HCT: 35.6 % — ABNORMAL LOW (ref 39.0–52.0)
Hemoglobin: 12 g/dL — ABNORMAL LOW (ref 13.0–17.0)
Immature Granulocytes: 0 %
Lymphocytes Relative: 18 %
Lymphs Abs: 0.9 10*3/uL (ref 0.7–4.0)
MCH: 32.1 pg (ref 26.0–34.0)
MCHC: 33.7 g/dL (ref 30.0–36.0)
MCV: 95.2 fL (ref 80.0–100.0)
Monocytes Absolute: 0.8 10*3/uL (ref 0.1–1.0)
Monocytes Relative: 16 %
Neutro Abs: 2.8 10*3/uL (ref 1.7–7.7)
Neutrophils Relative %: 59 %
Platelets: 216 10*3/uL (ref 150–400)
RBC: 3.74 MIL/uL — ABNORMAL LOW (ref 4.22–5.81)
RDW: 15.3 % (ref 11.5–15.5)
WBC: 4.7 10*3/uL (ref 4.0–10.5)
nRBC: 0 % (ref 0.0–0.2)

## 2023-08-07 LAB — MAGNESIUM: Magnesium: 2.1 mg/dL (ref 1.7–2.4)

## 2023-08-07 LAB — BRAIN NATRIURETIC PEPTIDE: B Natriuretic Peptide: 37.5 pg/mL (ref 0.0–100.0)

## 2023-08-07 MED ORDER — CHLORHEXIDINE GLUCONATE CLOTH 2 % EX PADS
6.0000 | MEDICATED_PAD | Freq: Every day | CUTANEOUS | Status: DC
  Administered 2023-08-08 – 2023-08-09 (×2): 6 via TOPICAL

## 2023-08-07 NOTE — Progress Notes (Signed)
 Pt NIF and VC done with great effort. NIF: -25 cmH2O VC: 1.61L

## 2023-08-07 NOTE — Progress Notes (Signed)
 PROGRESS NOTE        PATIENT DETAILS Name: Peter Stone Age: 83 y.o. Sex: male Date of Birth: 09-Jul-1940 Admit Date: 08/01/2023 Admitting Physician Therisa Doyne, MD ZOX:[id number], Sherie Don, MD  Brief Summary: Patient is a 83 y.o.  male with history of COPD, HTN, HLD, hard of hearing-who presented with progressive lower extremity weakness-(ongoing since Christmas-started using a walker)-but progressively worse over the past several weeks to the point that he has stopped ambulating.  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  Significant studies: 3/28>> MRI C-spine: Cervical spondylosis/degenerative disc disease. 3/28>> MRI L-spine: Ventral nerve root enhancement and cauda equina. 4/01>> MRI T-spine: Contrast enhancement of visualized nerve roots in the cauda equina. 4/01>> abdominal x-ray: Large stool burden.  Significant microbiology data: 4/01>> COVID/influenza/RSV PCR: Negative 4/02> CSF culture: No growth 4/02>> CSF meningitis/encephalitis panel: Negative 4/2>> CSF VDRL: Nonreactive 4/2>> CSF VZV PCR: Negative  Procedures: 4/02>> LP  Consults: Neurology  Subjective:  Patient in bed, appears comfortable, denies any headache, no fever, no chest pain or pressure, no shortness of breath , no abdominal pain. No new focal weakness.   Objective: Vitals: Blood pressure 120/68, pulse 83, temperature (!) 97.5 F (36.4 C), temperature source Oral, resp. rate 19, height 5\' 10"  (1.778 m), weight 63.3 kg, SpO2 95%.   Exam:  Awake Alert, No new F.N deficits, Normal affect, diffusely weak all over, strength 2/5 in all 4 extremities [place].AT,PERRAL Supple Neck, No JVD,   Symmetrical Chest wall movement, Good air movement  bilaterally, CTAB RRR,No Gallops, Rubs or new Murmurs,  +ve B.Sounds, Abd Soft, No tenderness,   No Cyanosis, Clubbing or edema    Assessment/Plan:  Progressive lower extremity>> upper extremity weakness CSF positive for albuminocytogenic dissociation-suspicion for CIDP Extensive CSF studies still pending IVIG x 5 days started 08/03/23, may need IV Steroids per Neuro PT/OT CIR being contemplated Neurology following. NIF/VC being monitored by RT. Overall ++ weak, poor cough reflex.   Delirium Delirium is on 4/2 No further delirium episodes overnight Delirium precautions As needed Seroquel Doing well on melatonin/Remeron.  COPD Stable Bronchodilators  HTN BP stable on low-dose amlodipine Per H&P-patient not taking HCTZ/lisinopril.    BPH As needed bladder scans Flomax  Hypokalemia Repleted  Hard of hearing  Failure to thrive/debility/poor appetite Poor appetite but per my conversation with spouse on 4/4-eating a bit more every day Continue Remeron-encourage oral intake-willy improving, neurology also contemplating to add IV steroids on 08/07/2023.  Continue to monitor oral intake.  Nutrition Status: Nutrition Problem: Severe Malnutrition Etiology: chronic illness (COPD and HTN exacerbated by acute event requiring hospitalization) Signs/Symptoms: severe fat depletion, severe muscle depletion, percent weight loss Interventions: Boost Plus, Magic cup, Refer to RD note for recommendations, MVI    Code status:   Code Status: Full Code   DVT Prophylaxis: enoxaparin (LOVENOX) injection 40 mg Start: 08/04/23 1115 SCDs Start: 08/01/23 2147   Family Communication:Spouse- Leia Alf [phone] -  on 08/07/2023 at 10:04 AM   Disposition Plan: Status is: Inpatient Remains inpatient appropriate because: Severity of illness   Planned Discharge Destination:Home   Diet: Diet Order             Diet regular Room service appropriate? Yes  with Assist; Fluid consistency:  Thin  Diet effective now                     Antimicrobial agents: Anti-infectives (From admission, onward)    None        MEDICATIONS: Scheduled Meds:  (feeding supplement) PROSource Plus  30 mL Oral BID BM   acetaminophen  650 mg Oral Q24H   diphenhydrAMINE  25 mg Intravenous Q24H   enoxaparin (LOVENOX) injection  40 mg Subcutaneous Q24H   lactose free nutrition  237 mL Oral TID WC   lidocaine  10 mL Intradermal Once   melatonin  5 mg Oral QHS   metoprolol tartrate  50 mg Oral BID   mirtazapine  7.5 mg Oral QHS   multivitamin with minerals  1 tablet Oral Daily   tamsulosin  0.4 mg Oral Daily   Continuous Infusions:  Immune Globulin 10% 152 mL/hr at 08/07/23 0112   PRN Meds:.acetaminophen **OR** acetaminophen, albuterol, hydrALAZINE, HYDROcodone-acetaminophen, metoprolol tartrate, ondansetron **OR** ondansetron (ZOFRAN) IV, QUEtiapine   I have personally reviewed following labs and imaging studies  LABORATORY DATA: CBC: Recent Labs  Lab 08/01/23 1350 08/02/23 0455 08/04/23 0501 08/05/23 0602 08/06/23 0555 08/07/23 0426  WBC 7.2 8.0 6.1 6.0 4.7 4.7  NEUTROABS 4.4  --   --   --  2.7 2.8  HGB 14.7 13.4 12.9* 12.5* 12.4* 12.0*  HCT 44.5 40.1 37.5* 36.3* 37.1* 35.6*  MCV 96.1 94.8 93.5 94.3 95.6 95.2  PLT 253 247 228 217 223 216    Basic Metabolic Panel: Recent Labs  Lab 08/02/23 0059 08/02/23 0455 08/04/23 0501 08/05/23 0602 08/06/23 0555 08/07/23 0426  NA  --  140 138 140 141 140  K  --  3.8 3.3* 3.6 3.7 3.7  CL  --  110 106 110 109 109  CO2  --  22 23 24 26 24   GLUCOSE  --  95 120* 115* 107* 112*  BUN  --  36* 34* 28* 27* 28*  CREATININE  --  0.60* 0.56* 0.59* 0.63 0.51*  CALCIUM  --  8.9 8.5* 8.8* 8.8* 8.6*  MG 2.1 2.0  --   --  2.0 2.1  PHOS 3.3 3.4  --   --  2.9 3.1    GFR: Estimated Creatinine Clearance: 60.4 mL/min (A) (by C-G formula based on SCr of 0.51 mg/dL (L)).  Liver Function Tests: Recent Labs  Lab 08/01/23 1350  08/02/23 0455 08/02/23 1259 08/07/23 0426  AST 38 33  --  40  ALT 43 41  --  38  ALKPHOS 113 98  --  89  BILITOT 1.0 1.0  --  0.6  PROT 5.8* 4.5*  --  6.5  ALBUMIN 3.0* 2.6* 3.1* 2.2*   No results for input(s): "LIPASE", "AMYLASE" in the last 168 hours. No results for input(s): "AMMONIA" in the last 168 hours.  Coagulation Profile: Recent Labs  Lab 08/02/23 0059  INR 1.0    Cardiac Enzymes: Recent Labs  Lab 08/02/23 0059  CKTOTAL 60    BNP (last 3 results) No results for input(s): "PROBNP" in the last 8760 hours.  Lipid Profile: No results for input(s): "CHOL", "HDL", "LDLCALC", "TRIG", "CHOLHDL", "LDLDIRECT" in the last 72 hours.  Thyroid Function Tests: Recent Labs    08/06/23 0505 08/06/23 0555  TSH 0.625  --   FREET4  --  1.66*     Anemia Panel: No results for input(s): "VITAMINB12", "FOLATE", "FERRITIN", "TIBC", "IRON", "RETICCTPCT" in the  last 72 hours.   Urine analysis:    Component Value Date/Time   COLORURINE YELLOW 06/23/2023 1435   APPEARANCEUR CLEAR 06/23/2023 1435   LABSPEC 1.013 06/23/2023 1435   PHURINE 5.0 06/23/2023 1435   GLUCOSEU NEGATIVE 06/23/2023 1435   HGBUR NEGATIVE 06/23/2023 1435   BILIRUBINUR NEGATIVE 06/23/2023 1435   KETONESUR 20 (A) 06/23/2023 1435   PROTEINUR NEGATIVE 06/23/2023 1435   NITRITE NEGATIVE 06/23/2023 1435   LEUKOCYTESUR NEGATIVE 06/23/2023 1435    Sepsis Labs: Lactic Acid, Venous No results found for: "LATICACIDVEN"  MICROBIOLOGY: Recent Results (from the past 240 hours)  Resp panel by RT-PCR (RSV, Flu A&B, Covid) Anterior Nasal Swab     Status: None   Collection Time: 08/01/23  1:51 PM   Specimen: Anterior Nasal Swab  Result Value Ref Range Status   SARS Coronavirus 2 by RT PCR NEGATIVE NEGATIVE Final   Influenza A by PCR NEGATIVE NEGATIVE Final   Influenza B by PCR NEGATIVE NEGATIVE Final    Comment: (NOTE) The Xpert Xpress SARS-CoV-2/FLU/RSV plus assay is intended as an aid in the diagnosis  of influenza from Nasopharyngeal swab specimens and should not be used as a sole basis for treatment. Nasal washings and aspirates are unacceptable for Xpert Xpress SARS-CoV-2/FLU/RSV testing.  Fact Sheet for Patients: BloggerCourse.com  Fact Sheet for Healthcare Providers: SeriousBroker.it  This test is not yet approved or cleared by the [place] FDA and has been authorized for detection and/or diagnosis of SARS-CoV-2 by FDA under an Emergency Use Authorization (EUA). This EUA will remain in effect (meaning this test can be used) for the duration of the COVID-19 declaration under Section 564(b)(1) of the Act, 21 U.S.C. section 360bbb-3(b)(1), unless the authorization is terminated or revoked.     Resp Syncytial Virus by PCR NEGATIVE NEGATIVE Final    Comment: (NOTE) Fact Sheet for Patients: BloggerCourse.com  Fact Sheet for Healthcare Providers: SeriousBroker.it  This test is not yet approved or cleared by the [place] FDA and has been authorized for detection and/or diagnosis of SARS-CoV-2 by FDA under an Emergency Use Authorization (EUA). This EUA will remain in effect (meaning this test can be used) for the duration of the COVID-19 declaration under Section 564(b)(1) of the Act, 21 U.S.C. section 360bbb-3(b)(1), unless the authorization is terminated or revoked.  Performed at [facility], [address], [place], [place] [address]   VZV PCR, CSF     Status: None   Collection Time: 08/02/23 12:59 PM   Specimen: Lumbar Puncture; Cerebrospinal Fluid  Result Value Ref Range Status   VZV PCR, CSF Negative Negative Final    Comment: (NOTE) No Varicella Zoster Virus DNA detected. Performed At: [facility] [address] [place], [place] [id number] Jolene Schimke MD PP:[id number]   CSF culture w Gram Stain     Status: None   Collection Time:  08/02/23 12:59 PM   Specimen: PATH Cytology CSF; Cerebrospinal Fluid  Result Value Ref Range Status   Specimen Description CSF  Final   Special Requests NONE  Final   Gram Stain   Final    WBC PRESENT, PREDOMINANTLY MONONUCLEAR NO ORGANISMS SEEN CYTOSPIN SMEAR    Culture   Final    NO GROWTH 3 DAYS Performed at [facility], [address] [place], [place] [address]    Report Status 08/05/2023 FINAL  Final    RADIOLOGY STUDIES/RESULTS: DG Chest Port 1 View Result Date: 08/06/2023 CLINICAL DATA:  Shortness of breath. EXAM: PORTABLE CHEST  1 VIEW COMPARISON:  08/01/2023 FINDINGS: The lungs are clear without focal pneumonia, edema, pneumothorax or pleural effusion. The cardiopericardial silhouette is within normal limits for size. No acute bony abnormality. Telemetry leads overlie the chest. IMPRESSION: No active disease. Electronically Signed   By: Kennith Center M.D.   On: 08/06/2023 12:15      LOS: 6 days   Signature  -    Susa Raring M.D on 08/07/2023 at 10:03 AM   -  To page go to [email or web address]

## 2023-08-07 NOTE — Plan of Care (Signed)
  Problem: Clinical Measurements: Goal: Will remain free from infection Outcome: Progressing Goal: Respiratory complications will improve Outcome: Progressing   Problem: Activity: Goal: Risk for activity intolerance will decrease Outcome: Progressing   

## 2023-08-07 NOTE — Progress Notes (Signed)
 Physical Therapy Treatment Patient Details Name: Peter Stone MRN: [id number] DOB: 1941/04/12 Today's Date: 08/07/2023   History of Present Illness Patient is an 83 yo male presenting to the ED with progressive weakness on 08/01/23. MRI of the lumbar spine showing enhancement of nerve roots concerning for inflammatory versus infectious versus low suspicion neoplastic process. Lumbar puncture on 08/02/23 positive for albuminocytogenic dissociation, pt being worked up for GBS. PMH includes: COPD, HTN, HLD, HOH, OSA.    PT Comments  Pt received in supine, awake but frequently closing eyes, pt agreeable to therapy session with encouragement from spouse Peter Stone who was present. Pt needing multimodal cues and hand over hand assist to reach for opposite bed rail while rolling but totalA for BLE placement, pulling toward side and shifting his hips in each direction. Pt totalA +2 for hoyer OOB to chair after hygiene assist provided and maxA for reclined to long sitting posture in chair, pt becoming more fatigued/lethargic after transfer OOB to chair and AA/PROM of UE and BLE and pt not yet safe to attempt standing trials from chair due to weakness. Pt and spouse encouraged to have him sit up ~2 hours in chair to build tolerance for OOB activity and for improved pulmonary clearance. Pt continues to benefit from PT services to progress toward functional mobility goals, plan to work on standing with Peter Stone or Peter Stone Plus lift next session if pt more alert and able to tolerate.    If plan is discharge home, recommend the following: Two people to help with walking and/or transfers;Two people to help with bathing/dressing/bathroom;Assistance with feeding   Can travel by private vehicle     No  Equipment Recommendations  Wheelchair (measurements PT);Hoyer lift;Hospital bed;Wheelchair cushion (measurements PT)    Recommendations for Other Services       Precautions / Restrictions Precautions Precautions:  Fall Recall of Precautions/Restrictions: Intact Precaution/Restrictions Comments: fear of falling Restrictions Weight Bearing Restrictions Per Provider Order: No     Mobility  Bed Mobility Overal bed mobility: Needs Assistance Bed Mobility: Supine to Sit, Rolling Rolling: Total assist, +2 for physical assistance, Used rails   Supine to sit: Max assist, +2 for physical assistance, Used rails, HOB elevated     General bed mobility comments: Rolling x4 reps (x2 each direction) for peri-care and then placement of lift pad after soiled bed pad replaced beneath him. From recliner, pt able to pull to long sitting in chair with LUE arm rest and maxA from PTA, pt with notable assist wtih core muscles this date and able to maintain forward lean ~10 seconds prior to leaning back after pillow placed behind him.    Transfers Overall transfer level: Needs assistance   Transfers: Bed to chair/wheelchair/BSC             General transfer comment: bed>recliner via hoyer Transfer via Lift Equipment: Maximove  Ambulation/Gait                   Stairs             Wheelchair Mobility     Tilt Bed    Modified Rankin (Stroke Patients Only)       Balance Overall balance assessment: Needs assistance Sitting-balance support: Bilateral upper extremity supported, Feet unsupported Sitting balance-Leahy Scale: Poor Sitting balance - Comments: tight grip on L hand on chair arm rest, with pt only tolerating upright posture ~10 seconds prior to leaning back against chair back support/pillow. Postural control: Posterior lean     Standing balance  comment: NT due to safety and pt fatigue                            Communication Communication Communication: Impaired Factors Affecting Communication: Hearing impaired;Difficulty expressing self (hearing aides)  Cognition Arousal: Lethargic Behavior During Therapy: Anxious, Lability   PT - Cognitive impairments:  Orientation, Awareness, Memory, Attention, Initiation, Sequencing, Problem solving, Safety/Judgement   Orientation impairments: Place, Time, Situation                   PT - Cognition Comments: Pt initially with eyes open but during most mobility tasks, pt closing his eyes and often holding his breath. Pt is extremely fearful of falling with all mobility, even voices fear of falling when rolling in supine; pt had BM but did not notify staff prior to working on rolling for transfer; pt needs multimodal cues to initiate and perform all tasks but still with poor initiation. Following commands: Impaired Following commands impaired: Follows one step commands with increased time, Follows one step commands inconsistently    Cueing Cueing Techniques: Verbal cues, Visual cues, Gestural cues  Exercises Other Exercises Other Exercises: seated BLE AA/PROM: knee flex/ext and hip flexion x10 reps ea (mostly PROM) Other Exercises: Bil UE AAROM: shoulder flexion, elbow flex/ext, finger extension/grasping (PROM at times for finger extension) x10 reps ea    General Comments General comments (skin integrity, edema, etc.): Pt soiled with + BM but was unaware when staff arrived to room; totalA for peri-care; SpO2/HR WFL on RA and BP 128/105 in recliner (pt tightening his L arm while BP being checked in LUE due to anxiety/confusion).      Pertinent Vitals/Pain Pain Assessment Pain Assessment: PAINAD Breathing: occasional labored breathing, short period of hyperventilation Negative Vocalization: repeated troubled calling out, loud moaning/groaning, crying Facial Expression: sad, frightened, frown Body Language: tense, distressed pacing, fidgeting Consolability: distracted or reassured by voice/touch PAINAD Score: 6 Pain Location: BLE with mobility, and generalized pain/grimacing and calling out with each movement with UB/LB due to pt apparent anxiety/fear of falls so difficult to tell what is truly  painful and what is related to his fear of falls. Pain Descriptors / Indicators: Stabbing, Grimacing, Moaning Pain Intervention(s): Limited activity within patient's tolerance, Monitored during session, Repositioned    Home Living                          Prior Function            PT Goals (current goals can now be found in the care plan section) Acute Rehab PT Goals Patient Stated Goal: Per spouse, for him to get stronger PT Goal Formulation: With patient Time For Goal Achievement: 08/16/23 Progress towards PT goals: Progressing toward goals    Frequency    Min 2X/week      PT Plan      Co-evaluation              AM-PAC PT "6 Clicks" Mobility   Outcome Measure  Help needed turning from your back to your side while in a flat bed without using bedrails?: Total Help needed moving from lying on your back to sitting on the side of a flat bed without using bedrails?: Total Help needed moving to and from a bed to a chair (including a wheelchair)?: Total Help needed standing up from a chair using your arms (e.g., wheelchair or bedside chair)?: Total Help needed  to walk in hospital room?: Total Help needed climbing 3-5 steps with a railing? : Total 6 Click Score: 6    End of Session Equipment Utilized During Treatment: Other (comment) (mechanical lift) Activity Tolerance: Patient limited by lethargy;Treatment limited secondary to medical complications (Comment);Other (comment) (anxiety/fear of falls and cognitive deficit limiting progress) Patient left: in chair;with call bell/phone within reach;with chair alarm set;with family/visitor present;Other (comment) (spouse requesting to shave him, RN notified) Nurse Communication: Mobility status;Need for lift equipment PT Visit Diagnosis: Muscle weakness (generalized) (M62.81);Other symptoms and signs involving the nervous system (R29.898);History of falling (Z91.81);Unsteadiness on feet (R26.81);Other abnormalities  of gait and mobility (R26.89);Pain;Difficulty in walking, not elsewhere classified (R26.2) Pain - Right/Left:  (R>L) Pain - part of body: Leg     Time: 1610-9604 PT Time Calculation (min) (ACUTE ONLY): 38 min  Charges:    $Therapeutic Exercise: 8-22 mins $Therapeutic Activity: 23-37 mins PT General Charges $$ ACUTE PT VISIT: 1 Visit                     Tasheba Henson P., PTA Acute Rehabilitation Services Secure Chat Preferred 9a-5:30pm Office: [phone]    Dorathy Kinsman [facility] 08/07/2023, 3:57 PM

## 2023-08-07 NOTE — Progress Notes (Signed)
 Inpatient Rehab Admissions Coordinator:  Saw pt at bedside. Pt was lethargic and wife not present. Will continue to monitor progress with therapies since completion of IVIG to help determine appropriateness for CIR. Will continue to follow.   Wolfgang Phoenix, MS, CCC-SLP Admissions Coordinator [phone]

## 2023-08-07 NOTE — TOC Progression Note (Signed)
 Transition of Care [facility]) - Progression Note    Patient Details  Name: Peter Stone MRN: [id number] Date of Birth: 08/22/1940  Transition of [facility] CM/SW Contact  Mearl Latin, LCSW Phone Number: 08/07/2023, 1:17 PM  Clinical Narrative:    CSW received call from patient's spouse and she confirmed choice 1-[facility] 2-[facility] if CIR is not able to accept patient. Will continue to follow for medical stability.    Expected Discharge Plan: Skilled Nursing Facility Barriers to Discharge: Continued Medical Work up, English as a second language teacher, SNF Pending bed offer  Expected Discharge Plan and Services In-house Referral: Clinical Social Work     Living arrangements for the past 2 months: Single Family Home                                       Social Determinants of Health (SDOH) Interventions SDOH Screenings   Food Insecurity: No Food Insecurity (08/01/2023)  Housing: Low Risk  (08/01/2023)  Transportation Needs: No Transportation Needs (08/01/2023)  Utilities: Not At Risk (08/01/2023)  Social Connections: Moderately Isolated (08/01/2023)  Tobacco Use: Low Risk  (08/01/2023)    Readmission Risk Interventions     No data to display

## 2023-08-07 NOTE — Progress Notes (Signed)
 NEUROLOGY CONSULT FOLLOW UP NOTE   Date of service: August 07, 2023 Patient Name: Peter Stone MRN:  [id number] DOB:  1940-07-10  Interval Hx/subjective   Generalized profound weakness continues. He is oriented to place and time but does get confused and is unable to follow anything more than very simple commands, poor attention and concentration.  BLE are weaker than BUE, distal weaker than proximal.   Patient was able to tell me that he hasn't had much of an apatite and that things he used to enjoy "just dont taste the same to me anymore" and that "I just don't want to eat anything". He confirms that he started feeling bad around Christmas.   Afebrile. No acute events overnight. No family at bedside.   Vitals   Vitals:   08/06/23 1630 08/06/23 1645 08/06/23 1800 08/07/23 0300  BP: 139/83 (!) 120/92 (!) 148/61 120/68  Pulse: 98 98 97 83  Resp: 18 19 20 19   Temp:    (!) 97.5 F (36.4 C)  TempSrc:    Oral  SpO2: 96% 96% 95% 95%  Weight:      Height:         Body mass index is 20.02 kg/m.  Physical Exam   Constitutional: Frail elderly male in no apparent distress Psych: Flat affect Eyes: No scleral injection.  HENT: No OP obstrucion.  Head: Normocephalic.  Cardiovascular: Normal rate and regular rhythm.  Respiratory: Effort normal, non-labored breathing.  GI: Soft.  No distension. There is no tenderness.  Skin: WDI.   Neurologic Examination   Neuro: Mental Status: Patient is awake, alert, oriented to person, place, month, year, and situation.  Mild weakness with talking Cranial Nerves: II-XII grossly intact, including strong shoulder shrug bilaterally with resistance strength bilaterally and hard of hearing at baseline. X: voice is hoarse, no issues noted with swallowing water on exam.  Motor: Tone is normal. Bulk is normal.  On upper extremities, the left side continues to be slightly stronger  Patient notes severe pain when legs are passively lifted off  bed                                                  Right              Left  Shoulder Abduction               3/5                 4/5 Elbow Flexion                          2/5                  3/5 Elbow Extension                     2/5                   3/5 Wrist Flexion                           2/5                   3/5 Wrist Extension  3/5                   4-/5 Grip                                         3/5                   4+/5   Hip Flexion                              1/5                   1/5   BLE exam limited by patient's pain with lifting Knee Flexion                           1/5                  2/5 Knee Extension                       1/5                  2/5 Ankle Dorsiflexion                   2/5                  3/5 Ankle Plantarflexion                2/5                  4-/5   Patient was unable to follow directions for single breath count test. He was able to take multiple deep breaths and exhale forcefully with no changes to oxygen saturation on monitor or any change in his breathing pattern. No change in breathing or oxygen saturation noted with continued conversation.     Medications  Current Facility-Administered Medications:    (feeding supplement) PROSource Plus liquid 30 mL, 30 mL, Oral, BID BM, Leroy Sea, MD, 30 mL at 08/06/23 1000   acetaminophen (TYLENOL) tablet 650 mg, 650 mg, Oral, Q6H PRN, 650 mg at 08/06/23 2103 **OR** acetaminophen (TYLENOL) suppository 650 mg, 650 mg, Rectal, Q6H PRN, Doutova, Anastassia, MD   acetaminophen (TYLENOL) tablet 650 mg, 650 mg, Oral, Q24H, Dessie, Habtemariam, MD, 650 mg at 08/06/23 1359   albuterol (PROVENTIL) (2.5 MG/3ML) 0.083% nebulizer solution 2.5 mg, 2.5 mg, Nebulization, Q2H PRN, Doutova, Anastassia, MD   diphenhydrAMINE (BENADRYL) injection 25 mg, 25 mg, Intravenous, Q24H, Dessie, Habtemariam, MD, 25 mg at 08/06/23 1400   enoxaparin (LOVENOX) injection 40 mg, 40 mg, Subcutaneous,  Q24H, Ghimire, Shanker M, MD, 40 mg at 08/06/23 1200   hydrALAZINE (APRESOLINE) injection 10 mg, 10 mg, Intravenous, Q6H PRN, Leroy Sea, MD   HYDROcodone-acetaminophen (NORCO/VICODIN) 5-325 MG per tablet 1-2 tablet, 1-2 tablet, Oral, Q4H PRN, Doutova, Anastassia, MD, 1 tablet at 08/05/23 1808   Immune Globulin 10% (PRIVIGEN) IV infusion 25 g, 400 mg/kg, Intravenous, Q24 Hr x 5, Dessie, Habtemariam, MD, Last Rate: 152 mL/hr at 08/07/23 0112, Infusion Verify at 08/07/23 0112   lactose free nutrition (BOOST PLUS) liquid 237 mL, 237 mL, Oral, TID WC, Ghimire, Shanker M, MD, 237 mL at 08/06/23 1735   lidocaine (XYLOCAINE)  2 % (with pres) injection 200 mg, 10 mL, Intradermal, Once, Doutova, Anastassia, MD   melatonin tablet 5 mg, 5 mg, Oral, QHS, Ghimire, Shanker M, MD, 5 mg at 08/06/23 2102   metoprolol tartrate (LOPRESSOR) injection 5 mg, 5 mg, Intravenous, Q8H PRN, Leroy Sea, MD   metoprolol tartrate (LOPRESSOR) tablet 50 mg, 50 mg, Oral, BID, Leroy Sea, MD, 50 mg at 08/06/23 2102   mirtazapine (REMERON) tablet 7.5 mg, 7.5 mg, Oral, QHS, Ghimire, Shanker M, MD, 7.5 mg at 08/06/23 2102   multivitamin with minerals tablet 1 tablet, 1 tablet, Oral, Daily, Ghimire, Werner Lean, MD, 1 tablet at 08/06/23 0918   ondansetron (ZOFRAN) tablet 4 mg, 4 mg, Oral, Q6H PRN **OR** ondansetron (ZOFRAN) injection 4 mg, 4 mg, Intravenous, Q6H PRN, Doutova, Anastassia, MD   QUEtiapine (SEROQUEL) tablet 25 mg, 25 mg, Oral, Daily PRN, Maretta Bees, MD, 25 mg at 08/06/23 2102   tamsulosin (FLOMAX) capsule 0.4 mg, 0.4 mg, Oral, Daily, Ghimire, Shanker M, MD, 0.4 mg at 08/06/23 0919  Labs and Diagnostic Imaging   CBC:  Recent Labs  Lab 08/06/23 0555 08/07/23 0426  WBC 4.7 4.7  NEUTROABS 2.7 2.8  HGB 12.4* 12.0*  HCT 37.1* 35.6*  MCV 95.6 95.2  PLT 223 216    Basic Metabolic Panel:  Lab Results  Component Value Date   NA 140 08/07/2023   K 3.7 08/07/2023   CO2 24 08/07/2023    GLUCOSE 112 (H) 08/07/2023   BUN 28 (H) 08/07/2023   CREATININE 0.51 (L) 08/07/2023   CALCIUM 8.6 (L) 08/07/2023   GFRNONAA >60 08/07/2023   GFRAA  07/10/2008    >60        The eGFR has been calculated using the MDRD equation. This calculation has not been validated in all clinical situations. eGFR's persistently <60 mL/min signify possible Chronic Kidney Disease.   Lipid Panel: No results found for: "LDLCALC" HgbA1c: No results found for: "HGBA1C" Urine Drug Screen: No results found for: "LABOPIA", "COCAINSCRNUR", "LABBENZ", "AMPHETMU", "THCU", "LABBARB"  Alcohol Level No results found for: "ETH" INR  Lab Results  Component Value Date   INR 1.0 08/02/2023   APTT No results found for: "APTT" AED levels: No results found for: "PHENYTOIN", "ZONISAMIDE", "LAMOTRIGINE", "LEVETIRACETA"  MRI Brain(Personally reviewed): No acute intracranial abnormality. Findings of chronic small vessel ischemia.   MRI L/T/C Spine: Substantial abnormal ventral nerve root enhancement in the cauda equina, with some low-level leptomeningeal enhancement along the anterior margin of the conus. Guillain-Barre syndrome is the most likely cause of this pattern. Small central disc protrusion at T7-8 without spinal canal stenosis. Cervical spondylosis and degenerative disc disease causing prominent impingement at C3-4 and C4-5; mild to moderate impingement at C2-3; and mild impingement at C6-7   LP 4/2: CSF Culture: Prelim negative VDRL: non reactive  Cell count: RBC 1, WBC 2 IgG CSF: 8.4 M/E Panel: Negative OC Bands: Pending Protein: 93 Glucose: 64 VZV PCR: Negative   Lyme Disease: Negative    Assessment   Peter Stone is a 83 y.o. male with PMH significant for HTN, OSA, HLD who presented 4/1 with progressive generalized weakness, proximal worse than distal, now developing BUE weakness and bulbar symptoms. MRI L spine concerning for GBS, with leptomeningeal enhancement adding infectious  versus inflammatory versus malignant/metastatic processes to the possible differentials. MRI L spine, however, also showed evidence of contrast enhancement seen in GBS.    Chart review does show decreased PO intake after a fall in February.  He was seen by outpatient neurology for the progressive weakness on 3/21 where wife noted him slowing down over the last few years. Patient had a more acute decline after a viral illness in December 2024, with inability to stand a couple of weeks after that. PT was ordered. Afib was noted by therapist, which is a new diagnosis for patient. He is scheduled for OP cardiology 4/14. EKGs this admission have shown SR with RBB and prolonged QT.    LP was completed 4/2 under fluoro. High protein, consistent with albuminocytogenic dissociation. OG bands pending .    Albumin is low, as expected with his recent history of low PO intake. 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 Patient also takes remeron, which has a possible side effect of weakness and fatigue. However, patient just started on this medication 3/28 well after these symptoms started.   IVIG started 4/3, last dose scheduled for today. On exam, patient does not seem to have any improvement in his weakness from the IVIG. Will discuss with Dr. Loleta Chance regarding starting steroids vs repeat IVIG as outpatient.   Recommendations    Finish IVIG today ____________________________________________________________________   Pt seen by Neuro NP/APP and later by MD. Note/plan to be edited by MD as needed.    Lynnae January, DNP, AGACNP-BC [facility] Please use AMION for contact information & EPIC for messaging.  I have seen the patient and reviewed the above note.  With enhancing nerve roots, albuminocytologic dissociation, and time course, I think CIDP is likely, however ALS remains a possibility.  His negative inspiratory force has been low, but  stable.  Unfortunately he does not seem to have had much response to IVIG, though maximal benefit of this will not be for several weeks.  With CIDP, response to IVIG is usually determined after multiple repeat doses separated by about a month.  At this point, after discussion with Dr. Loleta Chance, would not favor further treatment with IV steroids but would plan to repeat IVIG per Dr. Loleta Chance as an outpatient.  Care moving forward at this time is supportive and he will likely need placement for rehab.  I discussed this with the patient's wife  Ritta Slot, MD [facility]   If 7pm- 7am, please page neurology on call as listed in AMION.

## 2023-08-08 ENCOUNTER — Telehealth: Payer: Self-pay | Admitting: Neurology

## 2023-08-08 DIAGNOSIS — Z7189 Other specified counseling: Secondary | ICD-10-CM

## 2023-08-08 DIAGNOSIS — G6181 Chronic inflammatory demyelinating polyneuritis: Secondary | ICD-10-CM | POA: Diagnosis not present

## 2023-08-08 DIAGNOSIS — R627 Adult failure to thrive: Secondary | ICD-10-CM

## 2023-08-08 DIAGNOSIS — E43 Unspecified severe protein-calorie malnutrition: Secondary | ICD-10-CM | POA: Diagnosis not present

## 2023-08-08 LAB — CBC WITH DIFFERENTIAL/PLATELET
Abs Immature Granulocytes: 0.02 10*3/uL (ref 0.00–0.07)
Basophils Absolute: 0 10*3/uL (ref 0.0–0.1)
Basophils Relative: 1 %
Eosinophils Absolute: 0.2 10*3/uL (ref 0.0–0.5)
Eosinophils Relative: 3 %
HCT: 36.7 % — ABNORMAL LOW (ref 39.0–52.0)
Hemoglobin: 12.1 g/dL — ABNORMAL LOW (ref 13.0–17.0)
Immature Granulocytes: 0 %
Lymphocytes Relative: 16 %
Lymphs Abs: 0.9 10*3/uL (ref 0.7–4.0)
MCH: 31.3 pg (ref 26.0–34.0)
MCHC: 33 g/dL (ref 30.0–36.0)
MCV: 95.1 fL (ref 80.0–100.0)
Monocytes Absolute: 0.9 10*3/uL (ref 0.1–1.0)
Monocytes Relative: 16 %
Neutro Abs: 3.6 10*3/uL (ref 1.7–7.7)
Neutrophils Relative %: 64 %
Platelets: 218 10*3/uL (ref 150–400)
RBC: 3.86 MIL/uL — ABNORMAL LOW (ref 4.22–5.81)
RDW: 15.3 % (ref 11.5–15.5)
WBC: 5.6 10*3/uL (ref 4.0–10.5)
nRBC: 0 % (ref 0.0–0.2)

## 2023-08-08 LAB — COMPREHENSIVE METABOLIC PANEL WITH GFR
ALT: 57 U/L — ABNORMAL HIGH (ref 0–44)
AST: 64 U/L — ABNORMAL HIGH (ref 15–41)
Albumin: 2.3 g/dL — ABNORMAL LOW (ref 3.5–5.0)
Alkaline Phosphatase: 85 U/L (ref 38–126)
Anion gap: 8 (ref 5–15)
BUN: 35 mg/dL — ABNORMAL HIGH (ref 8–23)
CO2: 24 mmol/L (ref 22–32)
Calcium: 8.7 mg/dL — ABNORMAL LOW (ref 8.9–10.3)
Chloride: 107 mmol/L (ref 98–111)
Creatinine, Ser: 0.63 mg/dL (ref 0.61–1.24)
GFR, Estimated: >60 mL/min (ref >60)
Glucose, Bld: 106 mg/dL — ABNORMAL HIGH (ref 70–99)
Potassium: 3.4 mmol/L — ABNORMAL LOW (ref 3.5–5.1)
Sodium: 139 mmol/L (ref 135–145)
Total Bilirubin: 0.6 mg/dL (ref 0.0–1.2)
Total Protein: 7 g/dL (ref 6.5–8.1)

## 2023-08-08 LAB — PHOSPHORUS: Phosphorus: 3.4 mg/dL (ref 2.5–4.6)

## 2023-08-08 LAB — MAGNESIUM: Magnesium: 2 mg/dL (ref 1.7–2.4)

## 2023-08-08 LAB — MISC LABCORP TEST (SEND OUT): Labcorp test code: 70115

## 2023-08-08 LAB — BRAIN NATRIURETIC PEPTIDE: B Natriuretic Peptide: 39.2 pg/mL (ref 0.0–100.0)

## 2023-08-08 MED ORDER — BOOST / RESOURCE BREEZE PO LIQD CUSTOM
1.0000 | Freq: Three times a day (TID) | ORAL | Status: DC
  Administered 2023-08-08 – 2023-08-09 (×3): 1 via ORAL

## 2023-08-08 MED ORDER — POTASSIUM CHLORIDE CRYS ER 20 MEQ PO TBCR
40.0000 meq | EXTENDED_RELEASE_TABLET | Freq: Once | ORAL | Status: AC
  Administered 2023-08-08: 40 meq via ORAL
  Filled 2023-08-08: qty 2

## 2023-08-08 NOTE — Plan of Care (Signed)

## 2023-08-08 NOTE — Progress Notes (Signed)
 Occupational Therapy Treatment Patient Details Name: Peter Stone MRN: [id number] DOB: [birth year] Today's Date: 08/08/2023   History of present illness Patient is an 83 yo male presenting to the ED with progressive weakness on 08/01/23. MRI of the lumbar spine showing enhancement of nerve roots concerning for inflammatory versus infectious versus low suspicion neoplastic process. Lumbar puncture on 08/02/23 positive for albuminocytogenic dissociation, pt being worked up for GBS. PMH includes: COPD, HTN, HLD, HOH, OSA.   OT comments  Pt making incremental progress in BUE function during HEP today with AAROM/gravity minimized positioning. LUE function > R UE function at this time but still unable to reach towards face requiring continued Total A for ADL mgmt. Pt unable to use standard call bell so located soft touch call bell with pt able to return demo use. Continue to recommend postacute rehab stay at [facility] as pt is significantly below functional baseline.       If plan is discharge home, recommend the following:  Two people to help with walking and/or transfers;Two people to help with bathing/dressing/bathroom;Assistance with cooking/housework;Assistance with feeding;Direct supervision/assist for medications management;Direct supervision/assist for financial management;Assist for transportation;Help with stairs or ramp for entrance;Supervision due to cognitive status   Equipment Recommendations  Hospital bed;Wheelchair cushion (measurements OT);Wheelchair (measurements OT);Hoyer lift    Recommendations for [employer]      Precautions / Restrictions Precautions Precautions: Fall Recall of Precautions/Restrictions: Intact Restrictions Weight Bearing Restrictions Per Provider Order: No       Mobility Bed Mobility Overal bed mobility: Needs Assistance             General bed mobility comments: Attempted long sitting in bed though Total A required    Transfers                          Balance                                           ADL either performed or assessed with clinical judgement   ADL Overall ADL's : Needs assistance/impaired Eating/Feeding: Total assistance;Bed level                                          Extremity/Trunk Assessment Upper Extremity Assessment Upper Extremity Assessment: Right hand dominant;RUE deficits/detail;LUE deficits/detail RUE Deficits / Details: sensation intact. when attempting AROM, pt tendency to hike shoulder up. AAROM for wrist flex/ext, elbow flex/ext (gravity minimized), able to complete lap slides on pillow. weaker than LUE RUE Sensation: WNL RUE Coordination: decreased fine motor;decreased gross motor LUE Deficits / Details: increased strength compared to RUE. AROM wrist flex/ext, able to extend digits and grasp. provided soft touch call bell with pt able to return demo squeezing. AAROM elbow flexion gravity minimized and against gravity well. unable to reach to face yet LUE Sensation: WNL LUE Coordination: decreased fine motor;decreased gross motor   Lower Extremity Assessment Lower Extremity Assessment: Defer to PT evaluation        Vision   Vision Assessment?: No apparent visual deficits   Perception     Praxis     Communication Communication Communication: Impaired Factors Affecting Communication: Hearing impaired   Cognition Arousal: Alert, Lethargic Behavior During Therapy: Flat affect, WFL for tasks assessed/performed Cognition: Difficult to assess  Difficult to assess due to: Hard of hearing/deaf   Awareness: Online awareness impaired, Intellectual awareness intact Memory impairment (select all impairments): Short-term memory Attention impairment (select first level of impairment): Selective attention, Sustained attention Executive functioning impairment (select all impairments): Sequencing, Problem solving OT - Cognition Comments: lethargic but  easily awakened and sustains alertness with activities. pleasant, following commands with increased time and variable cues.                 Following commands: Impaired Following commands impaired: Follows one step commands with increased time, Follows multi-step commands inconsistently      Cueing   Cueing Techniques: Verbal cues, Visual cues, Gestural cues  Exercises Exercises: Other exercises Other Exercises Other Exercises: AAROM BUE supported on pillow vs with therapist support at all joints.    Shoulder Instructions       General Comments      Pertinent Vitals/ Pain       Pain Assessment Pain Assessment: No/denies pain  Home Living                                          Prior Functioning/Environment              Frequency  Min 2X/week        Progress Toward Goals  OT Goals(current goals can now be found in the care plan section)  Progress towards OT goals: Progressing toward goals  Acute Rehab OT Goals Patient Stated Goal: improve strength OT Goal Formulation: With patient/family Time For Goal Achievement: 08/16/23 Potential to Achieve Goals: Fair ADL Goals Pt Will Perform Eating: with adaptive utensils;with min assist Pt Will Perform Grooming: with adaptive equipment;with min assist Pt Will Perform Lower Body Bathing: with mod assist;sitting/lateral leans;with adaptive equipment Pt Will Perform Lower Body Dressing: with mod assist;sitting/lateral leans;with adaptive equipment Pt Will Transfer to Toilet: with mod assist;with +2 assist;stand pivot transfer;bedside commode Pt Will Perform Toileting - Clothing Manipulation and hygiene: with mod assist;sitting/lateral leans;sit to/from stand Additional ADL Goal #1: Patient will be able to tolerate sitting EOB with up to min A for support and balance for 5 minutes as a precursor to OOB activity. Additional ADL Goal #2: Patient and caregiver will be knowledgeable in pressure relief  and positional changes to prevent sores and decrease stiffness.  Plan      Co-evaluation                 AM-PAC OT "6 Clicks" Daily Activity     Outcome Measure   Help from another person eating meals?: Total Help from another person taking care of personal grooming?: Total Help from another person toileting, which includes using toliet, bedpan, or urinal?: Total Help from another person bathing (including washing, rinsing, drying)?: Total Help from another person to put on and taking off regular upper body clothing?: Total Help from another person to put on and taking off regular lower body clothing?: Total 6 Click Score: 6    End of Session    OT Visit Diagnosis: Unsteadiness on feet (R26.81);Other abnormalities of gait and mobility (R26.89);Repeated falls (R29.6);Muscle weakness (generalized) (M62.81);History of falling (Z91.81);Feeding difficulties (R63.3);Other symptoms and signs involving cognitive function;Adult, failure to thrive (R62.7);Pain   Activity Tolerance Patient tolerated treatment well;Patient limited by fatigue   Patient Left in bed;with call bell/phone within reach;with bed alarm set   Nurse Communication Mobility status;Other (comment) (soft  touch call bell)        Time: 2952-8413 OT Time Calculation (min): 24 min  Charges: OT General Charges $OT Visit: 1 Visit OT Treatments $Therapeutic Exercise: 8-22 mins  Bradd Canary, OTR/L Acute Rehab Services Office: [phone]   Lorre Munroe 08/08/2023, 10:05 AM

## 2023-08-08 NOTE — Telephone Encounter (Signed)
 Dr. Thedore Mins wants to speak about Pt. With Dr. Loleta Chance left cell phone number to call [phone]

## 2023-08-08 NOTE — Progress Notes (Signed)
 NIF-25 VC-1.5L

## 2023-08-08 NOTE — Progress Notes (Signed)
 NEUROLOGY CONSULT FOLLOW UP NOTE   Date of service: August 08, 2023 Patient Name: Peter Stone MRN:  [id number] DOB:  Aug 31, 1940  Interval Hx/subjective   No acute events overnight. No family at bedside.   Generalized profound weakness continues. Seems drowsier than on yesterday's exam. Continues to follows commands with profound generalized weakness, BLE>BUE.   Vitals   Vitals:   08/07/23 1952 08/07/23 2300 08/08/23 0000 08/08/23 0400  BP: 125/84  (!) 140/99   Pulse: (!) 102  77   Resp: 20 20 20    Temp:   97.8 F (36.6 C) 97.6 F (36.4 C)  TempSrc:   Axillary Axillary  SpO2: 99%  96%   Weight:      Height:         Body mass index is 20.02 kg/m.  Physical Exam   Constitutional: Frail elderly male in no apparent distress Psych: Flat affect Eyes: No scleral injection.  HENT: No OP obstrucion.  Head: Normocephalic.  Cardiovascular: Normal rate and regular rhythm.  Respiratory: Effort normal, non-labored breathing.  GI: Soft.  No distension. There is no tenderness.  Skin: WDI.   Neurologic Examination   Neuro: Mental Status: Patient is drowsy but responsive, does fall asleep easily without repeated stimulation. Oriented to person, place, month, year, and situation.   Cranial Nerves: II-XII grossly intact, including strong shoulder shrug bilaterally with resistance strength bilaterally and hard of hearing at baseline. X: voice is hoarse, no issues noted with swallowing water on exam.  Motor: Tone is normal. Bulk is normal.  On upper extremities, the left side continues to be slightly stronger  Patient notes severe pain when legs are passively lifted off bed                                                  Right              Left  Shoulder Abduction               3/5                 4/5 Elbow Flexion                          2/5                  3/5 Elbow Extension                     2/5                   3/5 Wrist Flexion                           2/5                    3/5 Wrist Extension                       3/5                   4-/5 Grip  3/5                   4+/5   Hip Flexion                              1/5                   1/5   BLE exam limited by patient's pain with lifting Knee Flexion                           1/5                  2/5 Knee Extension                       1/5                  2/5 Ankle Dorsiflexion                   2/5                  3/5 Ankle Plantarflexion                2/5                  4-/5   Patient was unable to follow directions for single breath count test. He was able to take multiple deep breaths and exhale forcefully with no changes to oxygen saturation on monitor or any change in his breathing pattern. No change in breathing or oxygen saturation noted with continued conversation.     Medications  Current Facility-Administered Medications:    (feeding supplement) PROSource Plus liquid 30 mL, 30 mL, Oral, BID BM, Leroy Sea, MD, 30 mL at 08/07/23 1033   acetaminophen (TYLENOL) tablet 650 mg, 650 mg, Oral, Q6H PRN, 650 mg at 08/06/23 2103 **OR** acetaminophen (TYLENOL) suppository 650 mg, 650 mg, Rectal, Q6H PRN, Doutova, Anastassia, MD   acetaminophen (TYLENOL) tablet 650 mg, 650 mg, Oral, Q24H, Dessie, Habtemariam, MD, 650 mg at 08/08/23 0851   albuterol (PROVENTIL) (2.5 MG/3ML) 0.083% nebulizer solution 2.5 mg, 2.5 mg, Nebulization, Q2H PRN, Doutova, Anastassia, MD   Chlorhexidine Gluconate Cloth 2 % PADS 6 each, 6 each, Topical, Daily, Leroy Sea, MD, 6 each at 08/08/23 0852   enoxaparin (LOVENOX) injection 40 mg, 40 mg, Subcutaneous, Q24H, Ghimire, Shanker M, MD, 40 mg at 08/07/23 1033   hydrALAZINE (APRESOLINE) injection 10 mg, 10 mg, Intravenous, Q6H PRN, Leroy Sea, MD   HYDROcodone-acetaminophen (NORCO/VICODIN) 5-325 MG per tablet 1-2 tablet, 1-2 tablet, Oral, Q4H PRN, Adela Glimpse, Anastassia, MD, 1 tablet at 08/05/23 1808   lactose free  nutrition (BOOST PLUS) liquid 237 mL, 237 mL, Oral, TID WC, Ghimire, Shanker M, MD, 237 mL at 08/07/23 1614   lidocaine (XYLOCAINE) 2 % (with pres) injection 200 mg, 10 mL, Intradermal, Once, Doutova, Anastassia, MD   melatonin tablet 5 mg, 5 mg, Oral, QHS, Ghimire, Shanker M, MD, 5 mg at 08/07/23 2246   metoprolol tartrate (LOPRESSOR) injection 5 mg, 5 mg, Intravenous, Q8H PRN, Leroy Sea, MD   metoprolol tartrate (LOPRESSOR) tablet 50 mg, 50 mg, Oral, BID, Leroy Sea, MD, 50 mg at 08/08/23 7425   mirtazapine (REMERON) tablet 7.5 mg, 7.5 mg, Oral, QHS, Ghimire, Werner Lean, MD, 7.5 mg at  08/07/23 2246   multivitamin with minerals tablet 1 tablet, 1 tablet, Oral, Daily, Ghimire, Werner Lean, MD, 1 tablet at 08/08/23 0852   ondansetron (ZOFRAN) tablet 4 mg, 4 mg, Oral, Q6H PRN **OR** ondansetron (ZOFRAN) injection 4 mg, 4 mg, Intravenous, Q6H PRN, Doutova, Anastassia, MD   QUEtiapine (SEROQUEL) tablet 25 mg, 25 mg, Oral, Daily PRN, Maretta Bees, MD, 25 mg at 08/06/23 2102   tamsulosin (FLOMAX) capsule 0.4 mg, 0.4 mg, Oral, Daily, Ghimire, Shanker M, MD, 0.4 mg at 08/08/23 0852  Labs and Diagnostic Imaging   CBC:  Recent Labs  Lab 08/07/23 0426 08/08/23 0429  WBC 4.7 5.6  NEUTROABS 2.8 3.6  HGB 12.0* 12.1*  HCT 35.6* 36.7*  MCV 95.2 95.1  PLT 216 218    Basic Metabolic Panel:  Lab Results  Component Value Date   NA 139 08/08/2023   K 3.4 (L) 08/08/2023   CO2 24 08/08/2023   GLUCOSE 106 (H) 08/08/2023   BUN 35 (H) 08/08/2023   CREATININE 0.63 08/08/2023   CALCIUM 8.7 (L) 08/08/2023   GFRNONAA >60 08/08/2023   GFRAA  07/10/2008    >60        The eGFR has been calculated using the MDRD equation. This calculation has not been validated in all clinical situations. eGFR's persistently <60 mL/min signify possible Chronic Kidney Disease.   Lipid Panel: No results found for: "LDLCALC" HgbA1c: No results found for: "HGBA1C" Urine Drug Screen: No results found  for: "LABOPIA", "COCAINSCRNUR", "LABBENZ", "AMPHETMU", "THCU", "LABBARB"  Alcohol Level No results found for: "ETH" INR  Lab Results  Component Value Date   INR 1.0 08/02/2023   APTT No results found for: "APTT" AED levels: No results found for: "PHENYTOIN", "ZONISAMIDE", "LAMOTRIGINE", "LEVETIRACETA"  MRI Brain(Personally reviewed): No acute intracranial abnormality. Findings of chronic small vessel ischemia.   MRI L/T/C Spine: Substantial abnormal ventral nerve root enhancement in the cauda equina, with some low-level leptomeningeal enhancement along the anterior margin of the conus. Guillain-Barre syndrome is the most likely cause of this pattern. Small central disc protrusion at T7-8 without spinal canal stenosis. Cervical spondylosis and degenerative disc disease causing prominent impingement at C3-4 and C4-5; mild to moderate impingement at C2-3; and mild impingement at C6-7   LP 4/2: CSF Culture: Prelim negative VDRL: non reactive  Cell count: RBC 1, WBC 2 IgG CSF: 8.4 M/E Panel: Negative OC Bands: Pending Protein: 93 Glucose: 64 VZV PCR: Negative   Lyme Disease: Negative    Assessment   Peter Stone is a 83 y.o. male with PMH significant for HTN, OSA, HLD who presented 4/1 with progressive generalized weakness, proximal worse than distal, now developing BUE weakness and bulbar symptoms. 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 MRI L spine, however, also showed evidence of contrast enhancement seen in GBS.    Chart review does show decreased PO intake after a fall in February. He was seen by outpatient neurology for the progressive weakness on 3/21 where wife noted him slowing down over the last few years. Patient had a more acute decline after a viral illness in December 2024, with inability to stand a couple of weeks after that. PT was  ordered. Afib was noted by therapist, which is a new diagnosis for patient. He is scheduled for OP cardiology 4/14. EKGs this admission have shown SR with RBB and prolonged QT.    LP was completed 4/2 under fluoro. High  protein, consistent with albuminocytogenic dissociation. OG bands pending .    Albumin is low, as expected with his recent history of low PO intake. 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 Patient also takes remeron, which has a possible side effect of weakness and fatigue. However, patient just started on this medication 3/28 well after these symptoms started.    Recommendations   Agree with palliative care consult  ____________________________________________________________________   Pt seen by Neuro NP/APP and later by MD. Note/plan to be edited by MD as needed.    Lynnae January, DNP, AGACNP-BC [facility] Please use AMION for contact information & EPIC for messaging.  I have seen the patient and reviewed the above note.  With enhancing nerve roots, albuminocytologic dissociation, and time course, I think CIDP is likely, however ALS remains a possibility.  As discussed yesterday with Dr. Loleta Chance, steroids are unlikely to be beneficial.  If aggressive care is pursued, he will get monthly IVIG, but I think is very possible that he will not survive long enough to do this.   If aggressive care is pursued, may need to consider options such as PEG to ensure that he gets adequate nutrition.   Palliative care is consulted and agree that considering hospice and comfort care may be reasonable.  Ritta Slot, MD [facility]   If 7pm- 7am, please page neurology on call as listed in AMION.

## 2023-08-08 NOTE — Progress Notes (Signed)
 PROGRESS NOTE        PATIENT DETAILS Name: Peter Stone Age: 83 y.o. Sex: male Date of Birth: Sep 11, 1940 Admit Date: 08/01/2023 Admitting Physician Therisa Doyne, MD RUE:[id number], Sherie Don, MD  Brief Summary: Patient is a 83 y.o.  male with history of COPD, HTN, HLD, hard of hearing-who presented with progressive lower extremity weakness-(ongoing since Christmas-started using a walker)-but progressively worse over the past several weeks to the point that he has stopped ambulating.  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  Significant studies: 3/28>> MRI C-spine: Cervical spondylosis/degenerative disc disease. 3/28>> MRI L-spine: Ventral nerve root enhancement and cauda equina. 4/01>> MRI T-spine: Contrast enhancement of visualized nerve roots in the cauda equina. 4/01>> abdominal x-ray: Large stool burden.  Significant microbiology data: 4/01>> COVID/influenza/RSV PCR: Negative 4/02> CSF culture: No growth 4/02>> CSF meningitis/encephalitis panel: Negative 4/2>> CSF VDRL: Nonreactive 4/2>> CSF VZV PCR: Negative  Procedures: 4/02>> LP  Consults: Neurology, palliative care  Subjective:  Patient in bed, appears comfortable, denies any headache, no fever, no chest pain or pressure, no shortness of breath , no abdominal pain. No new focal weakness.    Objective: Vitals: Blood pressure (!) 140/99, pulse 77, temperature 97.6 F (36.4 C), temperature source Axillary, resp. rate 20, height 5\' 10"  (1.778 m), weight 63.3 kg, SpO2 96%.   Exam:  Awake Alert, No new F.N deficits, Normal affect, diffusely weak all over, strength 2/5 in all 4 extremities [place].AT,PERRAL Supple Neck, No JVD,   Symmetrical Chest wall  movement, Good air movement bilaterally, CTAB RRR,No Gallops, Rubs or new Murmurs,  +ve B.Sounds, Abd Soft, No tenderness,   No Cyanosis, Clubbing or edema    Assessment/Plan:  Progressive lower extremity>> upper extremity weakness CSF positive for albuminocytogenic dissociation-suspicion for CIDP CSF studies noted discussed with neurology in detail on 08/08/2023 Finished 5 days of IVIG treatment with limited response and improvement, still extremely weak and cachectic PT/OT CIR being contemplated Neurology following. NIF/VC being monitored by RT. Overall ++ weak, extremely frail and cachectic, poor cough reflex, poor oral intake, long-term prognosis does not look good.  Will involve palliative care for goals of care.   Delirium Delirium is on 4/2 No further delirium episodes overnight Delirium precautions As needed Seroquel Doing well on melatonin/Remeron.  COPD Stable Bronchodilators  HTN BP stable on low-dose amlodipine Per H&P-patient not taking HCTZ/lisinopril.    BPH As needed bladder scans Flomax  Hypokalemia Repleted  Hard of hearing  Failure to thrive/debility/poor appetite Poor appetite but per my conversation with spouse on 4/4-eating a bit more every day Continue Remeron-encourage oral intake-willy improving, neurology also contemplating to add IV steroids on 08/07/2023.  Continue to monitor oral intake.  Nutrition Status: Nutrition Problem: Severe Malnutrition Etiology: chronic illness (COPD and HTN exacerbated by acute event requiring hospitalization) Signs/Symptoms: severe fat depletion, severe muscle depletion, percent weight loss Interventions: Boost Plus, Magic cup, Refer to RD note for recommendations, MVI    Code status:   Code Status: Full Code   DVT Prophylaxis: enoxaparin (LOVENOX) injection 40 mg Start: 08/04/23 1115 SCDs Start: 08/01/23 2147   Family Communication:Spouse- Leia Alf [phone] -  on 08/07/2023 at 10:04 AM   Disposition  Plan: Status is: Inpatient  Remains inpatient appropriate because: Severity of illness   Planned Discharge Destination:Home   Diet: Diet Order             Diet regular Room service appropriate? Yes with Assist; Fluid consistency: Thin  Diet effective now                     Antimicrobial agents: Anti-infectives (From admission, onward)    None        MEDICATIONS: Scheduled Meds:  (feeding supplement) PROSource Plus  30 mL Oral BID BM   acetaminophen  650 mg Oral Q24H   Chlorhexidine Gluconate Cloth  6 each Topical Daily   enoxaparin (LOVENOX) injection  40 mg Subcutaneous Q24H   lactose free nutrition  237 mL Oral TID WC   lidocaine  10 mL Intradermal Once   melatonin  5 mg Oral QHS   metoprolol tartrate  50 mg Oral BID   mirtazapine  7.5 mg Oral QHS   multivitamin with minerals  1 tablet Oral Daily   tamsulosin  0.4 mg Oral Daily   Continuous Infusions:   PRN Meds:.acetaminophen **OR** acetaminophen, albuterol, hydrALAZINE, HYDROcodone-acetaminophen, metoprolol tartrate, ondansetron **OR** ondansetron (ZOFRAN) IV, QUEtiapine   I have personally reviewed following labs and imaging studies  LABORATORY DATA: CBC: Recent Labs  Lab 08/01/23 1350 08/02/23 0455 08/04/23 0501 08/05/23 0602 08/06/23 0555 08/07/23 0426 08/08/23 0429  WBC 7.2   < > 6.1 6.0 4.7 4.7 5.6  NEUTROABS 4.4  --   --   --  2.7 2.8 3.6  HGB 14.7   < > 12.9* 12.5* 12.4* 12.0* 12.1*  HCT 44.5   < > 37.5* 36.3* 37.1* 35.6* 36.7*  MCV 96.1   < > 93.5 94.3 95.6 95.2 95.1  PLT 253   < > 228 217 223 216 218   < > = values in this interval not displayed.    Basic Metabolic Panel: Recent Labs  Lab 08/02/23 0059 08/02/23 0455 08/04/23 0501 08/05/23 0602 08/06/23 0555 08/07/23 0426 08/08/23 0429  NA  --  140 138 140 141 140 139  K  --  3.8 3.3* 3.6 3.7 3.7 3.4*  CL  --  110 106 110 109 109 107  CO2  --  22 23 24 26 24 24   GLUCOSE  --  95 120* 115* 107* 112* 106*  BUN  --  36*  34* 28* 27* 28* 35*  CREATININE  --  0.60* 0.56* 0.59* 0.63 0.51* 0.63  CALCIUM  --  8.9 8.5* 8.8* 8.8* 8.6* 8.7*  MG 2.1 2.0  --   --  2.0 2.1 2.0  PHOS 3.3 3.4  --   --  2.9 3.1 3.4    GFR: Estimated Creatinine Clearance: 60.4 mL/min (by C-G formula based on SCr of 0.63 mg/dL).  Liver Function Tests: Recent Labs  Lab 08/01/23 1350 08/02/23 0455 08/02/23 1259 08/07/23 0426 08/08/23 0429  AST 38 33  --  40 64*  ALT 43 41  --  38 57*  ALKPHOS 113 98  --  89 85  BILITOT 1.0 1.0  --  0.6 0.6  PROT 5.8* 4.5*  --  6.5 7.0  ALBUMIN 3.0* 2.6* 3.1* 2.2* 2.3*   No results for input(s): "LIPASE", "AMYLASE" in the last 168 hours. No results for input(s): "AMMONIA" in the last 168 hours.  Coagulation Profile: Recent Labs  Lab 08/02/23 0059  INR 1.0    Cardiac Enzymes: Recent Labs  Lab 08/02/23 0059  CKTOTAL  60    BNP (last 3 results) No results for input(s): "PROBNP" in the last 8760 hours.  Lipid Profile: No results for input(s): "CHOL", "HDL", "LDLCALC", "TRIG", "CHOLHDL", "LDLDIRECT" in the last 72 hours.  Thyroid Function Tests: Recent Labs    08/06/23 0505 08/06/23 0555  TSH 0.625  --   FREET4  --  1.66*     Anemia Panel: No results for input(s): "VITAMINB12", "FOLATE", "FERRITIN", "TIBC", "IRON", "RETICCTPCT" in the last 72 hours.   Urine analysis:    Component Value Date/Time   COLORURINE YELLOW 06/23/2023 1435   APPEARANCEUR CLEAR 06/23/2023 1435   LABSPEC 1.013 06/23/2023 1435   PHURINE 5.0 06/23/2023 1435   GLUCOSEU NEGATIVE 06/23/2023 1435   HGBUR NEGATIVE 06/23/2023 1435   BILIRUBINUR NEGATIVE 06/23/2023 1435   KETONESUR 20 (A) 06/23/2023 1435   PROTEINUR NEGATIVE 06/23/2023 1435   NITRITE NEGATIVE 06/23/2023 1435   LEUKOCYTESUR NEGATIVE 06/23/2023 1435    Sepsis Labs: Lactic Acid, Venous No results found for: "LATICACIDVEN"  MICROBIOLOGY: Recent Results (from the past 240 hours)  Resp panel by RT-PCR (RSV, Flu A&B, Covid) Anterior  Nasal Swab     Status: None   Collection Time: 08/01/23  1:51 PM   Specimen: Anterior Nasal Swab  Result Value Ref Range Status   SARS Coronavirus 2 by RT PCR NEGATIVE NEGATIVE Final   Influenza A by PCR NEGATIVE NEGATIVE Final   Influenza B by PCR NEGATIVE NEGATIVE Final    Comment: (NOTE) The Xpert Xpress SARS-CoV-2/FLU/RSV plus assay is intended as an aid in the diagnosis of influenza from Nasopharyngeal swab specimens and should not be used as a sole basis for treatment. Nasal washings and aspirates are unacceptable for Xpert Xpress SARS-CoV-2/FLU/RSV testing.  Fact Sheet for Patients: BloggerCourse.com  Fact Sheet for Healthcare Providers: SeriousBroker.it  This test is not yet approved or cleared by the [place] FDA and has been authorized for detection and/or diagnosis of SARS-CoV-2 by FDA under an Emergency Use Authorization (EUA). This EUA will remain in effect (meaning this test can be used) for the duration of the COVID-19 declaration under Section 564(b)(1) of the Act, 21 U.S.C. section 360bbb-3(b)(1), unless the authorization is terminated or revoked.     Resp Syncytial Virus by PCR NEGATIVE NEGATIVE Final    Comment: (NOTE) Fact Sheet for Patients: BloggerCourse.com  Fact Sheet for Healthcare Providers: SeriousBroker.it  This test is not yet approved or cleared by the [place] FDA and has been authorized for detection and/or diagnosis of SARS-CoV-2 by FDA under an Emergency Use Authorization (EUA). This EUA will remain in effect (meaning this test can be used) for the duration of the COVID-19 declaration under Section 564(b)(1) of the Act, 21 U.S.C. section 360bbb-3(b)(1), unless the authorization is terminated or revoked.  Performed at [facility], [address] [place], [place] [address]   VZV PCR, CSF     Status: None   Collection Time:  08/02/23 12:59 PM   Specimen: Lumbar Puncture; Cerebrospinal Fluid  Result Value Ref Range Status   VZV PCR, CSF Negative Negative Final    Comment: (NOTE) No Varicella Zoster Virus DNA detected. Performed At: [facility] [address] [place], [place] [id number] Jolene Schimke MD MV:[id number]   CSF culture w Gram Stain     Status: None   Collection Time: 08/02/23 12:59 PM   Specimen: PATH Cytology CSF; Cerebrospinal Fluid  Result Value Ref Range Status   Specimen Description CSF  Final   Special Requests NONE  Final   Gram Stain   Final    WBC PRESENT, PREDOMINANTLY MONONUCLEAR NO ORGANISMS SEEN CYTOSPIN SMEAR    Culture   Final    NO GROWTH 3 DAYS Performed at [facility], [address], [place], [place] [address]    Report Status 08/05/2023 FINAL  Final    RADIOLOGY STUDIES/RESULTS: DG Chest Port 1 View Result Date: 08/06/2023 CLINICAL DATA:  Shortness of breath. EXAM: PORTABLE CHEST 1 VIEW COMPARISON:  08/01/2023 FINDINGS: The lungs are clear without focal pneumonia, edema, pneumothorax or pleural effusion. The cardiopericardial silhouette is within normal limits for size. No acute bony abnormality. Telemetry leads overlie the chest. IMPRESSION: No active disease. Electronically Signed   By: Kennith Center M.D.   On: 08/06/2023 12:15      LOS: 7 days   Signature  -    Susa Raring M.D on 08/08/2023 at 10:25 AM   -  To page go to [email or web address]

## 2023-08-08 NOTE — Consult Note (Addendum)
 Consultation Note Date: 08/08/2023   Patient Name: Peter Stone  DOB: 02-Mar-1941  MRN: [id number]  Age / Sex: 83 y.o., male  PCP: Emilio Aspen, MD Referring Physician: Leroy Sea, MD  Reason for Consultation:  severe diffuse weakness due to CDIP, extremely frail and cachetic, extremely poor oral intake. Poor prognosis. Goals of care.   HPI/Patient Profile: 83 y.o. male  with past medical history of COPD, and ongoing weakness since [place], admitted on 08/01/2023 with worsening weakness and deconditioning. He was instructed by his neurologist to proceed to ED for lumbar puncture and review of his MRI that showed concern for GBS. 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 He has completed initial treatment with IVIG. Palliative medicine consulted for above.    Primary Decision Maker HCPOA - on eval patient was unable to make decisions- [name] is dedicated as his HCPOA- document is on chart   Discussion: I have reviewed medical records including Care Everywhere, progress notes from this and prior admissions, labs and imaging, discussed with RN.  On evaluation patient is lethargic, hard of hearing. Unable to answer simple questions. Unable to participate in in depth goals of care discussion.   Met with his spouseByrd Hesselbach at bedside.   I introduced Palliative Medicine as specialized medical care for people living with serious illness. It focuses on providing relief from the symptoms and stress of a serious illness. The goal is to improve quality of life for both the patient and the family.  We discussed a brief life review of the patient. He is a [employer]. Worked for 45 years for [employer]. 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   As far as functional and nutritional status- prior to  admission he has had great decline. He has been bed bound since February. He is eating and drinking only bites and sips.  Labs notable for albumin 2.3, and hypokalemia 3.4 today likely due to poor intake. He is sleeping more than he is awake. He has been having some hallucinations- reports he thought he saw his cat in the room.   We discussed patient's current illness and what it means in the larger context of patient's on-going co-morbidities.  Natural disease trajectory and expectations at EOL were discussed.  We discussed that while the CIDP can be treated, the response to treatment can take a few weeks to have effect and there is concern that he won't be able to be restored to his previous level of functioning, due to his severe deconditioning.   I attempted to elicit values and goals of care important to the patient.  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   The difference between aggressive medical intervention and comfort care was discussed in light of the patient's goals of care. Discussed transition to comfort measures only which includes stopping IV  fluids, antibiotics, labs and providing symptom management for SOB, anxiety, nausea, vomiting, and other symptoms of dying. If comfort path chosen he would be eligible for hospice services.   Advance directives, concepts specific to code status, artificial feeding and hydration, and rehospitalization were considered and discussed. He has advanced directives and a living will. Would not want his life artificially prolonged. Discussed code status. Encouraged patient/family to consider DNR/DNI status understanding evidenced based poor outcomes in similar hospitalized patients, as the cause of the arrest is likely associated with chronic/terminal disease rather than a reversible acute cardio-pulmonary event. Byrd Hesselbach agrees with placement of DNR order.    Discussed with patient/family the importance of continued conversation with family and the medical providers regarding overall plan of care and treatment options, ensuring decisions are within the context of the patient's values and GOCs.    Questions and concerns were addressed. The family was encouraged to call with questions or concerns.      SUMMARY OF RECOMMENDATIONS -Failure to thrive in an adult in the setting of newly diagnosed CIDP s/p IVIG without current significant response -GOC- spouse considering continued aggressive treatment vs transition to comfort measures only and hospice care- she would like to discuss with patient's neurologist- Dr. Loleta Chance- I have sent a message requesting him to reach out to her -Code status- DNR    Code Status/Advance Care Planning:   Code Status: Limited: Do not attempt resuscitation (DNR) -DNR-LIMITED -Do Not Intubate/DNI     Prognosis:   Unable to determine - likely less than 2 weeks if comfort/hospice care path- I worry he is at less than 2 weeks regardless of path of care as he is displaying major changes in three significant domains of living -functional, nutritional, and cognitive   Discharge Planning: To Be Determined  Primary Diagnoses: Present on Admission:  GBS (Guillain Barre syndrome) (HCC)  COPD (chronic obstructive pulmonary disease) (HCC)  Essential hypertension   Review of Systems  Unable to perform ROS: Mental status change    Physical Exam Vitals and nursing note reviewed.  Constitutional:      Comments: Cachetic   Pulmonary:     Effort: Pulmonary effort is normal.  Musculoskeletal:     Comments: Diffuse muscle wasting  Neurological:     Comments: Somnolent, very hard of hearing     Vital Signs: BP (!) 140/99 (BP Location: Left Arm)   Pulse 77   Temp 97.6 F (36.4 C) (Axillary)   Resp 20   Ht 5\' 10"  (1.778 m)   Wt 63.3 kg   SpO2 96%   BMI 20.02 kg/m  Pain Scale: 0-10 POSS *See Group Information*:  S-Acceptable,Sleep, easy to arouse Pain Score: 0-No pain   SpO2: SpO2: 96 % O2 Device:SpO2: 96 % O2 Flow Rate: .   IO: Intake/output summary:  Intake/Output Summary (Last 24 hours) at 08/08/2023 1503 Last data filed at 08/08/2023 1100 Gross per 24 hour  Intake 55 ml  Output 800 ml  Net -745 ml    LBM: Last BM Date : 08/07/23 Baseline Weight: Weight: 63.3 kg Most recent weight: Weight: 63.3 kg       Thank you for this consult. Palliative medicine will continue to follow and assist as needed.  Time Total: 120 minutes Signed by: Ocie Bob, AGNP-C Palliative Medicine  Time includes:   Preparing to see the patient (e.g., review of tests) Obtaining and/or reviewing separately obtained history Performing a medically necessary appropriate examination and/or evaluation Counseling and educating the patient/family/caregiver Ordering medications, tests,  or procedures Referring and communicating with other health care professionals (when not reported separately) Documenting clinical information in the electronic or other health record Independently interpreting results (not reported separately) and communicating results to the patient/family/caregiver Care coordination (not reported separately) Clinical documentation   Please contact Palliative Medicine Team phone at [phone] for questions and concerns.  For individual provider: See Loretha Stapler

## 2023-08-08 NOTE — Progress Notes (Signed)
 Inpatient Rehabilitation Admissions Coordinator   Palliative consulting at bedside. I will follow.  Ottie Glazier, RN, MSN Rehab Admissions Coordinator [phone] 08/08/2023 2:35 PM

## 2023-08-08 NOTE — Telephone Encounter (Signed)
 Patient's wife had asked I call to discuss her husband's condition. I spoke with her this afternoon. She updated me that patient was still barely eating anything. She mentioned that she thought it was his way of saying he would not get better.  She asked that I explain his condition and prognosis. There was also confusion about using steroids for treatment. I told her that it was my decision to not give patient steroids as IVIg would be better and avoid the side effects of long term steroids. If patient has CIDP, which there is some evidence of (nerve root enhancement and elevated CSF protein), then the recovery is expected to be very slow based on recent EMG with significant axon loss changes. It is not surprising that there had not been much recovery to date. If he has CIDP, then perhaps the IVIg is helping the inflammatory attack on the nerves, but recovery would be slow and likely incomplete. The exact amount of recovery, if any, would only be known in time.  I explained 2 additional problems though. One is that patient is currently not eating and keeping up with his nutrition. This means the building blocks of recovery are not present. This would need to change if care was to continue to be aggressive. He would need to start eating, or more likely, would need a feeding tube. The other problem is that CIDP is a possible diagnosis. I am concerned that EMG did not show the expected findings for CIDP and may be more consistent with a neurodegenerative condition, like ALS for example. If wife wanted to be aggressive, we would treat with monthly IVIg and need better nutrition, likely feeding tube, and recovery would be slow.  After this discussion, wife stated she did not think the aggressive route was the way patient would want to go. She is leaning towards comfort care and hospice. She will think about it more, discuss with the inpatient team, and let them and I know if she changes her mind.  All questions  were answered and my condolences were offered for a very difficult situation.  Jacquelyne Balint, MD [facility]

## 2023-08-08 NOTE — Progress Notes (Signed)
 NIF -28 VC- 1.6L Good effort

## 2023-08-08 NOTE — Progress Notes (Addendum)
 Nutrition Follow-up  DOCUMENTATION CODES:   Severe malnutrition in context of chronic illness  INTERVENTION:  Continue Boost Plus po TID, each supplement provides 360 kcal and 14g protein Continue Vanilla Magic cup TID with meals, each supplement provides 290 kcal and 9 grams of protein  Continue adding cola and grape juice to meal trays, to increase calorie intake Continue MVI w/ minerals  Collect new weight to assess trend Monitor for GOC outcome  NUTRITION DIAGNOSIS:  Severe Malnutrition related to chronic illness (COPD and HTN exacerbated by acute event requiring hospitalization) as evidenced by severe fat depletion, severe muscle depletion, percent weight loss. - remains applicable  GOAL:  Patient will meet greater than or equal to 90% of their needs - progressing  MONITOR:  PO intake, Supplement acceptance  REASON FOR ASSESSMENT:  Consult Assessment of nutrition requirement/status  ASSESSMENT:  Pt with PMH: COPD, HLD, HTN. Admitted for dehydration and anorexia. Reports progressively worse LE weakness since December.  04/2023 viral infection w/ progressive weakness 2/21 ED visit d/t fall at home 3/31 presented to [facility] at recommendation of patient's neurologist, Dr. Loleta Chance 3/28 MRI C- and L-spine: cervical spondylosis/DDD and ventra nerve root enhancment and cauda equina 4/1 admitted to [facility] 4/1 MRI T-spine: nerve roots in cauda equina; abdominal xray: large stool burden 4/2 CSF panel: all negative/unreactive 4/2 advanced to regular diet 4/3 IVIG started  Pt has completed IVIG treatment with limited response, per MD note (4/7). Remains extremely frail and cachectic in appearance.  Pt resting comfortably during bedside visit today. No family at bedside. Pursuing CIR. Palliative care has been consulted for GOC discussion.   Average Meal Intake 4/3: 15-25% x2 documented meals 4/6: 10-50% x3 documented meals 4/7: 0-10% x3 documented meals  Appetite remains poor. Per MD note  (4/8), spouse is reporting he is consuming more every day. He is taking 2-3 Boost Plus offerings per day. Consuming two per day and averaging one Prosource offering, he is meeting approximately 46% of his estimated calorie needs and 51% of his estimated protein needs. Given the variability of his intake, he would likely benefit from enteral nutrition support, if within goals of care, to supplement the rest of his estimated needs. IV steroids may be initiated, pending neurology input. This may help increase patient's appetite.    Admit Weight: 63.3kg  No new weight to review. Order entered to collect new weight to assess trend. No skin breakdown noted. No edema noted.  Pt started on appetite stimulant on 4/3. MD considering initiation of steroids as well. Neurology input needed. Potassium supplementation was required. Vitamin E (gamma) low based off lab values collected 4/1.   Drains/Lines: Foley catheter UOP: 1.1L x24 hours  Meds: melatonin, mirtazapine, KCl   Labs: Na+ 139>140 (wdl) K+ 3.7>3.4 (L) TSH 1.66 (H) CBGs 106-112 x24 hours Copper 126 (wdl) CRP 0.7 (wdl) Vitamin B1 119.4 (wdl) Vitamin D 67.52 (wdl) Vitamin B12 612 (wdl) Vitamin B6 17.2 (wdl) Vitamin E (alpha) 18.2 (wdl) Vitamin E (gamma) 0.2 (L) Zinc 100 (wdl)   Diet Order:   Diet Order             Diet regular Room service appropriate? Yes with Assist; Fluid consistency: Thin  Diet effective now             EDUCATION NEEDS:   Education needs have been addressed  Skin:  Skin Assessment: Reviewed RN Assessment  Last BM:  4/1 - PTA  Height:  Ht Readings from Last 1 Encounters:  08/02/23 5\' 10"  (  1.778 m)   Weight:  Wt Readings from Last 1 Encounters:  08/02/23 63.3 kg   Ideal Body Weight:     BMI:  Body mass index is 20.02 kg/m.  Estimated Nutritional Needs:   Kcal:  1800-2000kcal  Protein:  85-100g  Fluid:  >1.8L/day  Myrtie Cruise MS, RD, LDN Registered Dietitian Clinical Nutrition RD  Inpatient Contact Info in Amion

## 2023-08-09 DIAGNOSIS — Z515 Encounter for palliative care: Secondary | ICD-10-CM

## 2023-08-09 DIAGNOSIS — I4891 Unspecified atrial fibrillation: Secondary | ICD-10-CM | POA: Diagnosis not present

## 2023-08-09 DIAGNOSIS — R9089 Other abnormal findings on diagnostic imaging of central nervous system: Secondary | ICD-10-CM | POA: Diagnosis not present

## 2023-08-09 DIAGNOSIS — G610 Guillain-Barre syndrome: Secondary | ICD-10-CM | POA: Diagnosis not present

## 2023-08-09 DIAGNOSIS — R531 Weakness: Secondary | ICD-10-CM | POA: Diagnosis not present

## 2023-08-09 LAB — CBC WITH DIFFERENTIAL/PLATELET
Abs Immature Granulocytes: 0.02 10*3/uL (ref 0.00–0.07)
Basophils Absolute: 0 10*3/uL (ref 0.0–0.1)
Basophils Relative: 0 %
Eosinophils Absolute: 0 10*3/uL (ref 0.0–0.5)
Eosinophils Relative: 0 %
HCT: 39.3 % (ref 39.0–52.0)
Hemoglobin: 13 g/dL (ref 13.0–17.0)
Immature Granulocytes: 0 %
Lymphocytes Relative: 8 %
Lymphs Abs: 0.7 10*3/uL (ref 0.7–4.0)
MCH: 31.6 pg (ref 26.0–34.0)
MCHC: 33.1 g/dL (ref 30.0–36.0)
MCV: 95.4 fL (ref 80.0–100.0)
Monocytes Absolute: 1 10*3/uL (ref 0.1–1.0)
Monocytes Relative: 12 %
Neutro Abs: 7 10*3/uL (ref 1.7–7.7)
Neutrophils Relative %: 80 %
Platelets: 218 10*3/uL (ref 150–400)
RBC: 4.12 MIL/uL — ABNORMAL LOW (ref 4.22–5.81)
RDW: 15.5 % (ref 11.5–15.5)
WBC: 8.8 10*3/uL (ref 4.0–10.5)
nRBC: 0 % (ref 0.0–0.2)

## 2023-08-09 LAB — COMPREHENSIVE METABOLIC PANEL WITH GFR
ALT: 61 U/L — ABNORMAL HIGH (ref 0–44)
AST: 63 U/L — ABNORMAL HIGH (ref 15–41)
Albumin: 2.5 g/dL — ABNORMAL LOW (ref 3.5–5.0)
Alkaline Phosphatase: 92 U/L (ref 38–126)
Anion gap: 8 (ref 5–15)
BUN: 34 mg/dL — ABNORMAL HIGH (ref 8–23)
CO2: 23 mmol/L (ref 22–32)
Calcium: 8.8 mg/dL — ABNORMAL LOW (ref 8.9–10.3)
Chloride: 108 mmol/L (ref 98–111)
Creatinine, Ser: 0.6 mg/dL — ABNORMAL LOW (ref 0.61–1.24)
GFR, Estimated: >60 mL/min (ref >60)
Glucose, Bld: 117 mg/dL — ABNORMAL HIGH (ref 70–99)
Potassium: 3.9 mmol/L (ref 3.5–5.1)
Sodium: 139 mmol/L (ref 135–145)
Total Bilirubin: 0.7 mg/dL (ref 0.0–1.2)
Total Protein: 7.3 g/dL (ref 6.5–8.1)

## 2023-08-09 LAB — MISC LABCORP TEST (SEND OUT): Labcorp test code: 140385

## 2023-08-09 LAB — MAGNESIUM: Magnesium: 2.1 mg/dL (ref 1.7–2.4)

## 2023-08-09 LAB — PHOSPHORUS: Phosphorus: 4 mg/dL (ref 2.5–4.6)

## 2023-08-09 LAB — BRAIN NATRIURETIC PEPTIDE: B Natriuretic Peptide: 42 pg/mL (ref 0.0–100.0)

## 2023-08-09 MED ORDER — HALOPERIDOL LACTATE 2 MG/ML PO CONC: 0.5000 mg | ORAL | Status: DC | PRN

## 2023-08-09 MED ORDER — BIOTENE DRY MOUTH MT LIQD: 15.0000 mL | OROMUCOSAL | Status: DC | PRN

## 2023-08-09 MED ORDER — GLYCOPYRROLATE 0.2 MG/ML IJ SOLN: 0.2000 mg | INTRAMUSCULAR | Status: DC | PRN

## 2023-08-09 MED ORDER — LORAZEPAM 2 MG/ML IJ SOLN
1.0000 mg | Freq: Four times a day (QID) | INTRAMUSCULAR | Status: DC
  Administered 2023-08-09 – 2023-08-10 (×6): 1 mg via INTRAVENOUS
  Filled 2023-08-09 (×6): qty 1

## 2023-08-09 MED ORDER — HALOPERIDOL LACTATE 5 MG/ML IJ SOLN: 0.5000 mg | INTRAMUSCULAR | Status: DC | PRN

## 2023-08-09 MED ORDER — LORAZEPAM 2 MG/ML PO CONC: 1.0000 mg | ORAL | Status: DC | PRN

## 2023-08-09 MED ORDER — LORAZEPAM 1 MG PO TABS: 1.0000 mg | ORAL_TABLET | ORAL | Status: DC | PRN

## 2023-08-09 MED ORDER — SODIUM CHLORIDE 0.9% FLUSH: 3.0000 mL | INTRAVENOUS | Status: DC | PRN

## 2023-08-09 MED ORDER — POLYVINYL ALCOHOL 1.4 % OP SOLN: 1.0000 [drp] | Freq: Four times a day (QID) | OPHTHALMIC | Status: DC | PRN

## 2023-08-09 MED ORDER — HALOPERIDOL 0.5 MG PO TABS: 0.5000 mg | ORAL_TABLET | ORAL | Status: DC | PRN

## 2023-08-09 MED ORDER — SODIUM CHLORIDE 0.9% FLUSH
3.0000 mL | Freq: Two times a day (BID) | INTRAVENOUS | Status: DC
  Administered 2023-08-09 – 2023-08-10 (×3): 10 mL via INTRAVENOUS

## 2023-08-09 MED ORDER — MORPHINE SULFATE (PF) 2 MG/ML IV SOLN
1.0000 mg | INTRAVENOUS | Status: DC | PRN
  Administered 2023-08-10: 4 mg via INTRAVENOUS
  Filled 2023-08-09: qty 2

## 2023-08-09 MED ORDER — LORAZEPAM 2 MG/ML IJ SOLN: 1.0000 mg | INTRAMUSCULAR | Status: DC | PRN

## 2023-08-09 MED ORDER — GLYCOPYRROLATE 1 MG PO TABS: 1.0000 mg | ORAL_TABLET | ORAL | Status: DC | PRN

## 2023-08-09 MED FILL — Immune Globulin (Human) IV Soln 5 GM/50ML: INTRAVENOUS | Qty: 50 | Status: AC

## 2023-08-09 MED FILL — Immune Globulin (Human) IV Soln 20 GM/200ML: INTRAVENOUS | Qty: 200 | Status: AC

## 2023-08-09 NOTE — TOC Progression Note (Signed)
 Transition of Care [facility]) - Progression Note    Patient Details  Name: Peter Stone MRN: [id number] Date of Birth: [birth year]  Transition of Care [facility]) CM/SW Contact  Mearl Latin, LCSW Phone Number: 08/09/2023, 1:01 PM  Clinical Narrative:    CSW received Hospice consult from MD. CSW spoke with patient's spouse and confirmed that she is requesting [facility] as that is the one she is familiar with. CSW sent referral to [facility] for review.    Expected Discharge Plan: Hospice Medical Facility Barriers to Discharge: Hospice Bed not available  Expected Discharge Plan and Services In-house Referral: Clinical Social Work, Hospice / Palliative Care     Living arrangements for the past 2 months: Single Family Home                                       Social Determinants of Health (SDOH) Interventions SDOH Screenings   Food Insecurity: No Food Insecurity (08/01/2023)  Housing: Low Risk  (08/01/2023)  Transportation Needs: No Transportation Needs (08/01/2023)  Utilities: Not At Risk (08/01/2023)  Social Connections: Moderately Isolated (08/01/2023)  Tobacco Use: Low Risk  (08/01/2023)    Readmission Risk Interventions     No data to display

## 2023-08-09 NOTE — Progress Notes (Signed)
 PROGRESS NOTE        PATIENT DETAILS Name: Peter Stone Age: 83 y.o. Sex: male Date of Birth: February 26, 1941 Admit Date: 08/01/2023 Admitting Physician Therisa Doyne, MD [id number], Sherie Don, MD  Brief Summary: Patient is a 83 y.o.  male with history of COPD, HTN, HLD, hard of hearing-who presented with progressive lower extremity weakness-(ongoing since Christmas-started using a walker)-but progressively worse over the past several weeks to the point that he has stopped ambulating.  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  Significant studies: 3/28>> MRI C-spine: Cervical spondylosis/degenerative disc disease. 3/28>> MRI L-spine: Ventral nerve root enhancement and cauda equina. 4/01>> MRI T-spine: Contrast enhancement of visualized nerve roots in the cauda equina. 4/01>> abdominal x-ray: Large stool burden.  Significant microbiology data: 4/01>> COVID/influenza/RSV PCR: Negative 4/02> CSF culture: No growth 4/02>> CSF meningitis/encephalitis panel: Negative 4/2>> CSF VDRL: Nonreactive 4/2>> CSF VZV PCR: Negative  Procedures: 4/02>> LP  Consults: Neurology, palliative care  Subjective: Patient in bed, appears comfortable, denies any headache, no fever, no chest pain or pressure, no shortness of breath , no abdominal pain. No focal weakness.    Objective: Vitals: Blood pressure (!) 151/80, pulse 94, temperature 98.1 F (36.7 C), temperature source Oral, resp. rate 20, height 5\' 10"  (1.778 m), weight 63.3 kg, SpO2 98%.   Exam:  Awake Alert, No new F.N deficits, Normal affect, diffusely weak all over, strength 2/5 in all 4 extremities [place].AT,PERRAL Supple Neck, No JVD,   Symmetrical Chest wall movement,  Good air movement bilaterally, CTAB RRR,No Gallops, Rubs or new Murmurs,  +ve B.Sounds, Abd Soft, No tenderness,   No Cyanosis, Clubbing or edema    Assessment/Plan:  Progressive lower extremity>> upper extremity weakness CSF positive for albuminocytogenic dissociation-suspicion for CIDP CSF studies noted discussed with neurology in detail on 08/08/2023 Finished 5 days of IVIG treatment with limited response and improvement, still extremely weak and cachectic PT/OT CIR being contemplated Neurology following. NIF/VC being monitored by RT. Overall ++ weak, extremely frail and cachectic, poor cough reflex, poor oral intake, long-term prognosis does not look good.    Seen by palliative care, also involved patient's primary neurologist Dr. Jacquelyne Balint, long discussion between patient's wife, inpatient and outpatient neurology team and me along with palliative care, decided that patient will be best served with full comfort measures preferably at a residential hospice.  We will start the process.   Delirium Delirium is on 4/2 No further delirium episodes overnight Delirium precautions As needed Seroquel Doing well on melatonin/Remeron.  COPD Stable Bronchodilators  HTN BP stable on low-dose amlodipine Per H&P-patient not taking HCTZ/lisinopril.    BPH As needed bladder scans Flomax  Hypokalemia Repleted  Hard of hearing  Failure to thrive/debility/poor appetite Poor appetite but per my conversation with spouse on 4/4-eating a bit more every day Continue Remeron-encourage oral intake-willy improving, neurology also contemplating to add IV steroids on 08/07/2023.  Continue to monitor oral intake.  Nutrition Status: Nutrition Problem: Severe Malnutrition Etiology: chronic illness (COPD and HTN exacerbated by acute event requiring hospitalization) Signs/Symptoms: severe fat depletion, severe muscle depletion, percent weight loss Interventions: Boost Plus, Magic cup, Refer to RD  note for recommendations, MVI    Code status:  Code Status: Limited: Do not attempt resuscitation (DNR) -DNR-LIMITED -Do Not Intubate/DNI    DVT Prophylaxis: enoxaparin (LOVENOX) injection 40 mg Start: 08/04/23 1115 SCDs Start: 08/01/23 2147   Family Communication:  Spouse- Leia Alf [phone] -  on 08/07/2023 at 10:04 AM, updated 08/09/2023   Disposition Plan: Status is: Inpatient Remains inpatient appropriate because: Severity of illness   Planned Discharge Destination:Home   Diet: Diet Order             Diet regular Room service appropriate? Yes with Assist; Fluid consistency: Thin  Diet effective now                     Antimicrobial agents: Anti-infectives (From admission, onward)    None        MEDICATIONS: Scheduled Meds:  (feeding supplement) PROSource Plus  30 mL Oral BID BM   acetaminophen  650 mg Oral Q24H   Chlorhexidine Gluconate Cloth  6 each Topical Daily   enoxaparin (LOVENOX) injection  40 mg Subcutaneous Q24H   feeding supplement  1 Container Oral TID BM   lactose free nutrition  237 mL Oral TID WC   lidocaine  10 mL Intradermal Once   melatonin  5 mg Oral QHS   metoprolol tartrate  50 mg Oral BID   mirtazapine  7.5 mg Oral QHS   multivitamin with minerals  1 tablet Oral Daily   tamsulosin  0.4 mg Oral Daily   Continuous Infusions:   PRN Meds:.acetaminophen **OR** acetaminophen, albuterol, hydrALAZINE, HYDROcodone-acetaminophen, metoprolol tartrate, ondansetron **OR** ondansetron (ZOFRAN) IV, QUEtiapine   I have personally reviewed following labs and imaging studies  LABORATORY DATA: CBC: Recent Labs  Lab 08/05/23 0602 08/06/23 0555 08/07/23 0426 08/08/23 0429 08/09/23 0436  WBC 6.0 4.7 4.7 5.6 8.8  NEUTROABS  --  2.7 2.8 3.6 7.0  HGB 12.5* 12.4* 12.0* 12.1* 13.0  HCT 36.3* 37.1* 35.6* 36.7* 39.3  MCV 94.3 95.6 95.2 95.1 95.4  PLT 217 223 216 218 218    Basic Metabolic Panel: Recent Labs  Lab 08/05/23 0602  08/06/23 0555 08/07/23 0426 08/08/23 0429 08/09/23 0436  NA 140 141 140 139 139  K 3.6 3.7 3.7 3.4* 3.9  CL 110 109 109 107 108  CO2 24 26 24 24 23   GLUCOSE 115* 107* 112* 106* 117*  BUN 28* 27* 28* 35* 34*  CREATININE 0.59* 0.63 0.51* 0.63 0.60*  CALCIUM 8.8* 8.8* 8.6* 8.7* 8.8*  MG  --  2.0 2.1 2.0 2.1  PHOS  --  2.9 3.1 3.4 4.0    GFR: Estimated Creatinine Clearance: 60.4 mL/min (A) (by C-G formula based on SCr of 0.6 mg/dL (L)).  Liver Function Tests: Recent Labs  Lab 08/02/23 1259 08/07/23 0426 08/08/23 0429 08/09/23 0436  AST  --  40 64* 63*  ALT  --  38 57* 61*  ALKPHOS  --  89 85 92  BILITOT  --  0.6 0.6 0.7  PROT  --  6.5 7.0 7.3  ALBUMIN 3.1* 2.2* 2.3* 2.5*   No results for input(s): "LIPASE", "AMYLASE" in the last 168 hours. No results for input(s): "AMMONIA" in the last 168 hours.   Urine analysis:    Component Value Date/Time   COLORURINE YELLOW 06/23/2023 1435   APPEARANCEUR CLEAR 06/23/2023 1435   LABSPEC 1.013 06/23/2023 1435   PHURINE 5.0 06/23/2023 1435   GLUCOSEU NEGATIVE 06/23/2023 1435   HGBUR NEGATIVE 06/23/2023 1435   BILIRUBINUR NEGATIVE 06/23/2023 1435   KETONESUR 20 (A) 06/23/2023  1435   PROTEINUR NEGATIVE 06/23/2023 1435   NITRITE NEGATIVE 06/23/2023 1435   LEUKOCYTESUR NEGATIVE 06/23/2023 1435    Sepsis Labs: Lactic Acid, Venous No results found for: "LATICACIDVEN"  MICROBIOLOGY: Recent Results (from the past 240 hours)  Resp panel by RT-PCR (RSV, Flu A&B, Covid) Anterior Nasal Swab     Status: None   Collection Time: 08/01/23  1:51 PM   Specimen: Anterior Nasal Swab  Result Value Ref Range Status   SARS Coronavirus 2 by RT PCR NEGATIVE NEGATIVE Final   Influenza A by PCR NEGATIVE NEGATIVE Final   Influenza B by PCR NEGATIVE NEGATIVE Final    Comment: (NOTE) The Xpert Xpress SARS-CoV-2/FLU/RSV plus assay is intended as an aid in the diagnosis of influenza from Nasopharyngeal swab specimens and should not be used as a  sole basis for treatment. Nasal washings and aspirates are unacceptable for Xpert Xpress SARS-CoV-2/FLU/RSV testing.  Fact Sheet for Patients: BloggerCourse.com  Fact Sheet for Healthcare Providers: SeriousBroker.it  This test is not yet approved or cleared by the [place] FDA and has been authorized for detection and/or diagnosis of SARS-CoV-2 by FDA under an Emergency Use Authorization (EUA). This EUA will remain in effect (meaning this test can be used) for the duration of the COVID-19 declaration under Section 564(b)(1) of the Act, 21 U.S.C. section 360bbb-3(b)(1), unless the authorization is terminated or revoked.     Resp Syncytial Virus by PCR NEGATIVE NEGATIVE Final    Comment: (NOTE) Fact Sheet for Patients: BloggerCourse.com  Fact Sheet for Healthcare Providers: SeriousBroker.it  This test is not yet approved or cleared by the [place] FDA and has been authorized for detection and/or diagnosis of SARS-CoV-2 by FDA under an Emergency Use Authorization (EUA). This EUA will remain in effect (meaning this test can be used) for the duration of the COVID-19 declaration under Section 564(b)(1) of the Act, 21 U.S.C. section 360bbb-3(b)(1), unless the authorization is terminated or revoked.  Performed at [facility], [address], [place], [place] [address]   VZV PCR, CSF     Status: None   Collection Time: 08/02/23 12:59 PM   Specimen: Lumbar Puncture; Cerebrospinal Fluid  Result Value Ref Range Status   VZV PCR, CSF Negative Negative Final    Comment: (NOTE) No Varicella Zoster Virus DNA detected. Performed At: [facility] [address] [place], [place] [id number] Jolene Schimke MD VO:[id number]   CSF culture w Gram Stain     Status: None   Collection Time: 08/02/23 12:59 PM   Specimen: PATH Cytology CSF; Cerebrospinal Fluid  Result  Value Ref Range Status   Specimen Description CSF  Final   Special Requests NONE  Final   Gram Stain   Final    WBC PRESENT, PREDOMINANTLY MONONUCLEAR NO ORGANISMS SEEN CYTOSPIN SMEAR    Culture   Final    NO GROWTH 3 DAYS Performed at [facility], [address] [place], [place] [address]    Report Status 08/05/2023 FINAL  Final    RADIOLOGY STUDIES/RESULTS: No results found.     LOS: 8 days   Signature  -    Susa Raring M.D on 08/09/2023 at 11:02 AM   -  To page go to [email or web address]

## 2023-08-09 NOTE — Progress Notes (Addendum)
 Inpatient Rehabilitation Admissions Coordinator   Noted ongoing discussions with palliative as well as Dr Loleta Chance. I will follow from a distance as wife makes her decisions on their goals.  Ottie Glazier, RN, MSN Rehab Admissions Coordinator [phone] 08/09/2023 10:50 AM  We will sign off.  Ottie Glazier, RN, MSN Rehab Admissions Coordinator [phone] 08/09/2023 1:34 PM

## 2023-08-09 NOTE — TOC Progression Note (Signed)
 Transition of Care [facility]) - Progression Note    Patient Details  Name: Peter Stone MRN: [id number] Date of Birth: 1941-03-31  Transition [facility]) CM/SW Contact  Mearl Latin, LCSW Phone Number: 08/09/2023, 9:39 AM  Clinical Narrative:    CSW continuing to follow.   Expected Discharge Plan: Skilled Nursing Facility Barriers to Discharge: Continued Medical Work up, English as a second language teacher, SNF Pending bed offer  Expected Discharge Plan and Services In-house Referral: Clinical Social Work     Living arrangements for the past 2 months: Single Family Home                                       Social Determinants of Health (SDOH) Interventions SDOH Screenings   Food Insecurity: No Food Insecurity (08/01/2023)  Housing: Low Risk  (08/01/2023)  Transportation Needs: No Transportation Needs (08/01/2023)  Utilities: Not At Risk (08/01/2023)  Social Connections: Moderately Isolated (08/01/2023)  Tobacco Use: Low Risk  (08/01/2023)    Readmission Risk Interventions     No data to display

## 2023-08-09 NOTE — Plan of Care (Signed)
 Pt has rested quietly throughout the night with no distress noted. At beginning of shift pt was very lethargic but would arouse for a second and go right back to sleep. He woke up around time for meds and was oriented to self and carrying on a conversation although he was confused. He thought he was leaving at 9pm. On room air. SR on the monitor. Foley intact with amber urine. Not eating and did drink some water with meds and asked for more, which was given. No complaints voiced.     Problem: Clinical Measurements: Goal: Respiratory complications will improve Outcome: Progressing Goal: Cardiovascular complication will be avoided Outcome: Progressing   Problem: Nutrition: Goal: Adequate nutrition will be maintained Outcome: Not Progressing   Problem: Coping: Goal: Level of anxiety will decrease Outcome: Progressing   Problem: Elimination: Goal: Will not experience complications related to urinary retention Outcome: Progressing   Problem: Pain Managment: Goal: General experience of comfort will improve and/or be controlled Outcome: Progressing

## 2023-08-09 NOTE — Progress Notes (Signed)
 NEUROLOGY CONSULT FOLLOW UP NOTE   Date of service: August 09, 2023 Patient Name: Peter Stone MRN:  [id number] DOB:  Jan 26, 1941  Interval Hx/subjective   On exam this morning, patient is tachypneic with abdominal, irregular breathing. Discussed code status again wth patient, with Dr. Amada Jupiter, at bedside. Patient confirmed that he would not want a breathing tube.   Vitals   Vitals:   08/09/23 0300 08/09/23 0500 08/09/23 0600 08/09/23 0800  BP:    (!) 151/80  Pulse:    94  Resp: 18 18 20 20   Temp:    98.1 F (36.7 C)  TempSrc:    Oral  SpO2:    98%  Weight:      Height:         Body mass index is 20.02 kg/m.  Physical Exam   Constitutional: Frail elderly male in no apparent distress Psych: Flat affect Eyes: No scleral injection.  HENT: No OP obstrucion.  Head: Normocephalic.  Cardiovascular: Normal rate and regular rhythm.  Respiratory: Effort normal, non-labored breathing.  GI: Soft.  No distension. There is no tenderness.  Skin: WDI.   Neurologic Examination   Neuro: Mental Status: Patient is awake and interactive. He is out of breath easily with .   Cranial Nerves: II-XII grossly intact, including strong shoulder shrug bilaterally with resistance strength bilaterally and hard of hearing at baseline.  Motor: Tone is normal. Bulk is normal.  On upper extremities, the left side continues to be slightly stronger  Patient notes severe pain when legs are passively lifted off bed                                                  Right              Left  Shoulder Abduction               3/5                 4/5 Elbow Flexion                          2/5                  3/5 Elbow Extension                     2/5                   3/5 Wrist Flexion                           2/5                   3/5 Wrist Extension                       3/5                   4-/5 Grip                                         3/5  4+/5   Hip Flexion                               1/5                   1/5   BLE exam limited by patient's pain with lifting Knee Flexion                           1/5                  2/5 Knee Extension                       1/5                  2/5 Ankle Dorsiflexion                   2/5                  3/5 Ankle Plantarflexion                2/5                  4-/5   Patient was unable to follow directions for single breath count test. He was able to take multiple deep breaths and exhale forcefully with no changes to oxygen saturation on monitor or any change in his breathing pattern. No change in breathing or oxygen saturation noted with continued conversation.     Medications  Current Facility-Administered Medications:    (feeding supplement) PROSource Plus liquid 30 mL, 30 mL, Oral, BID BM, Leroy Sea, MD, 30 mL at 08/09/23 1029   acetaminophen (TYLENOL) tablet 650 mg, 650 mg, Oral, Q6H PRN, 650 mg at 08/06/23 2103 **OR** acetaminophen (TYLENOL) suppository 650 mg, 650 mg, Rectal, Q6H PRN, Doutova, Anastassia, MD   acetaminophen (TYLENOL) tablet 650 mg, 650 mg, Oral, Q24H, Dessie, Habtemariam, MD, 650 mg at 08/09/23 1029   albuterol (PROVENTIL) (2.5 MG/3ML) 0.083% nebulizer solution 2.5 mg, 2.5 mg, Nebulization, Q2H PRN, Doutova, Anastassia, MD   Chlorhexidine Gluconate Cloth 2 % PADS 6 each, 6 each, Topical, Daily, Leroy Sea, MD, 6 each at 08/09/23 1030   enoxaparin (LOVENOX) injection 40 mg, 40 mg, Subcutaneous, Q24H, Ghimire, Shanker M, MD, 40 mg at 08/09/23 1030   feeding supplement (BOOST / RESOURCE BREEZE) liquid 1 Container, 1 Container, Oral, TID BM, Mahan, Vicie Mutters, NP, 1 Container at 08/09/23 1030   hydrALAZINE (APRESOLINE) injection 10 mg, 10 mg, Intravenous, Q6H PRN, Leroy Sea, MD   HYDROcodone-acetaminophen (NORCO/VICODIN) 5-325 MG per tablet 1-2 tablet, 1-2 tablet, Oral, Q4H PRN, Adela Glimpse, Anastassia, MD, 1 tablet at 08/05/23 1808   lactose free nutrition (BOOST PLUS) liquid 237 mL,  237 mL, Oral, TID WC, Ghimire, Shanker M, MD, 237 mL at 08/09/23 1028   lidocaine (XYLOCAINE) 2 % (with pres) injection 200 mg, 10 mL, Intradermal, Once, Doutova, Anastassia, MD   melatonin tablet 5 mg, 5 mg, Oral, QHS, Ghimire, Shanker M, MD, 5 mg at 08/08/23 2056   metoprolol tartrate (LOPRESSOR) injection 5 mg, 5 mg, Intravenous, Q8H PRN, Leroy Sea, MD   metoprolol tartrate (LOPRESSOR) tablet 50 mg, 50 mg, Oral, BID, Susa Raring K, MD, 50 mg at 08/09/23 1029   mirtazapine (REMERON) tablet 7.5 mg, 7.5 mg, Oral, QHS,  Maretta Bees, MD, 7.5 mg at 08/08/23 2056   multivitamin with minerals tablet 1 tablet, 1 tablet, Oral, Daily, Ghimire, Werner Lean, MD, 1 tablet at 08/09/23 1029   ondansetron (ZOFRAN) tablet 4 mg, 4 mg, Oral, Q6H PRN **OR** ondansetron (ZOFRAN) injection 4 mg, 4 mg, Intravenous, Q6H PRN, Doutova, Anastassia, MD   QUEtiapine (SEROQUEL) tablet 25 mg, 25 mg, Oral, Daily PRN, Maretta Bees, MD, 25 mg at 08/06/23 2102   tamsulosin (FLOMAX) capsule 0.4 mg, 0.4 mg, Oral, Daily, Ghimire, Shanker M, MD, 0.4 mg at 08/09/23 1029  Labs and Diagnostic Imaging   CBC:  Recent Labs  Lab 08/08/23 0429 08/09/23 0436  WBC 5.6 8.8  NEUTROABS 3.6 7.0  HGB 12.1* 13.0  HCT 36.7* 39.3  MCV 95.1 95.4  PLT 218 218    Basic Metabolic Panel:  Lab Results  Component Value Date   NA 139 08/09/2023   K 3.9 08/09/2023   CO2 23 08/09/2023   GLUCOSE 117 (H) 08/09/2023   BUN 34 (H) 08/09/2023   CREATININE 0.60 (L) 08/09/2023   CALCIUM 8.8 (L) 08/09/2023   GFRNONAA >60 08/09/2023   GFRAA  07/10/2008    >60        The eGFR has been calculated using the MDRD equation. This calculation has not been validated in all clinical situations. eGFR's persistently <60 mL/min signify possible Chronic Kidney Disease.   Lipid Panel: No results found for: "LDLCALC" HgbA1c: No results found for: "HGBA1C" Urine Drug Screen: No results found for: "LABOPIA", "COCAINSCRNUR", "LABBENZ",  "AMPHETMU", "THCU", "LABBARB"  Alcohol Level No results found for: "ETH" INR  Lab Results  Component Value Date   INR 1.0 08/02/2023   APTT No results found for: "APTT" AED levels: No results found for: "PHENYTOIN", "ZONISAMIDE", "LAMOTRIGINE", "LEVETIRACETA"  MRI Brain(Personally reviewed): No acute intracranial abnormality. Findings of chronic small vessel ischemia.   MRI L/T/C Spine: Substantial abnormal ventral nerve root enhancement in the cauda equina, with some low-level leptomeningeal enhancement along the anterior margin of the conus. Guillain-Barre syndrome is the most likely cause of this pattern. Small central disc protrusion at T7-8 without spinal canal stenosis. Cervical spondylosis and degenerative disc disease causing prominent impingement at C3-4 and C4-5; mild to moderate impingement at C2-3; and mild impingement at C6-7   LP 4/2: CSF Culture: Prelim negative VDRL: non reactive  Cell count: RBC 1, WBC 2 IgG CSF: 8.4 M/E Panel: Negative OC Bands: Pending Protein: 93 Glucose: 64 VZV PCR: Negative   Lyme Disease: Negative    Assessment   Peter Stone is a 83 y.o. male with PMH significant for HTN, OSA, HLD who presented 4/1 with progressive generalized weakness, proximal worse than distal, now developing BUE weakness and bulbar symptoms. 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 MRI L spine, however, also showed evidence of contrast enhancement seen in GBS.    Chart review does show decreased PO intake after a fall in February. He was seen by outpatient neurology for the progressive weakness on 3/21 where wife noted him slowing down over the last few years. Patient had a more acute decline after a viral illness in December 2024, with inability to stand a couple of weeks after that. PT was ordered. Afib was noted by therapist, which  is a new diagnosis for patient. He is scheduled for OP cardiology 4/14. EKGs this admission have shown SR with RBB and prolonged QT.  LP was completed 4/2 under fluoro. High protein, consistent with albuminocytogenic dissociation. OG bands pending .    Albumin is low, as expected with his recent history of low PO intake. 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 Patient also takes remeron, which has a possible side effect of weakness and fatigue. However, patient just started on this medication 3/28 well after these symptoms started.    Recommendations   Agree with palliative care consult  ____________________________________________________________________   Pt seen by Neuro NP/APP and later by MD. Note/plan to be edited by MD as needed.    Lynnae January, DNP, AGACNP-BC [facility] Please use AMION for contact information & EPIC for messaging.  I have seen the patient and reviewed the above note.  With enhancing nerve roots, albuminocytologic dissociation, and time course, I think CIDP is likely, however ALS remains a possibility.  As discussed yesterday with Dr. Loleta Chance, steroids are unlikely to be beneficial.  If aggressive care is pursued, he will get monthly IVIG, but I think is very possible that he will not survive long enough to do this.  I discussed with the patient today, and he confirms that he would not want breathing or feeding tubes.  He is only able to count to six on a single breath, and I suspect that he will wear out eventually.  Appreciate palliative care involvement.  Ritta Slot, MD [facility]   If 7pm- 7am, please page neurology on call as listed in AMION.

## 2023-08-09 NOTE — Progress Notes (Signed)
 Noted changed to comfort care.  Neurology will continue to be available as needed, please call with further questions or concerns if we can be of any assistance with management or family discussions.  Ritta Slot, MD [facility]   If 7pm- 7am, please page neurology on call as listed in AMION.

## 2023-08-09 NOTE — Progress Notes (Addendum)
 OT Cancellation Note  Patient Details Name: Peter Stone MRN: [id number] DOB: [birth year]   Cancelled Treatment:    Reason Eval/Treat Not Completed: Other (comment) (per discussion with PT, MD asking to d/c therapy orders)  Carver Fila, OTD, OTR/L [facility] [phone]   Dalphine Handing 08/09/2023, 1:57 PM

## 2023-08-09 NOTE — Progress Notes (Signed)
 PT Cancellation Note  Patient Details Name: Nykeem Citro MRN: [id number] DOB: Feb 26, 1941   Cancelled Treatment:    Reason Eval/Treat Not Completed: Other (comment) Per MD, PT to d/c current therapy orders.  Aleda Grana, PT, DPT 08/09/23, 1:45 PM   Sandi Mariscal 08/09/2023, 1:45 PM

## 2023-08-09 NOTE — Progress Notes (Signed)
 Daily Progress Note   Patient Name: Peter Stone       Date: 08/09/2023 DOB: [birth year]  Age: 83 y.o. MRN#: [id number] Attending Physician: Leroy Sea, MD Primary Care Physician: Emilio Aspen, MD Admit Date: 08/01/2023  Reason for Consultation/Follow-up: Establishing goals of care  Patient Profile/HPI:  83 y.o. male  with past medical history of COPD, and ongoing weakness since [place], admitted on 08/01/2023 with worsening weakness and deconditioning. He was instructed by his neurologist to proceed to ED for lumbar puncture and review of his MRI that showed concern for GBS. 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 He has completed initial treatment with IVIG. Palliative medicine consulted for above.    Subjective: Chart reviewed including labs, progress notes, imaging from this and previous encounters. Reviewed note from Dr. Loleta Chance regarding discussion with spouse.  Noted per neuro NP note- patient tachypneic with abdominal breathing this morning. On eval patient is sleeping- having periods of apnea - per spouse this is in line with his normal breathing pattern when sleeping due to sleep apnea and he does not wear CPAP. He wakes briefly and appears distressed. Leia Alf confirms he was distressed/anxious earlier today- asking to go "inside". Denies pain- however, he has had lower extremity pain in the past- concerns he has pain that isn't expressed.  Leia Alf would like to begin comfort measures. She is aware that referral has been made for [facility].  During our discussion [employer] Shawn arrived to bedside.    Review of Systems  Unable to perform ROS: Mental status change     Physical Exam Vitals and nursing note reviewed.  Constitutional:       Appearance: He is cachectic. He is ill-appearing.  Eyes:     Comments: Bilateral enopthalmos   Cardiovascular:     Rate and Rhythm: Normal rate.  Pulmonary:     Comments: Apnea followed by tachypnea during sleep Neurological:     Comments: Somnolent but arouses easily, hard of hearing             Vital Signs: BP (!) 151/80 (BP Location: Left Arm)   Pulse 90   Temp 97.7 F (36.5 C) (Oral)   Resp (!) 22   Ht 5\' 10"  (1.778 m)   Wt 61.4 kg   SpO2 93%  BMI 19.42 kg/m  SpO2: SpO2: 93 % O2 Device: O2 Device: Room Air O2 Flow Rate:    Intake/output summary:  Intake/Output Summary (Last 24 hours) at 08/09/2023 1436 Last data filed at 08/09/2023 1300 Gross per 24 hour  Intake 400 ml  Output --  Net 400 ml   LBM: Last BM Date : 08/07/23 Baseline Weight: Weight: 63.3 kg Most recent weight: Weight: 61.4 kg       Palliative Assessment/Data: PPS: 20%      Patient Active Problem List   Diagnosis Date Noted   Protein-calorie malnutrition, severe 08/02/2023   GBS (Guillain Barre syndrome) (HCC) 08/01/2023   COPD (chronic obstructive pulmonary disease) (HCC) 08/01/2023   Essential hypertension 08/01/2023    Palliative Care Assessment & Plan    Assessment/Recommendations/Plan  Failure to thrive, significant debility and deconditioning in adult in setting of new diagnosed likely CIDP s/P IVIG without current significant response GOC- transition to comfort measures only- comfort orders entered Agitation- lorazepam 1mg  IV q6hrs scheduled as well as q4hrs prn Other comfort medications as ordered   Code Status:   Code Status: Do not attempt resuscitation (DNR) - Comfort care   Prognosis:  < 2 weeks  Discharge Planning: Hospice facility pending evaluation and approval  Care plan was discussed with spouse and care team.   Thank you for allowing the Palliative Medicine Team to assist in the care of this patient.    Preparing to see the patient (e.g., review of  tests) Obtaining and/or reviewing separately obtained history Performing a medically necessary appropriate examination and/or evaluation Counseling and educating the patient/family/caregiver Ordering medications, tests, or procedures Referring and communicating with other health care professionals (when not reported separately) Documenting clinical information in the electronic or other health record Independently interpreting results (not reported separately) and communicating results to the patient/family/caregiver Care coordination (not reported separately) Clinical documentation  Ocie Bob, AGNP-C Palliative Medicine   Please contact Palliative Medicine Team phone at [phone] for questions and concerns.

## 2023-08-10 DIAGNOSIS — G610 Guillain-Barre syndrome: Secondary | ICD-10-CM | POA: Diagnosis not present

## 2023-08-10 MED ORDER — HYDROMORPHONE BOLUS VIA INFUSION
1.0000 mg | INTRAVENOUS | Status: DC | PRN
  Administered 2023-08-10 (×2): 1 mg via INTRAVENOUS

## 2023-08-10 MED ORDER — HYDROMORPHONE HCL-NACL 50-0.9 MG/50ML-% IV SOLN
0.2500 mg/h | INTRAVENOUS | Status: DC
  Administered 2023-08-10: 0.25 mg/h via INTRAVENOUS
  Filled 2023-08-10: qty 50

## 2023-08-10 MED ORDER — HYDROMORPHONE HCL 1 MG/ML IJ SOLN
1.0000 mg | INTRAMUSCULAR | Status: DC | PRN
  Administered 2023-08-10 (×3): 1 mg via INTRAVENOUS
  Filled 2023-08-10 (×4): qty 1

## 2023-08-10 MED ORDER — MORPHINE SULFATE (PF) 2 MG/ML IV SOLN
2.0000 mg | INTRAVENOUS | Status: DC | PRN
  Administered 2023-08-10 (×2): 2 mg via INTRAVENOUS
  Filled 2023-08-10 (×2): qty 1

## 2023-08-10 MED ORDER — TAMSULOSIN HCL 0.4 MG PO CAPS: 0.4000 mg | ORAL_CAPSULE | Freq: Every day | ORAL | Status: DC

## 2023-08-11 DIAGNOSIS — G610 Guillain-Barre syndrome: Secondary | ICD-10-CM | POA: Diagnosis not present

## 2023-08-13 LAB — MUSK ANTIBODIES: MuSK Antibodies: <1 U/mL

## 2023-08-14 ENCOUNTER — Ambulatory Visit (HOSPITAL_COMMUNITY)

## 2023-08-14 ENCOUNTER — Encounter (HOSPITAL_COMMUNITY): Payer: Self-pay

## 2023-08-18 LAB — OLIGOCLONAL BANDS, CSF + SERM

## 2023-08-31 NOTE — Plan of Care (Signed)
 Palliative-   Received message from RN that patient experiencing labored breathing despite 8mg  total of morphine.  Hydromorphone 1 mg IV q 15 min prn ordered.   Ocie Bob, AGNP-C Palliative Medicine  No charge

## 2023-08-31 NOTE — Discharge Summary (Signed)
 Peter Stone NFA:[id number] DOB: 02/04/1941 DOA: 08/01/2023  PCP: Emilio Aspen, MD  Admit date: 08/01/2023  Discharge date: 08/13/2023  Admitted From: Home   Disposition:  Hospice   Recommendations for Outpatient Follow-up:   Follow up with PCP in 1-2 weeks  PCP Please obtain BMP/CBC, 2 view CXR in 1week,  (see Discharge instructions)   PCP Please follow up on the following pending results:     Home Health: None   Equipment/Devices: None  Consultations: Neuro, Pall care Discharge Condition: Guarded CODE STATUS: DNR Diet Recommendation: Soft diet for comfort with feeding assistance and aspiration precautions    No chief complaint on file.    Brief history of present illness from the day of admission and additional interim summary     83 y.o.  male with history of COPD, HTN, HLD, hard of hearing-who presented with progressive lower extremity weakness-(ongoing since Christmas-started using a walker)-but progressively worse over the past several weeks to the point that he has stopped ambulating.  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   Significant studies: 3/28>> MRI C-spine: Cervical spondylosis/degenerative disc disease. 3/28>> MRI L-spine: Ventral nerve root enhancement and cauda equina. 4/01>> MRI T-spine: Contrast enhancement of visualized nerve roots in the cauda equina. 4/01>> abdominal x-ray: Large stool burden.   Significant microbiology data: 4/01>> COVID/influenza/RSV PCR: Negative 4/02> CSF culture: No growth 4/02>> CSF meningitis/encephalitis panel:  Negative 4/2>> CSF VDRL: Nonreactive 4/2>> CSF VZV PCR: Negative   Procedures: 4/02>> LP                                                                 Hospital Course   Progressive lower extremity>> upper extremity weakness CSF positive for albuminocytogenic dissociation-suspicion for CIDP CSF studies noted discussed with neurology in detail on 08/08/2023 Finished 5 days of IVIG treatment with limited response and improvement, still extremely weak and cachectic, no significant clinical improvement. PT/OT CIR being contemplated Neurology following. NIF/VC being monitored by RT. Overall ++ weak, extremely frail and cachectic, poor cough reflex, poor oral intake, long-term prognosis does not look good.     Seen by palliative care, also involved patient's primary neurologist Dr. Jacquelyne Balint, long discussion between patient's wife, inpatient and outpatient neurology team and me along with palliative care, decided that patient will be best served with full comfort measures preferably at a residential hospice facility versus SNF with hospice.  All care directed towards comfort now.        COPD Stable No acute issues   HTN Now on comfort directed medications only   BPH As needed bladder scans Flomax    Discharge diagnosis  Principal Problem:   GBS (Guillain Barre syndrome) (HCC) Active Problems:   COPD (chronic obstructive pulmonary disease) (HCC)   Essential hypertension   Protein-calorie malnutrition, severe    Discharge instructions    Discharge Instructions     Discharge instructions   Complete by: As directed    Disposition.  Residential hospice/ SNF-Hospice Condition.  Guarded CODE STATUS.  DNR Activity.  With assistance as tolerated, full fall precautions. Diet.  Soft with feeding assistance and aspiration precautions. Goal of care.  Comfort.       Discharge Medications   Allergies as of 08/13/2023   No Active Allergies      Medication List      TAKE these medications    acetaminophen 325 MG tablet Commonly known as: TYLENOL Take 650 mg by mouth every 6 (six) hours as needed for moderate pain (pain score 4-6) or mild pain (pain score 1-3).   cetirizine 10 MG chewable tablet Commonly known as: ZYRTEC Chew 10 mg by mouth daily.   cholecalciferol 25 MCG (1000 UNIT) tablet Commonly known as: VITAMIN D3 Take 1,000 Units by mouth daily.   MENS MULTIPLUS PO Take 1 tablet by mouth daily.   mirtazapine 7.5 MG tablet Commonly known as: REMERON Take 7.5 mg by mouth at bedtime.   naproxen sodium 220 MG tablet Commonly known as: ALEVE Take 440 mg by mouth every 12 (twelve) hours. 10am and 10pm   tamsulosin 0.4 MG Caps capsule Commonly known as: FLOMAX Take 1 capsule (0.4 mg total) by mouth daily.          Major procedures and Radiology Reports - PLEASE review detailed and final reports thoroughly  -       DG Chest Port 1 View Result Date: 08/06/2023 CLINICAL DATA:  Shortness of breath. EXAM: PORTABLE CHEST 1 VIEW COMPARISON:  08/01/2023 FINDINGS: The lungs are clear without focal pneumonia, edema, pneumothorax or pleural effusion. The cardiopericardial silhouette is within normal limits for size. No acute bony abnormality. Telemetry leads overlie the chest. IMPRESSION: No active disease. Electronically Signed   By: Kennith Center M.D.   On: 08/06/2023 12:15   ECHOCARDIOGRAM COMPLETE Result Date: 08/04/2023    ECHOCARDIOGRAM REPORT   Patient Name:   Peter Stone Date of Exam: 08/04/2023 Medical Rec #:  [id number]        Height:       70.0 in Accession #:    [id number]       Weight:       139.6 lb Date of Birth:  05/17/1940        BSA:          1.791 m Patient Age:    83 years         BP:           119/66 mmHg Patient Gender: M                HR:           92 bpm. Exam Location:  Inpatient Procedure: 2D Echo, Cardiac Doppler and Color Doppler (Both Spectral and Color            Flow Doppler were utilized during procedure).  Indications:    R06.02 SOB  History:        Patient has no prior history of Echocardiogram examinations.                 COPD; Risk Factors:Hypertension.  Sonographer:    Maxwell Marion Referring Phys: [id number]  M GHIMIRE IMPRESSIONS  1. Respirophasic septal motion which can been seen with COPD, obesity, and pericardial disease. Left ventricular ejection fraction, by estimation, is 55 to 60%. The left ventricle has normal function. The left ventricle has no regional wall motion abnormalities. Left ventricular diastolic parameters are consistent with Grade I diastolic dysfunction (impaired relaxation).  2. Right ventricular systolic function is normal. The right ventricular size is normal.  3. The mitral valve is normal in structure. No evidence of mitral valve regurgitation. No evidence of mitral stenosis. Moderate mitral annular calcification.  4. The aortic valve is normal in structure. Aortic valve regurgitation is not visualized. No aortic stenosis is present.  5. The inferior vena cava is normal in size with greater than 50% respiratory variability, suggesting right atrial pressure of 3 mmHg. FINDINGS  Left Ventricle: Respirophasic septal motion which can been seen with COPD, obesity, and pericardial disease. Left ventricular ejection fraction, by estimation, is 55 to 60%. The left ventricle has normal function. The left ventricle has no regional wall  motion abnormalities. Definity contrast agent was given IV to delineate the left ventricular endocardial borders. The left ventricular internal cavity size was normal in size. There is no left ventricular hypertrophy. Left ventricular diastolic parameters are consistent with Grade I diastolic dysfunction (impaired relaxation). Right Ventricle: The right ventricular size is normal. No increase in right ventricular wall thickness. Right ventricular systolic function is normal. Left Atrium: Left atrial size was normal in size. Right Atrium: Right atrial size was  normal in size. Pericardium: There is no evidence of pericardial effusion. Mitral Valve: The mitral valve is normal in structure. Moderate mitral annular calcification. No evidence of mitral valve regurgitation. No evidence of mitral valve stenosis. Tricuspid Valve: The tricuspid valve is normal in structure. Tricuspid valve regurgitation is not demonstrated. No evidence of tricuspid stenosis. Aortic Valve: The aortic valve is normal in structure. Aortic valve regurgitation is not visualized. No aortic stenosis is present. Pulmonic Valve: The pulmonic valve was normal in structure. Pulmonic valve regurgitation is not visualized. No evidence of pulmonic stenosis. Aorta: The aortic root is normal in size and structure. Venous: The inferior vena cava is normal in size with greater than 50% respiratory variability, suggesting right atrial pressure of 3 mmHg. IAS/Shunts: No atrial level shunt detected by color flow Doppler. Clearnce Hasten Electronically signed by Clearnce Hasten Signature Date/Time: 08/04/2023/9:55:47 AM    Final    DG FL GUIDED LUMBAR PUNCTURE Result Date: 08/02/2023 CLINICAL DATA:  83 year old male with progressive weakness of bilateral lower extremities for the past few months following a viral infection in December. Now with weakness in bilateral upper extremities; concerning for Guillain-Barrre syndrome. Request for lumbar puncture. EXAM: LUMBAR PUNCTURE UNDER FLUOROSCOPY PROCEDURE: An appropriate skin entry site was determined fluoroscopically. Operator donned sterile gloves and mask. Skin site was marked, then prepped with Betadine, draped in usual sterile fashion, and infiltrated locally with 1% lidocaine. A 20 gauge spinal needle advanced into the thecal sac at L4-5 from a right interlaminar approach. Clear colorless CSF spontaneously returned, with opening pressure of 20 cm water. 10 ml CSF were collected and divided among 4 sterile vials for the requested laboratory studies. The needle was  then removed. The patient tolerated the procedure well and there were no complications. FLUOROSCOPY: Radiation Exposure Index (as provided by the fluoroscopic device): 8.8 mGy Kerma IMPRESSION: Technically successful lumbar puncture under fluoroscopy. This exam was performed by Sherlene Shams, PA-C, and was supervised and interpreted by Dr. Mosie Epstein. Electronically  Signed   By: Gilmer Mor D.O.   On: 08/02/2023 14:00   MR THORACIC SPINE W WO CONTRAST Result Date: 08/01/2023 CLINICAL DATA:  Lumbar myelopathy. EXAM: MRI THORACIC WITHOUT AND WITH CONTRAST TECHNIQUE: Multiplanar and multiecho pulse sequences of the thoracic spine were obtained without and with intravenous contrast. CONTRAST:  8mL GADAVIST GADOBUTROL 1 MMOL/ML IV SOLN COMPARISON:  Lumbar spine MRI 07/29/2018 FINDINGS: Alignment:  Physiologic. Vertebrae: No fracture, evidence of discitis, or bone lesion. Cord: Normal signal and morphology. Contrast enhancement of the visualized ventral nerve roots of the cauda equina. Paraspinal and other soft tissues: Left basilar atelectasis. Disc levels: T7-8: Small central disc protrusion without spinal canal stenosis. The other disc levels are unremarkable. IMPRESSION: 1. Contrast enhancement of the visualized ventral nerve roots of the cauda equina. As previously described this can be seen in the setting of Guillain-Barre syndrome. 2. Small central disc protrusion at T7-8 without spinal canal stenosis. Electronically Signed   By: Deatra Robinson M.D.   On: 08/01/2023 23:42   MR BRAIN W WO CONTRAST Result Date: 08/01/2023 CLINICAL DATA:  Altered mental status EXAM: MRI HEAD WITHOUT AND WITH CONTRAST TECHNIQUE: Multiplanar, multiecho pulse sequences of the brain and surrounding structures were obtained without and with intravenous contrast. CONTRAST:  8mL GADAVIST GADOBUTROL 1 MMOL/ML IV SOLN COMPARISON:  04/07/2021 FINDINGS: Brain: No acute infarct, mass effect or extra-axial collection. No acute or chronic  hemorrhage. There is multifocal hyperintense T2-weighted signal within the white matter. Parenchymal volume and CSF spaces are normal. The midline structures are normal. There is no abnormal contrast enhancement. Vascular: Normal flow voids. Skull and upper cervical spine: Normal calvarium and skull base. Visualized upper cervical spine and soft tissues are normal. Sinuses/Orbits:No paranasal sinus fluid levels or advanced mucosal thickening. No mastoid or middle ear effusion. Normal orbits. IMPRESSION: 1. No acute intracranial abnormality. 2. Findings of chronic small vessel ischemia. Electronically Signed   By: Deatra Robinson M.D.   On: 08/01/2023 23:31   DG Chest Port 1 View Result Date: 08/01/2023 CLINICAL DATA:  Generalized weakness and dehydration EXAM: PORTABLE CHEST 1 VIEW COMPARISON:  Radiograph 06/23/2023 FINDINGS: The heart size and mediastinal contours are within normal limits. Both lungs are clear. The visualized skeletal structures are unremarkable. IMPRESSION: No active disease. Electronically Signed   By: Minerva Fester M.D.   On: 08/01/2023 21:19   DG Abd 1 View Result Date: 08/01/2023 CLINICAL DATA:  Generalized weakness, dehydration, left leg cramping, poor intake and frequent urine is a shin EXAM: ABDOMEN - 1 VIEW COMPARISON:  Radiograph 02/27/2015 FINDINGS: Large colonic stool burden. No evidence of bowel obstruction. No acute osseous abnormality. IMPRESSION: Large colonic stool burden.  No evidence of obstruction. Electronically Signed   By: Minerva Fester M.D.   On: 08/01/2023 21:19   MR CERVICAL SPINE W WO CONTRAST Result Date: 07/31/2023 CLINICAL DATA:  Progressive bilateral upper and lower extremity weakness EXAM: MRI CERVICAL SPINE WITHOUT AND WITH CONTRAST TECHNIQUE: Multiplanar and multiecho pulse sequences of the cervical spine, to include the craniocervical junction and cervicothoracic junction, were obtained without and with intravenous contrast. CONTRAST:  7.59mL GADAVIST  GADOBUTROL 1 MMOL/ML IV SOLN COMPARISON:  Radiographs 02/23/2015 FINDINGS: Despite efforts by the technologist and patient, prominent motion artifact is present on today's exam and could not be eliminated. This reduces exam sensitivity and specificity. Alignment: 1.5 mm degenerative retrolisthesis at C3-4. Vertebrae: Loss of disc height along with disc desiccation in the cervical spine most notable at C3-4 and C4-5 with mild  degenerative endplate findings. Cord: Grossly unremarkable subject to limitations of assessment related to motion artifact. Posterior Fossa, vertebral arteries, paraspinal tissues: Unremarkable Disc levels: C2-3: Mild to moderate bilateral foraminal stenosis due to uncinate and facet spurring. C3-4: Prominent left and moderate to prominent right foraminal stenosis and mild central narrowing of the thecal sac due to disc bulge, uncinate spurring, and facet arthropathy. C4-5: Prominent bilateral foraminal stenosis due to uncinate and facet spurring. C5-6: No impingement.  Bilateral degenerative facet arthropathy. C6-7: Mild right and borderline left foraminal stenosis due to uncinate and facet spurring. C7-T1: Unremarkable IMPRESSION: 1. Cervical spondylosis and degenerative disc disease causing prominent impingement at C3-4 and C4-5; mild to moderate impingement at C2-3; and mild impingement at C6-7, as detailed above. 2. No cord signal abnormality is identified, but assessment is limited by motion artifact. Electronically Signed   By: Gaylyn Rong M.D.   On: 07/31/2023 13:25   MR Lumbar Spine W Wo Contrast Result Date: 07/31/2023 CLINICAL DATA:  Upper and lower extremity weakness EXAM: MRI LUMBAR SPINE WITHOUT AND WITH CONTRAST TECHNIQUE: Multiplanar and multiecho pulse sequences of the lumbar spine were obtained without and with intravenous contrast. CONTRAST:  7.55mL GADAVIST GADOBUTROL 1 MMOL/ML IV SOLN COMPARISON:  Radiographs 06/16/2023 FINDINGS: Segmentation: The lowest lumbar type  non-rib-bearing vertebra is labeled as L5. Alignment: 3 mm degenerative anterolisthesis at L5-S1. Mild dextroconvex scoliosis with rotary component. Vertebrae: Striking loss of intervertebral disc height throughout the lumbar spine although less severe at L1-2. Schmorl's nodes at the L1-2 level. Type 2 degenerative endplate findings at L1-2, L4-5, and L5-S1. Conus medullaris and cauda equina: Conus extends to the T12-L1 level. Substantial abnormal ventral nerve root enhancement in the cauda equina. There is also some low-level leptomeningeal enhancement along the anterior margin of the conus on image 9 series 17. Guillain-Barre syndrome is the most likely cause of this pattern (although the left a meningeal enhancement is not atypical feature). The pattern can rarely be seen in some other settings including spinal cord infarct and tick-borne encephalopathy. Leptomeningeal metastatic disease seems less likely given how the dorsal nerve roots are universally spared. Paraspinal and other soft tissues: Unremarkable Disc levels: T12-L1: Unremarkable L1-2: Mild left subarticular lateral recess stenosis and mild displacement of the right L1 nerve in the lateral extraforaminal space due to right lateral extraforaminal disc protrusion along with disc osteophyte complex and facet arthropathy. L2-3: Mild left subarticular lateral recess stenosis due to disc osteophyte complex and facet arthropathy. L3-4: Mild left subarticular lateral recess stenosis and mild right foraminal stenosis due to disc osteophyte complex and facet arthropathy. L4-5: No impingement.  Intervertebral and facet spurring noted. L5-S1: Borderline bilateral foraminal stenosis due to facet spurring. IMPRESSION: 1. Substantial abnormal ventral nerve root enhancement in the cauda equina, with some low-level leptomeningeal enhancement along the anterior margin of the conus. Guillain-Barre syndrome is the most likely cause of this pattern. The pattern can rarely  be seen in some other settings including spinal cord infarct and tick-borne encephalopathy. Leptomeningeal metastatic disease seems less likely given how the dorsal nerve roots are spared. 2. Lumbar spondylosis and degenerative disc disease, causing mild impingement at L1-2, L2-3, and L3-4. 3. Mild dextroconvex scoliosis with rotary component. Electronically Signed   By: Gaylyn Rong M.D.   On: 07/31/2023 13:19   NCV with EMG(electromyography) Result Date: 07/31/2023 Antony Madura, MD     08/01/2023 10:02 AM [facility] [address] [place], [address]  [place], [place] [address] Tel: [phone] Fax: [phone]  Test Date:  07/31/2023 Patient: Peter Stone DOB: 1940/07/30 Physician: Jacquelyne Balint, MD Sex: Male Height: 5\' 10"  Ref Phys: Jacquelyne Balint, MD ID#: [id number]   Technician:  History: This is an 83 year old male with progressive weakness. NCV & EMG Findings: Extensive electrodiagnostic evaluation of the left upper and lower limbs with needle examination of thoracic paraspinal muscles shows: Left sural and superficial peroneal/fibular sensory responses are absent. Left median, ulnar, and radial sensory responses are within normal limits. Left peroneal/fibular (EDB), peroneal/fibular (TA), tibial (AH), median (APB), and ulnar (ADM) motor responses show reduced amplitudes (EDB 1.03 mV, TA 1.40 mV, AH 3.5 mV, APB 4.8 mV, ADM 6.6 mV). Conduction velocities are normal. Left H reflex is absent. Chronic motor axon loss changes WITH accompanying active denervation changes are seen in left tibialis anterior and pronator teres. Active denervation changes without any observable motor units are seen in left medial head of gastrocnemius, short head of biceps femoris, rectus femoris, and gluteus medius, Chronic motor axon loss changes WITHOUT definitive accompanying active denervation changes are seen in left first dorsal interosseous, extensor indicis proprius, biceps, deltoid, and thoracic paraspinal (T9  level muscles (which patient could not relax). Patient could not tolerate further paraspinal muscle testing due to discomfort. Fasciculations are seen in at least 3 of 12 tested muscles. Impression: This is an abnormal study. The findings are most consistent with the following: There is active/ongoing denervation with reinnervation in all muscles evaluated in the lower extremity with mostly chronic changes seen in the upper extremity. This could be representative of a wide spread, non-length dependent neuropathy given absent lower extremity sensory responses. Alternatively, absent lower extremity sensory responses may be due to age, which would make changes more consistent with polyradiculopathy or an evolving disorder of anterior horn cells (ie: motor neuron disease) among other possibilities. There is no definitive evidence of a demyelinating neuropathy. No electrodiagnostic evidence of myopathy. ___________________________ Jacquelyne Balint, MD Nerve Conduction Studies Motor Nerve Results   Latency Amplitude F-Lat Segment Distance CV Comment Site (ms) Norm (mV) Norm (ms)  (cm) (m/s) Norm  Left Fibular (EDB) Motor Ankle 4.5  < 6.0 *1.03  > 2.5       Bel fib head *NR - *NR -  Bel fib head-Ankle - *NR  > 40  Pop fossa *NR - *NR -  Pop fossa-Bel fib head - *NR -  Left Fibular (TA) Motor Fib head 2.9  < 4.5 *1.40  > 3.0       Pop fossa 5.2  < 6.7 1.34 -  Pop fossa-Fib head 10 43  > 40  Left Median (APB) Motor Wrist 2.7  < 4.0 *4.8  > 5.0       Elbow 8.4 - 4.4 -  Elbow-Wrist 28.5 50  > 50  Left Tibial (AH) Motor Ankle 5.5  < 6.0 *3.5  > 4.0       Knee 15.5 - 1.52 -  Knee-Ankle 44 44  > 40  Left Ulnar (ADM) Motor Wrist 2.2  < 3.1 *6.6  > 7.0       Bel elbow 6.4 - 4.5 -  Bel elbow-Wrist 22 52  > 50  Ab elbow 8.3 - 3.8 -  Ab elbow-Bel elbow 10 53 -  Sensory Sites   Neg Peak Lat Amplitude (O-P) Segment Distance Velocity Comment Site (ms) Norm (V) Norm  (cm) (ms)  Left Median Sensory Wrist-Dig II 3.1  < 3.8 15  > 10 Wrist-Dig II  13   Left  Radial Sensory Forearm-Wrist 2.5  < 2.8 13  > 10 Forearm-Wrist 10   Left Superficial Fibular Sensory 14 cm-Ankle *NR  < 4.6 *NR  > 3 14 cm-Ankle 14   Left Sural Sensory Calf-Lat mall *NR  < 4.6 *NR  > 3 Calf-Lat mall 14   Left Ulnar Sensory Wrist-Dig V 2.9  < 3.2 6  > 5 Wrist-Dig V 11   H-Reflex Results   M-Lat H Lat H Neg Amp H-M Lat Site (ms) (ms) Norm (mV) (ms) Left Tibial H-Reflex Pop fossa 7.9 -  < 35.0 - - Electromyography  Side Muscle Ins.Act Fibs Fasc Recrt Amp Dur Poly Activation Comment Left FDI Nml Nml *1+ *3- *1+ *1+ *1+ *1- N/A Left EIP Nml Nml *1+ *2- *1+ *1+ *1+ *1- N/A Left Pronator teres Nml *2+ Nml *2- *1+ *1+ *1+ *1- N/A Left Biceps Nml Nml *1+ *3- *1+ *1+ *1+ *1- N/A Left Triceps Nml Nml Nml *None *- *- *- *2- N/A Left Deltoid Nml Nml Nml *2- Nml Nml Nml *2- N/A Left Tib ant Nml *2+ Nml *3- *1+ *1+ *1+ *2- N/A Left Gastroc MH Nml *1+ Nml *None *- *- *- *None N/A Left Rectus fem Nml *2+ Nml *None *- *- *- *None N/A Left Biceps fem SH Nml *2+ Nml *None *- *- *- *None N/A Left T9 PSP Nml Nml Nml *1- *1+ *1+ *1+ Nml Unable to relax Left Gluteus med Nml *1+ Nml *None *- *- *- *None N/A Waveforms: Motor         Sensory         H-Reflex     Micro Results    Recent Results (from the past 240 hours)  Resp panel by RT-PCR (RSV, Flu A&B, Covid) Anterior Nasal Swab     Status: None   Collection Time: 08/01/23  1:51 PM   Specimen: Anterior Nasal Swab  Result Value Ref Range Status   SARS Coronavirus 2 by RT PCR NEGATIVE NEGATIVE Final   Influenza A by PCR NEGATIVE NEGATIVE Final   Influenza B by PCR NEGATIVE NEGATIVE Final    Comment: (NOTE) The Xpert Xpress SARS-CoV-2/FLU/RSV plus assay is intended as an aid in the diagnosis of influenza from Nasopharyngeal swab specimens and should not be used as a sole basis for treatment. Nasal washings and aspirates are unacceptable for Xpert Xpress SARS-CoV-2/FLU/RSV testing.  Fact Sheet for  Patients: BloggerCourse.com  Fact Sheet for Healthcare Providers: SeriousBroker.it  This test is not yet approved or cleared by the [place] FDA and has been authorized for detection and/or diagnosis of SARS-CoV-2 by FDA under an Emergency Use Authorization (EUA). This EUA will remain in effect (meaning this test can be used) for the duration of the COVID-19 declaration under Section 564(b)(1) of the Act, 21 U.S.C. section 360bbb-3(b)(1), unless the authorization is terminated or revoked.     Resp Syncytial Virus by PCR NEGATIVE NEGATIVE Final    Comment: (NOTE) Fact Sheet for Patients: BloggerCourse.com  Fact Sheet for Healthcare Providers: SeriousBroker.it  This test is not yet approved or cleared by the [place] FDA and has been authorized for detection and/or diagnosis of SARS-CoV-2 by FDA under an Emergency Use Authorization (EUA). This EUA will remain in effect (meaning this test can be used) for the duration of the COVID-19 declaration under Section 564(b)(1) of the Act, 21 U.S.C. section 360bbb-3(b)(1), unless the authorization is terminated or revoked.  Performed at [facility], [address], [place], [place] [address]  VZV PCR, CSF     Status: None   Collection Time: 08/02/23 12:59 PM   Specimen: Lumbar Puncture; Cerebrospinal Fluid  Result Value Ref Range Status   VZV PCR, CSF Negative Negative Final    Comment: (NOTE) No Varicella Zoster Virus DNA detected. Performed At: [facility] [address] [place], [place] [id number] Jolene Schimke MD HY:[id number]   CSF culture w Gram Stain     Status: None   Collection Time: 08/02/23 12:59 PM   Specimen: PATH Cytology CSF; Cerebrospinal Fluid  Result Value Ref Range Status   Specimen Description CSF  Final   Special Requests NONE  Final   Gram Stain   Final    WBC PRESENT,  PREDOMINANTLY MONONUCLEAR NO ORGANISMS SEEN CYTOSPIN SMEAR    Culture   Final    NO GROWTH 3 DAYS Performed at [facility], [address], [place], [place] [address]    Report Status 08/05/2023 FINAL  Final    Today   Subjective    Peter Stone today has no headache,no chest abdominal pain,no new weakness tingling or numbness.   Objective   Blood pressure (!) 156/77, pulse (!) 111, temperature 99.3 F (37.4 C), temperature source Axillary, resp. rate (!) 32, height 5\' 10"  (1.778 m), weight 61.4 kg, SpO2 94%.   Intake/Output Summary (Last 24 hours) at 08/26/2023 0937 Last data filed at 08/16/2023 1324 Gross per 24 hour  Intake 250 ml  Output 625 ml  Net -375 ml    Exam    Extremely frail, weak elderly gentleman, laying in bed, strength 2/5 in all 4 extremities, extremely weak cough reflex Supple Neck,   Symmetrical Chest wall movement, Good air movement bilaterally, CTAB RRR,No Gallops,   +ve B.Sounds, Abd Soft, Non tender,  No Cyanosis, Clubbing or edema    Data Review   Recent Labs  Lab 08/05/23 0602 08/06/23 0555 08/07/23 0426 08/08/23 0429 08/09/23 0436  WBC 6.0 4.7 4.7 5.6 8.8  HGB 12.5* 12.4* 12.0* 12.1* 13.0  HCT 36.3* 37.1* 35.6* 36.7* 39.3  PLT 217 223 216 218 218  MCV 94.3 95.6 95.2 95.1 95.4  MCH 32.5 32.0 32.1 31.3 31.6  MCHC 34.4 33.4 33.7 33.0 33.1  RDW 15.5 15.3 15.3 15.3 15.5  LYMPHSABS  --  1.0 0.9 0.9 0.7  MONOABS  --  0.8 0.8 0.9 1.0  EOSABS  --  0.2 0.3 0.2 0.0  BASOSABS  --  0.0 0.0 0.0 0.0    Recent Labs  Lab 08/05/23 0602 08/06/23 0505 08/06/23 0555 08/07/23 0426 08/08/23 0429 08/09/23 0436  NA 140  --  141 140 139 139  K 3.6  --  3.7 3.7 3.4* 3.9  CL 110  --  109 109 107 108  CO2 24  --  26 24 24 23   ANIONGAP 6  --  6 7 8 8   GLUCOSE 115*  --  107* 112* 106* 117*  BUN 28*  --  27* 28* 35* 34*  CREATININE 0.59*  --  0.63 0.51* 0.63 0.60*  AST  --   --   --  40 64* 63*  ALT  --   --   --  38 57* 61*   ALKPHOS  --   --   --  89 85 92  BILITOT  --   --   --  0.6 0.6 0.7  ALBUMIN  --   --   --  2.2* 2.3* 2.5*  TSH  --  0.625  --   --   --   --  BNP  --   --  41.2 37.5 39.2 42.0  MG  --   --  2.0 2.1 2.0 2.1  PHOS  --   --  2.9 3.1 3.4 4.0  CALCIUM 8.8*  --  8.8* 8.6* 8.7* 8.8*    Total Time in preparing paper work, data evaluation and todays exam - 35 minutes  Signature  -    Susa Raring M.D on 08/09/2023 at 9:37 AM   -  To page go to [email or web address]

## 2023-08-31 NOTE — Progress Notes (Signed)
 Daily Progress Note   Patient Name: Peter Stone       Date: 08/28/2023 DOB: [birth year]  Age: 83 y.o. MRN#: [id number] Attending Physician: Leroy Sea, MD Primary Care Physician: Emilio Aspen, MD Admit Date: 08/01/2023  Reason for Consultation/Follow-up: Establishing goals of care  Patient Profile/HPI:   83 y.o. male  with past medical history of COPD, and ongoing weakness since [place], admitted on 08/01/2023 with worsening weakness and deconditioning. He was instructed by his neurologist to proceed to ED for lumbar puncture and review of his MRI that showed concern for GBS. 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 He has completed initial treatment with IVIG. Palliative medicine consulted for above.  Transitioned to full comfort measures only 08/09/2023.  Subjective:  Chart reviewed including labs, progress notes, imaging from this and previous encounters.  Received secure chat from [place], hospice liaison. He has concerns patient is not stable to transfer.  On eval patient with elevated RR, using accessory muscles. Nasolabial fold flattening present. No response to noxious stimuli. Urine in foley bag is concentrated dark cola color. Appears to be actively dying.  Discussed concerns that patient could die during transport with [name]. She had same concerns before we spoke. She would like patient to remain in hospital until death.  Discussed with attending MD and TOC as well.   Review of Systems  Unable to perform ROS: Acuity of condition     Physical Exam Vitals and nursing note reviewed.  Constitutional:      Appearance: He is ill-appearing.  Pulmonary:     Comments: Increased rate and effort, accessory muscle usage present Skin:     Coloration: Skin is pale.  Neurological:     Comments: Unresponsive to noxious stimuli             Vital Signs: BP (!) 156/77 (BP Location: Left Arm)   Pulse (!) 111   Temp 99.3 F (37.4 C) (Axillary)   Resp (!) 32   Ht 5\' 10"  (1.778 m)   Wt 61.4 kg   SpO2 94%   BMI 19.42 kg/m  SpO2: SpO2: 94 % O2 Device: O2 Device: Room Air O2 Flow Rate:    Intake/output summary:  Intake/Output Summary (Last 24 hours) at 08/09/2023 1056 Last data filed at 08/22/2023 4098 Gross  per 24 hour  Intake 250 ml  Output 625 ml  Net -375 ml   LBM: Last BM Date : 08/07/23 Baseline Weight: Weight: 63.3 kg Most recent weight: Weight: 61.4 kg       Palliative Assessment/Data: PPS: 10%      Patient Active Problem List   Diagnosis Date Noted   Protein-calorie malnutrition, severe 08/02/2023   GBS (Guillain Barre syndrome) (HCC) 08/01/2023   COPD (chronic obstructive pulmonary disease) (HCC) 08/01/2023   Essential hypertension 08/01/2023    Palliative Care Assessment & Plan    Assessment/Recommendations/Plan  Continue current comfort measures including IV morphine and IV ativan Requested RN administer IV morphine as ordered due to patient's appearance of discomfort with increased RR and accessory muscle usage Does not appear stable for transport out of facility without risk of dying during transport- recommend stay in hospital until end of life   Code Status:   Code Status: Do not attempt resuscitation (DNR) - Comfort care   Prognosis:  Hours - Days  Discharge Planning: Anticipated Hospital Death  Care plan was discussed with patient, patient's spouse and care team.   Thank you for allowing the Palliative Medicine Team to assist in the care of this patient.  Total time: 60 minutes  Prolonged billing:  Time includes:   Preparing to see the patient (e.g., review of tests) Obtaining and/or reviewing separately obtained history Performing a medically necessary appropriate  examination and/or evaluation Counseling and educating the patient/family/caregiver Ordering medications, tests, or procedures Referring and communicating with other health care professionals (when not reported separately) Documenting clinical information in the electronic or other health record Independently interpreting results (not reported separately) and communicating results to the patient/family/caregiver Care coordination (not reported separately) Clinical documentation  Ocie Bob, AGNP-C Palliative Medicine   Please contact Palliative Medicine Team phone at [phone] for questions and concerns.

## 2023-08-31 NOTE — Discharge Instructions (Signed)
 Disposition.  Residential hospice/ SNF-Hospice Condition.  Guarded CODE STATUS.  DNR Activity.  With assistance as tolerated, full fall precautions. Diet.  Soft with feeding assistance and aspiration precautions. Goal of care.  Comfort.

## 2023-08-31 NOTE — Progress Notes (Signed)
 Patient TOD 2139. Wife and friend at bedside. Bedside RN and Consulting civil engineer notified MD on call. IV removed and Foley Removed. No belongings at bedside. Patient taken to the morgue. Wasted 46.39ml of dilaudid from gtt. Witnessed by charge [employer].

## 2023-08-31 NOTE — Plan of Care (Signed)
 Palliative-   Received message from patient's RN that patient continues to have labored respirations and appears uncomfortable despite maximum hydromorphone doses. Will order infusion with bolus dosing.   Ocie Bob, AGNP-C Palliative Medicine  No charge

## 2023-08-31 NOTE — Plan of Care (Signed)
  Problem: Education: Goal: Knowledge of General Education information will improve Description: Including pain rating scale, medication(s)/side effects and non-pharmacologic comfort measures Outcome: Not Progressing   Problem: Health Behavior/Discharge Planning: Goal: Ability to manage health-related needs will improve Outcome: Not Progressing   Problem: Clinical Measurements: Goal: Ability to maintain clinical measurements within normal limits will improve Outcome: Not Progressing Goal: Will remain free from infection Outcome: Not Progressing Goal: Diagnostic test results will improve Outcome: Not Progressing Goal: Respiratory complications will improve Outcome: Not Progressing Goal: Cardiovascular complication will be avoided Outcome: Not Progressing   Problem: Activity: Goal: Risk for activity intolerance will decrease Outcome: Not Progressing   Problem: Nutrition: Goal: Adequate nutrition will be maintained Outcome: Not Progressing   Problem: Coping: Goal: Level of anxiety will decrease Outcome: Not Progressing   Problem: Elimination: Goal: Will not experience complications related to bowel motility Outcome: Not Progressing Goal: Will not experience complications related to urinary retention Outcome: Not Progressing   Problem: Pain Managment: Goal: General experience of comfort will improve and/or be controlled Outcome: Not Progressing   Problem: Safety: Goal: Ability to remain free from injury will improve Outcome: Not Progressing   Problem: Skin Integrity: Goal: Risk for impaired skin integrity will decrease Outcome: Not Progressing   Problem: Education: Goal: Knowledge of the prescribed therapeutic regimen will improve Outcome: Not Progressing   Problem: Coping: Goal: Ability to identify and develop effective coping behavior will improve Outcome: Not Progressing   Problem: Clinical Measurements: Goal: Quality of life will improve Outcome: Not  Progressing   Problem: Respiratory: Goal: Verbalizations of increased ease of respirations will increase Outcome: Not Progressing   Problem: Role Relationship: Goal: Family's ability to cope with current situation will improve Outcome: Not Progressing Goal: Ability to verbalize concerns, feelings, and thoughts to partner or family member will improve Outcome: Not Progressing   Problem: Pain Management: Goal: Satisfaction with pain management regimen will improve Outcome: Not Progressing

## 2023-08-31 NOTE — Death Summary Note (Signed)
 [facility] Death Note                                                                                                                                                                                               Peter Stone, is a 83 y.o. male, DOB - 07/12/1940, ZOX:[id number]  Admit date - Aug 30, 2023   Admitting Physician Peter Doyne, MD  Outpatient Primary MD for the patient is Peter Aspen, MD  LOS - 10  No chief complaint on file.      Notification: Peter Aspen, MD notified of death of 09-09-23   Admit Date:  2023-08-30  Date of Death: Date of Death: 09/08/2023  Time of Death: Time of Death: Sep 11, 2137  Length of Stay: 10   Pronounced by - RN  History of present illness:   Peter Stone is a 83 y.o. male with a history of -   83 y.o.  male with history of COPD, HTN, HLD, hard of hearing-who presented with progressive lower extremity weakness-(ongoing since Christmas-started using a walker)-but progressively worse over the past several weeks to the point that he has stopped ambulating.  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   Final Diagnoses:  Cause of death -  CDIP  Signature  -    Peter Stone M.D on 09-09-2023 at 5:43 AM   -  To page go to [email or web address]   Total clinical and documentation time for today Under 30 minutes   Last Note  Peter Stone WJX:[id number] DOB: 1940-07-02 DOA: 08/01/2023  PCP: Peter Aspen, MD  Admit date: 08/01/2023  Discharge date: 08/09/2023  Admitted From: Home    Disposition:  Hospice   Recommendations for Outpatient Follow-up:   Follow up with PCP in 1-2 weeks  PCP Please obtain BMP/CBC, 2 view CXR in 1week,  (see Discharge instructions)   PCP Please follow up on the following pending results:     Home Health: None   Equipment/Devices: None  Consultations: Neuro, Pall care Discharge Condition: Guarded CODE STATUS: DNR Diet Recommendation: Soft diet for comfort with feeding assistance and aspiration precautions    No chief complaint on file.    Brief history of present illness from the day of admission and additional interim summary     83 y.o.  male with history of COPD, HTN, HLD, hard of hearing-who presented with progressive lower extremity weakness-(ongoing since Christmas-started using a walker)-but progressively worse over the past several weeks to the point that he has stopped ambulating.  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   Significant studies: 3/28>> MRI C-spine: Cervical spondylosis/degenerative disc disease. 3/28>> MRI L-spine: Ventral nerve root enhancement and cauda equina. 4/01>> MRI T-spine: Contrast enhancement of visualized nerve roots in the cauda equina. 4/01>> abdominal x-ray: Large stool burden.   Significant microbiology data: 4/01>> COVID/influenza/RSV PCR: Negative 4/02> CSF culture: No growth 4/02>> CSF meningitis/encephalitis panel: Negative 4/2>> CSF VDRL: Nonreactive 4/2>> CSF VZV PCR: Negative   Procedures: 4/02>> LP                                                                 Hospital Course   Progressive lower extremity>> upper extremity weakness CSF positive for albuminocytogenic dissociation-suspicion for CIDP CSF studies noted discussed with neurology  in detail on 08/08/2023 Finished 5 days of IVIG treatment with limited response and improvement, still extremely weak and cachectic, no significant clinical improvement. PT/OT CIR being contemplated Neurology following. NIF/VC being monitored by RT. Overall ++ weak, extremely frail and cachectic, poor cough reflex, poor oral intake, long-term prognosis does not look good.     Seen by palliative care, also involved patient's primary neurologist Peter Stone, long discussion between patient's wife, inpatient and outpatient neurology team and me along with palliative care, decided that patient will be best served with full comfort measures preferably at a residential hospice facility versus SNF with hospice.  All care directed towards comfort now.        COPD Stable No acute issues   HTN Now on comfort directed medications only   BPH As needed bladder scans Flomax    Discharge diagnosis     Principal Problem:   GBS (Guillain Barre syndrome) (HCC) Active Problems:   COPD (chronic obstructive pulmonary disease) (HCC)   Essential hypertension   Protein-calorie malnutrition, severe    Discharge instructions    Discharge Instructions     Discharge instructions   Complete by: As directed    Disposition.  Residential hospice/ SNF-Hospice Condition.  Guarded CODE STATUS.  DNR Activity.  With assistance as tolerated, full fall precautions. Diet.  Soft  with feeding assistance and aspiration precautions. Goal of care.  Comfort.       Discharge Medications   Allergies as of 08/29/2023   No Active Allergies     Major procedures and Radiology Reports - PLEASE review detailed and final reports thoroughly  -       DG Chest Port 1 View Result Date: 08/06/2023 CLINICAL DATA:  Shortness of breath. EXAM: PORTABLE CHEST 1 VIEW COMPARISON:  08/01/2023 FINDINGS: The lungs are clear without focal pneumonia, edema, pneumothorax or pleural effusion. The cardiopericardial silhouette  is within normal limits for size. No acute bony abnormality. Telemetry leads overlie the chest. IMPRESSION: No active disease. Electronically Signed   By: Peter Stone M.D.   On: 08/06/2023 12:15   ECHOCARDIOGRAM COMPLETE Result Date: 08/04/2023    ECHOCARDIOGRAM REPORT   Patient Name:   Peter Stone Date of Exam: 08/04/2023 Medical Rec #:  [id number]        Height:       70.0 in Accession #:    [id number]       Weight:       139.6 lb Date of Birth:  [birth year]        BSA:          1.791 m Patient Age:    83 years         BP:           119/66 mmHg Patient Gender: M                HR:           92 bpm. Exam Location:  Inpatient Procedure: 2D Echo, Cardiac Doppler and Color Doppler (Both Spectral and Color            Flow Doppler were utilized during procedure). Indications:    R06.02 SOB  History:        Patient has no prior history of Echocardiogram examinations.                 COPD; Risk Factors:Hypertension.  Sonographer:    Maxwell Marion Referring Phys: Sharla Kidney Werner Lean GHIMIRE IMPRESSIONS  1. Respirophasic septal motion which can been seen with COPD, obesity, and pericardial disease. Left ventricular ejection fraction, by estimation, is 55 to 60%. The left ventricle has normal function. The left ventricle has no regional wall motion abnormalities. Left ventricular diastolic parameters are consistent with Grade I diastolic dysfunction (impaired relaxation).  2. Right ventricular systolic function is normal. The right ventricular size is normal.  3. The mitral valve is normal in structure. No evidence of mitral valve regurgitation. No evidence of mitral stenosis. Moderate mitral annular calcification.  4. The aortic valve is normal in structure. Aortic valve regurgitation is not visualized. No aortic stenosis is present.  5. The inferior vena cava is normal in size with greater than 50% respiratory variability, suggesting right atrial pressure of 3 mmHg. FINDINGS  Left Ventricle: Respirophasic septal motion which  can been seen with COPD, obesity, and pericardial disease. Left ventricular ejection fraction, by estimation, is 55 to 60%. The left ventricle has normal function. The left ventricle has no regional wall  motion abnormalities. Definity contrast agent was given IV to delineate the left ventricular endocardial borders. The left ventricular internal cavity size was normal in size. There is no left ventricular hypertrophy. Left ventricular diastolic parameters are consistent with Grade I diastolic dysfunction (impaired relaxation). Right Ventricle: The right ventricular size is normal. No increase in right ventricular wall thickness.  Right ventricular systolic function is normal. Left Atrium: Left atrial size was normal in size. Right Atrium: Right atrial size was normal in size. Pericardium: There is no evidence of pericardial effusion. Mitral Valve: The mitral valve is normal in structure. Moderate mitral annular calcification. No evidence of mitral valve regurgitation. No evidence of mitral valve stenosis. Tricuspid Valve: The tricuspid valve is normal in structure. Tricuspid valve regurgitation is not demonstrated. No evidence of tricuspid stenosis. Aortic Valve: The aortic valve is normal in structure. Aortic valve regurgitation is not visualized. No aortic stenosis is present. Pulmonic Valve: The pulmonic valve was normal in structure. Pulmonic valve regurgitation is not visualized. No evidence of pulmonic stenosis. Aorta: The aortic root is normal in size and structure. Venous: The inferior vena cava is normal in size with greater than 50% respiratory variability, suggesting right atrial pressure of 3 mmHg. IAS/Shunts: No atrial level shunt detected by color flow Doppler. Clearnce Hasten Electronically signed by Clearnce Hasten Signature Date/Time: 08/04/2023/9:55:47 AM    Final    DG FL GUIDED LUMBAR PUNCTURE Result Date: 08/02/2023 CLINICAL DATA:  83 year old male with progressive weakness of bilateral lower  extremities for the past few months following a viral infection in December. Now with weakness in bilateral upper extremities; concerning for Guillain-Barrre syndrome. Request for lumbar puncture. EXAM: LUMBAR PUNCTURE UNDER FLUOROSCOPY PROCEDURE: An appropriate skin entry site was determined fluoroscopically. Operator donned sterile gloves and mask. Skin site was marked, then prepped with Betadine, draped in usual sterile fashion, and infiltrated locally with 1% lidocaine. A 20 gauge spinal needle advanced into the thecal sac at L4-5 from a right interlaminar approach. Clear colorless CSF spontaneously returned, with opening pressure of 20 cm water. 10 ml CSF were collected and divided among 4 sterile vials for the requested laboratory studies. The needle was then removed. The patient tolerated the procedure well and there were no complications. FLUOROSCOPY: Radiation Exposure Index (as provided by the fluoroscopic device): 8.8 mGy Kerma IMPRESSION: Technically successful lumbar puncture under fluoroscopy. This exam was performed by Sherlene Shams, PA-C, and was supervised and interpreted by Dr. Mosie Epstein. Electronically Signed   By: Gilmer Mor D.O.   On: 08/02/2023 14:00   MR THORACIC SPINE W WO CONTRAST Result Date: 08/01/2023 CLINICAL DATA:  Lumbar myelopathy. EXAM: MRI THORACIC WITHOUT AND WITH CONTRAST TECHNIQUE: Multiplanar and multiecho pulse sequences of the thoracic spine were obtained without and with intravenous contrast. CONTRAST:  8mL GADAVIST GADOBUTROL 1 MMOL/ML IV SOLN COMPARISON:  Lumbar spine MRI 07/29/2018 FINDINGS: Alignment:  Physiologic. Vertebrae: No fracture, evidence of discitis, or bone lesion. Cord: Normal signal and morphology. Contrast enhancement of the visualized ventral nerve roots of the cauda equina. Paraspinal and other soft tissues: Left basilar atelectasis. Disc levels: T7-8: Small central disc protrusion without spinal canal stenosis. The other disc levels are  unremarkable. IMPRESSION: 1. Contrast enhancement of the visualized ventral nerve roots of the cauda equina. As previously described this can be seen in the setting of Guillain-Barre syndrome. 2. Small central disc protrusion at T7-8 without spinal canal stenosis. Electronically Signed   By: Deatra Robinson M.D.   On: 08/01/2023 23:42   MR BRAIN W WO CONTRAST Result Date: 08/01/2023 CLINICAL DATA:  Altered mental status EXAM: MRI HEAD WITHOUT AND WITH CONTRAST TECHNIQUE: Multiplanar, multiecho pulse sequences of the brain and surrounding structures were obtained without and with intravenous contrast. CONTRAST:  8mL GADAVIST GADOBUTROL 1 MMOL/ML IV SOLN COMPARISON:  04/07/2021 FINDINGS: Brain: No acute infarct, mass effect or extra-axial  collection. No acute or chronic hemorrhage. There is multifocal hyperintense T2-weighted signal within the white matter. Parenchymal volume and CSF spaces are normal. The midline structures are normal. There is no abnormal contrast enhancement. Vascular: Normal flow voids. Skull and upper cervical spine: Normal calvarium and skull base. Visualized upper cervical spine and soft tissues are normal. Sinuses/Orbits:No paranasal sinus fluid levels or advanced mucosal thickening. No mastoid or middle ear effusion. Normal orbits. IMPRESSION: 1. No acute intracranial abnormality. 2. Findings of chronic small vessel ischemia. Electronically Signed   By: Deatra Robinson M.D.   On: 08/01/2023 23:31   DG Chest Port 1 View Result Date: 08/01/2023 CLINICAL DATA:  Generalized weakness and dehydration EXAM: PORTABLE CHEST 1 VIEW COMPARISON:  Radiograph 06/23/2023 FINDINGS: The heart size and mediastinal contours are within normal limits. Both lungs are clear. The visualized skeletal structures are unremarkable. IMPRESSION: No active disease. Electronically Signed   By: Minerva Fester M.D.   On: 08/01/2023 21:19   DG Abd 1 View Result Date: 08/01/2023 CLINICAL DATA:  Generalized weakness,  dehydration, left leg cramping, poor intake and frequent urine is a shin EXAM: ABDOMEN - 1 VIEW COMPARISON:  Radiograph 02/27/2015 FINDINGS: Large colonic stool burden. No evidence of bowel obstruction. No acute osseous abnormality. IMPRESSION: Large colonic stool burden.  No evidence of obstruction. Electronically Signed   By: Minerva Fester M.D.   On: 08/01/2023 21:19   MR CERVICAL SPINE W WO CONTRAST Result Date: 07/31/2023 CLINICAL DATA:  Progressive bilateral upper and lower extremity weakness EXAM: MRI CERVICAL SPINE WITHOUT AND WITH CONTRAST TECHNIQUE: Multiplanar and multiecho pulse sequences of the cervical spine, to include the craniocervical junction and cervicothoracic junction, were obtained without and with intravenous contrast. CONTRAST:  7.74mL GADAVIST GADOBUTROL 1 MMOL/ML IV SOLN COMPARISON:  Radiographs 02/23/2015 FINDINGS: Despite efforts by the technologist and patient, prominent motion artifact is present on today's exam and could not be eliminated. This reduces exam sensitivity and specificity. Alignment: 1.5 mm degenerative retrolisthesis at C3-4. Vertebrae: Loss of disc height along with disc desiccation in the cervical spine most notable at C3-4 and C4-5 with mild degenerative endplate findings. Cord: Grossly unremarkable subject to limitations of assessment related to motion artifact. Posterior Fossa, vertebral arteries, paraspinal tissues: Unremarkable Disc levels: C2-3: Mild to moderate bilateral foraminal stenosis due to uncinate and facet spurring. C3-4: Prominent left and moderate to prominent right foraminal stenosis and mild central narrowing of the thecal sac due to disc bulge, uncinate spurring, and facet arthropathy. C4-5: Prominent bilateral foraminal stenosis due to uncinate and facet spurring. C5-6: No impingement.  Bilateral degenerative facet arthropathy. C6-7: Mild right and borderline left foraminal stenosis due to uncinate and facet spurring. C7-T1: Unremarkable  IMPRESSION: 1. Cervical spondylosis and degenerative disc disease causing prominent impingement at C3-4 and C4-5; mild to moderate impingement at C2-3; and mild impingement at C6-7, as detailed above. 2. No cord signal abnormality is identified, but assessment is limited by motion artifact. Electronically Signed   By: Gaylyn Rong M.D.   On: 07/31/2023 13:25   MR Lumbar Spine W Wo Contrast Result Date: 07/31/2023 CLINICAL DATA:  Upper and lower extremity weakness EXAM: MRI LUMBAR SPINE WITHOUT AND WITH CONTRAST TECHNIQUE: Multiplanar and multiecho pulse sequences of the lumbar spine were obtained without and with intravenous contrast. CONTRAST:  7.12mL GADAVIST GADOBUTROL 1 MMOL/ML IV SOLN COMPARISON:  Radiographs 06/16/2023 FINDINGS: Segmentation: The lowest lumbar type non-rib-bearing vertebra is labeled as L5. Alignment: 3 mm degenerative anterolisthesis at L5-S1. Mild dextroconvex scoliosis with rotary  component. Vertebrae: Striking loss of intervertebral disc height throughout the lumbar spine although less severe at L1-2. Schmorl's nodes at the L1-2 level. Type 2 degenerative endplate findings at L1-2, L4-5, and L5-S1. Conus medullaris and cauda equina: Conus extends to the T12-L1 level. Substantial abnormal ventral nerve root enhancement in the cauda equina. There is also some low-level leptomeningeal enhancement along the anterior margin of the conus on image 9 series 17. Guillain-Barre syndrome is the most likely cause of this pattern (although the left a meningeal enhancement is not atypical feature). The pattern can rarely be seen in some other settings including spinal cord infarct and tick-borne encephalopathy. Leptomeningeal metastatic disease seems less likely given how the dorsal nerve roots are universally spared. Paraspinal and other soft tissues: Unremarkable Disc levels: T12-L1: Unremarkable L1-2: Mild left subarticular lateral recess stenosis and mild displacement of the right L1 nerve  in the lateral extraforaminal space due to right lateral extraforaminal disc protrusion along with disc osteophyte complex and facet arthropathy. L2-3: Mild left subarticular lateral recess stenosis due to disc osteophyte complex and facet arthropathy. L3-4: Mild left subarticular lateral recess stenosis and mild right foraminal stenosis due to disc osteophyte complex and facet arthropathy. L4-5: No impingement.  Intervertebral and facet spurring noted. L5-S1: Borderline bilateral foraminal stenosis due to facet spurring. IMPRESSION: 1. Substantial abnormal ventral nerve root enhancement in the cauda equina, with some low-level leptomeningeal enhancement along the anterior margin of the conus. Guillain-Barre syndrome is the most likely cause of this pattern. The pattern can rarely be seen in some other settings including spinal cord infarct and tick-borne encephalopathy. Leptomeningeal metastatic disease seems less likely given how the dorsal nerve roots are spared. 2. Lumbar spondylosis and degenerative disc disease, causing mild impingement at L1-2, L2-3, and L3-4. 3. Mild dextroconvex scoliosis with rotary component. Electronically Signed   By: Gaylyn Rong M.D.   On: 07/31/2023 13:19   NCV with EMG(electromyography) Result Date: 07/31/2023 Antony Madura, MD     08/01/2023 10:02 AM [facility] Neurology [address] [place], [address]  [place], [place] [address] Tel: [phone] Fax: [phone] Test Date:  07/31/2023 Patient: Peter Stone DOB: [birth year] Physician: Peter Balint, MD Sex: Male Height: 5\' 10"  Ref Phys: Peter Balint, MD ID#: [id number]   Technician:  History: This is an 83 year old male with progressive weakness. NCV & EMG Findings: Extensive electrodiagnostic evaluation of the left upper and lower limbs with needle examination of thoracic paraspinal muscles shows: Left sural and superficial peroneal/fibular sensory responses are absent. Left median, ulnar, and radial sensory responses are  within normal limits. Left peroneal/fibular (EDB), peroneal/fibular (TA), tibial (AH), median (APB), and ulnar (ADM) motor responses show reduced amplitudes (EDB 1.03 mV, TA 1.40 mV, AH 3.5 mV, APB 4.8 mV, ADM 6.6 mV). Conduction velocities are normal. Left H reflex is absent. Chronic motor axon loss changes WITH accompanying active denervation changes are seen in left tibialis anterior and pronator teres. Active denervation changes without any observable motor units are seen in left medial head of gastrocnemius, short head of biceps femoris, rectus femoris, and gluteus medius, Chronic motor axon loss changes WITHOUT definitive accompanying active denervation changes are seen in left first dorsal interosseous, extensor indicis proprius, biceps, deltoid, and thoracic paraspinal (T9 level muscles (which patient could not relax). Patient could not tolerate further paraspinal muscle testing due to discomfort. Fasciculations are seen in at least 3 of 12 tested muscles. Impression: This is an abnormal study. The findings are most consistent with the  following: There is active/ongoing denervation with reinnervation in all muscles evaluated in the lower extremity with mostly chronic changes seen in the upper extremity. This could be representative of a wide spread, non-length dependent neuropathy given absent lower extremity sensory responses. Alternatively, absent lower extremity sensory responses may be due to age, which would make changes more consistent with polyradiculopathy or an evolving disorder of anterior horn cells (ie: motor neuron disease) among other possibilities. There is no definitive evidence of a demyelinating neuropathy. No electrodiagnostic evidence of myopathy. ___________________________ Peter Balint, MD Nerve Conduction Studies Motor Nerve Results   Latency Amplitude F-Lat Segment Distance CV Comment Site (ms) Norm (mV) Norm (ms)  (cm) (m/s) Norm  Left Fibular (EDB) Motor Ankle 4.5  < 6.0 *1.03  > 2.5        Bel fib head *NR - *NR -  Bel fib head-Ankle - *NR  > 40  Pop fossa *NR - *NR -  Pop fossa-Bel fib head - *NR -  Left Fibular (TA) Motor Fib head 2.9  < 4.5 *1.40  > 3.0       Pop fossa 5.2  < 6.7 1.34 -  Pop fossa-Fib head 10 43  > 40  Left Median (APB) Motor Wrist 2.7  < 4.0 *4.8  > 5.0       Elbow 8.4 - 4.4 -  Elbow-Wrist 28.5 50  > 50  Left Tibial (AH) Motor Ankle 5.5  < 6.0 *3.5  > 4.0       Knee 15.5 - 1.52 -  Knee-Ankle 44 44  > 40  Left Ulnar (ADM) Motor Wrist 2.2  < 3.1 *6.6  > 7.0       Bel elbow 6.4 - 4.5 -  Bel elbow-Wrist 22 52  > 50  Ab elbow 8.3 - 3.8 -  Ab elbow-Bel elbow 10 53 -  Sensory Sites   Neg Peak Lat Amplitude (O-P) Segment Distance Velocity Comment Site (ms) Norm (V) Norm  (cm) (ms)  Left Median Sensory Wrist-Dig II 3.1  < 3.8 15  > 10 Wrist-Dig II 13   Left Radial Sensory Forearm-Wrist 2.5  < 2.8 13  > 10 Forearm-Wrist 10   Left Superficial Fibular Sensory 14 cm-Ankle *NR  < 4.6 *NR  > 3 14 cm-Ankle 14   Left Sural Sensory Calf-Lat mall *NR  < 4.6 *NR  > 3 Calf-Lat mall 14   Left Ulnar Sensory Wrist-Dig V 2.9  < 3.2 6  > 5 Wrist-Dig V 11   H-Reflex Results   M-Lat H Lat H Neg Amp H-M Lat Site (ms) (ms) Norm (mV) (ms) Left Tibial H-Reflex Pop fossa 7.9 -  < 35.0 - - Electromyography  Side Muscle Ins.Act Fibs Fasc Recrt Amp Dur Poly Activation Comment Left FDI Nml Nml *1+ *3- *1+ *1+ *1+ *1- N/A Left EIP Nml Nml *1+ *2- *1+ *1+ *1+ *1- N/A Left Pronator teres Nml *2+ Nml *2- *1+ *1+ *1+ *1- N/A Left Biceps Nml Nml *1+ *3- *1+ *1+ *1+ *1- N/A Left Triceps Nml Nml Nml *None *- *- *- *2- N/A Left Deltoid Nml Nml Nml *2- Nml Nml Nml *2- N/A Left Tib ant Nml *2+ Nml *3- *1+ *1+ *1+ *2- N/A Left Gastroc MH Nml *1+ Nml *None *- *- *- *None N/A Left Rectus fem Nml *2+ Nml *None *- *- *- *None N/A Left Biceps fem SH Nml *2+ Nml *None *- *- *- *None N/A Left T9 PSP Nml Nml Nml *1- *1+ *1+ *  1+ Nml Unable to relax Left Gluteus med Nml *1+ Nml *None *- *- *- *None N/A Waveforms: Motor          Sensory         H-Reflex     Micro Results    Recent Results (from the past 240 hours)  Resp panel by RT-PCR (RSV, Flu A&B, Covid) Anterior Nasal Swab     Status: None   Collection Time: 08/01/23  1:51 PM   Specimen: Anterior Nasal Swab  Result Value Ref Range Status   SARS Coronavirus 2 by RT PCR NEGATIVE NEGATIVE Final   Influenza A by PCR NEGATIVE NEGATIVE Final   Influenza B by PCR NEGATIVE NEGATIVE Final    Comment: (NOTE) The Xpert Xpress SARS-CoV-2/FLU/RSV plus assay is intended as an aid in the diagnosis of influenza from Nasopharyngeal swab specimens and should not be used as a sole basis for treatment. Nasal washings and aspirates are unacceptable for Xpert Xpress SARS-CoV-2/FLU/RSV testing.  Fact Sheet for Patients: BloggerCourse.com  Fact Sheet for Healthcare Providers: SeriousBroker.it  This test is not yet approved or cleared by the [place] FDA and has been authorized for detection and/or diagnosis of SARS-CoV-2 by FDA under an Emergency Use Authorization (EUA). This EUA will remain in effect (meaning this test can be used) for the duration of the COVID-19 declaration under Section 564(b)(1) of the Act, 21 U.S.C. section 360bbb-3(b)(1), unless the authorization is terminated or revoked.     Resp Syncytial Virus by PCR NEGATIVE NEGATIVE Final    Comment: (NOTE) Fact Sheet for Patients: BloggerCourse.com  Fact Sheet for Healthcare Providers: SeriousBroker.it  This test is not yet approved or cleared by the [place] FDA and has been authorized for detection and/or diagnosis of SARS-CoV-2 by FDA under an Emergency Use Authorization (EUA). This EUA will remain in effect (meaning this test can be used) for the duration of the COVID-19 declaration under Section 564(b)(1) of the Act, 21 U.S.C. section 360bbb-3(b)(1), unless the authorization is terminated  or revoked.  Performed at [facility], [address], [place], [place] [address]   VZV PCR, CSF     Status: None   Collection Time: 08/02/23 12:59 PM   Specimen: Lumbar Puncture; Cerebrospinal Fluid  Result Value Ref Range Status   VZV PCR, CSF Negative Negative Final    Comment: (NOTE) No Varicella Zoster Virus DNA detected. Performed At: [facility] [address] [place], [place] [id number] Jolene Schimke MD XB:[id number]   CSF culture w Gram Stain     Status: None   Collection Time: 08/02/23 12:59 PM   Specimen: PATH Cytology CSF; Cerebrospinal Fluid  Result Value Ref Range Status   Specimen Description CSF  Final   Special Requests NONE  Final   Gram Stain   Final    WBC PRESENT, PREDOMINANTLY MONONUCLEAR NO ORGANISMS SEEN CYTOSPIN SMEAR    Culture   Final    NO GROWTH 3 DAYS Performed at [facility], [address] [place], [place] [address]    Report Status 08/05/2023 FINAL  Final    Today   Subjective    Peter Stone today has no headache,no chest abdominal pain,no new weakness tingling or numbness.   Objective   Blood pressure 104/60, pulse (!) 113, temperature 99.3 F (37.4 C), temperature source Axillary, resp. rate (!) 32, height 5\' 10"  (1.778 m), weight 61.4 kg, SpO2 (!) 68%.   Intake/Output Summary (Last 24 hours) at 08/22/2023 0543 Last data filed  at 08/12/2023 0552 Gross per 24 hour  Intake --  Output 275 ml  Net -275 ml    Exam    Extremely frail, weak elderly gentleman, laying in bed, strength 2/5 in all 4 extremities, extremely weak cough reflex Supple Neck,   Symmetrical Chest wall movement, Good air movement bilaterally, CTAB RRR,No Gallops,   +ve B.Sounds, Abd Soft, Non tender,  No Cyanosis, Clubbing or edema    Data Review   Recent Labs  Lab 08/05/23 0602 08/06/23 0555 08/07/23 0426 08/08/23 0429 08/09/23 0436  WBC 6.0 4.7 4.7 5.6 8.8  HGB 12.5* 12.4* 12.0* 12.1* 13.0  HCT 36.3* 37.1* 35.6*  36.7* 39.3  PLT 217 223 216 218 218  MCV 94.3 95.6 95.2 95.1 95.4  MCH 32.5 32.0 32.1 31.3 31.6  MCHC 34.4 33.4 33.7 33.0 33.1  RDW 15.5 15.3 15.3 15.3 15.5  LYMPHSABS  --  1.0 0.9 0.9 0.7  MONOABS  --  0.8 0.8 0.9 1.0  EOSABS  --  0.2 0.3 0.2 0.0  BASOSABS  --  0.0 0.0 0.0 0.0    Recent Labs  Lab 08/05/23 0602 08/06/23 0505 08/06/23 0555 08/07/23 0426 08/08/23 0429 08/09/23 0436  NA 140  --  141 140 139 139  K 3.6  --  3.7 3.7 3.4* 3.9  CL 110  --  109 109 107 108  CO2 24  --  26 24 24 23   ANIONGAP 6  --  6 7 8 8   GLUCOSE 115*  --  107* 112* 106* 117*  BUN 28*  --  27* 28* 35* 34*  CREATININE 0.59*  --  0.63 0.51* 0.63 0.60*  AST  --   --   --  40 64* 63*  ALT  --   --   --  38 57* 61*  ALKPHOS  --   --   --  89 85 92  BILITOT  --   --   --  0.6 0.6 0.7  ALBUMIN  --   --   --  2.2* 2.3* 2.5*  TSH  --  0.625  --   --   --   --   BNP  --   --  41.2 37.5 39.2 42.0  MG  --   --  2.0 2.1 2.0 2.1  PHOS  --   --  2.9 3.1 3.4 4.0  CALCIUM 8.8*  --  8.8* 8.6* 8.7* 8.8*    Total Time in preparing paper work, data evaluation and todays exam - 35 minutes  Signature  -    Peter Stone M.D on 08/17/2023 at 5:43 AM   -  To page go to [email or web address]

## 2023-08-31 DEATH — deceased

## 2023-10-05 ENCOUNTER — Ambulatory Visit: Admitting: Internal Medicine

## 2023-10-09 IMAGING — MR MR HEAD WO/W CM
11 of 12 series · 37 of 48 positions shown · IV contrast (multihance)
Comparison: No pertinent prior exams available for comparison.

CLINICAL DATA: Asymmetrical sensorineural hearing loss. Additional
history provided by scanning technologist: Patient reports hearing
loss, hearing "engine noise," symptoms for 10 years.

EXAM:
MRI HEAD WITHOUT AND WITH CONTRAST
TECHNIQUE: Multiplanar, multiecho pulse sequences of the brain and surrounding
structures were obtained without and with intravenous contrast.
CONTRAST:  16mL MULTIHANCE GADOBENATE DIMEGLUMINE 529 MG/ML IV SOLN

[Series 2: T1 · sagittal · 5.0mm · 0.45mm/px · 3 of 21 slices shown (1 of 4)]
[im 1/21]
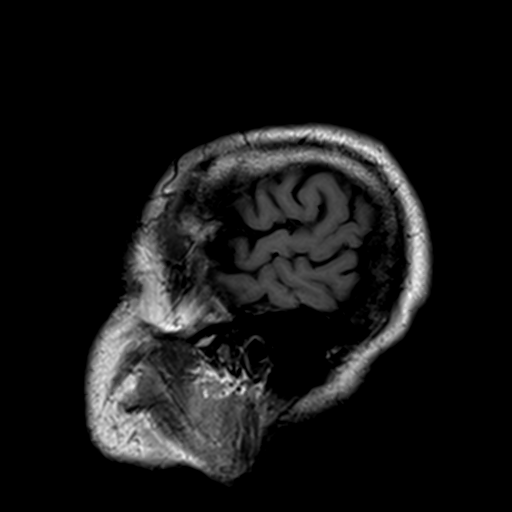
[im 11/21]
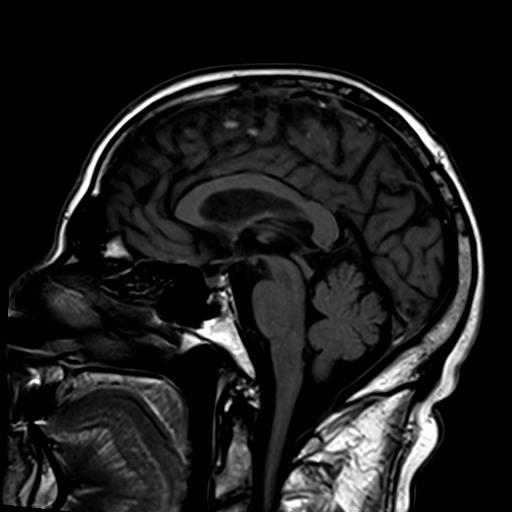
[im 21/21]
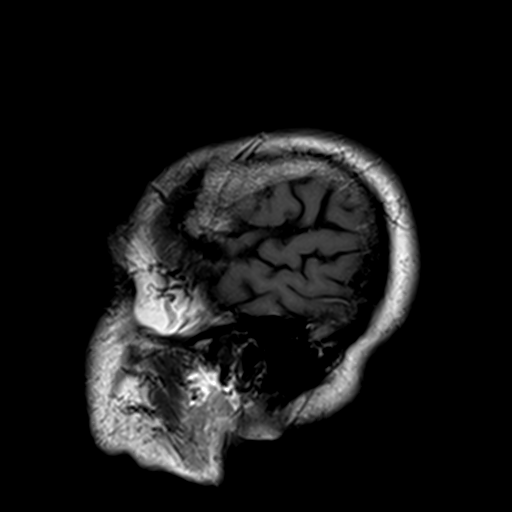

[Series 3: ep2d_diff_3 · axial · 3.0mm · 1.80mm/px · z∈[-57,+89]mm · 8 of 98 slices shown]
[im 1/98]
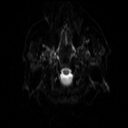
[im 11/98]
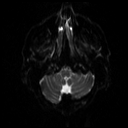
[im 33/98]
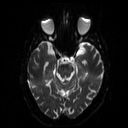
[im 44/98]
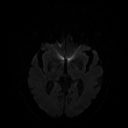
[im 54/98]
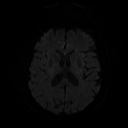
[im 65/98]
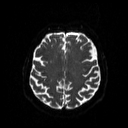
[im 87/98]
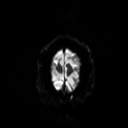
[im 98/98]
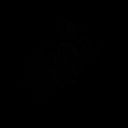

[Series 4: ep2d_diff_3_adc · axial · 3.0mm · 1.80mm/px · z∈[-57,+89]mm · 5 of 49 slices shown]
[im 1/49]
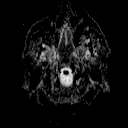
[im 13/49]
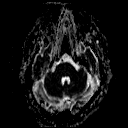
[im 25/49]
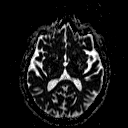
[im 37/49]
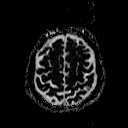
[im 49/49]
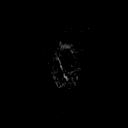

[Series 5: t2_tse_tra_512 · axial · 5.0mm · 0.60mm/px · z∈[-53,+90]mm · 2 of 24 slices shown]
[im 1/24]
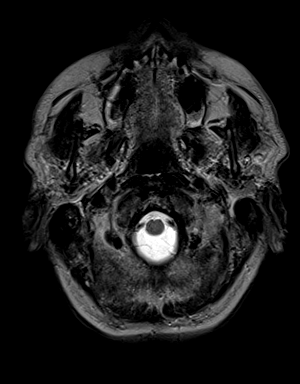
[im 24/24]
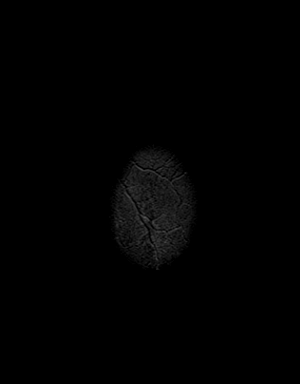

[Series 6: FLAIR · axial · 3.0mm · 0.43mm/px · z∈[-61,+94]mm · 3 of 27 slices shown]
[im 1/27]
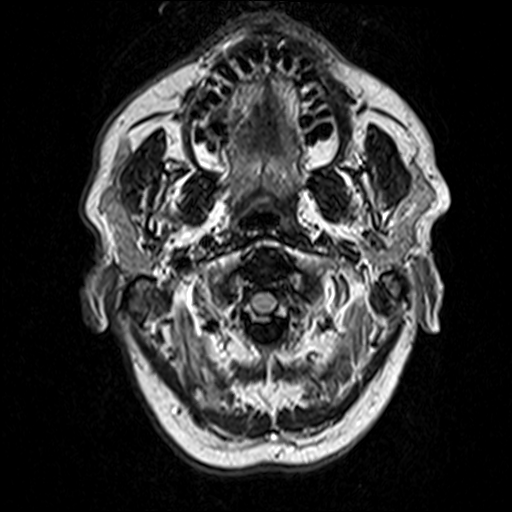
[im 14/27]
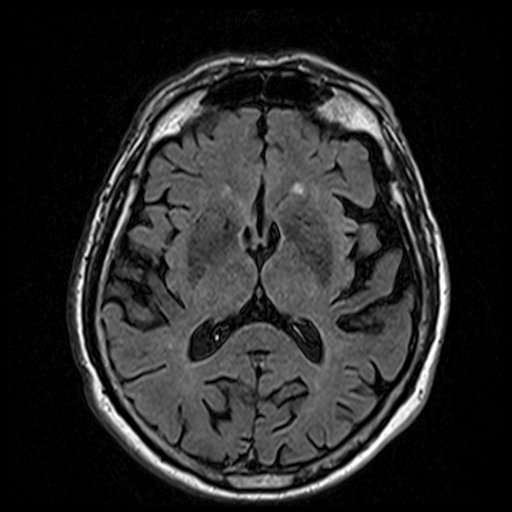
[im 27/27]
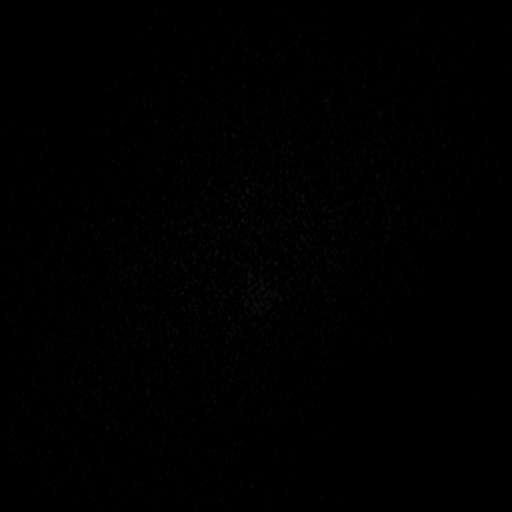

[Series 8: swi_images · axial · 2.0mm · 0.90mm/px · z∈[-60,+97]mm · 8 of 80 slices shown]
[im 1/80]
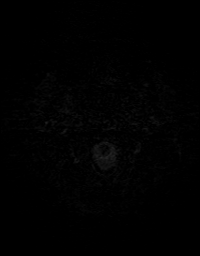
[im 12/80]
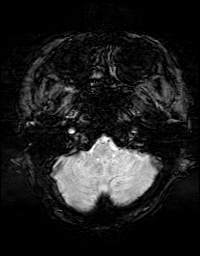
[im 23/80]
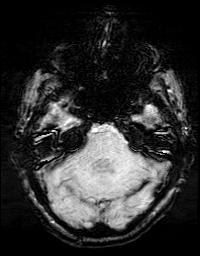
[im 34/80]
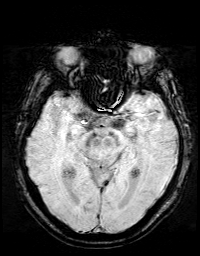
[im 46/80]
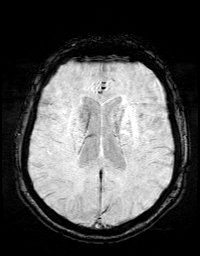
[im 57/80]
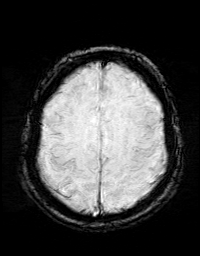
[im 68/80]
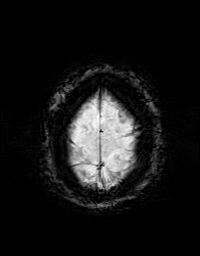
[im 80/80]
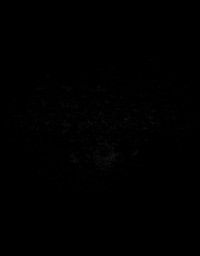

[Series 9: T1 · coronal · 3.0mm · 0.35mm/px · 1 of 11 slices shown (2 of 4)]
[im 1/11]
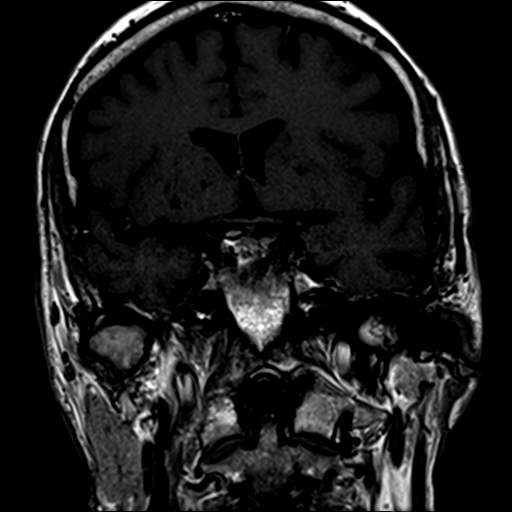

[Series 10: T1 · axial · 3.0mm · 0.35mm/px · 1 of 11 slices shown (3 of 4)]
[im 1/11]
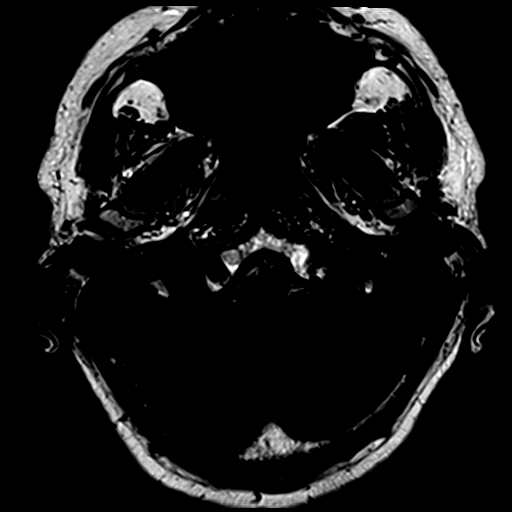

[Series 11: bSSFP · axial · 0.7mm · 0.28mm/px · z∈[-46,-24]mm · 4 of 44 slices shown]
[im 1/44]
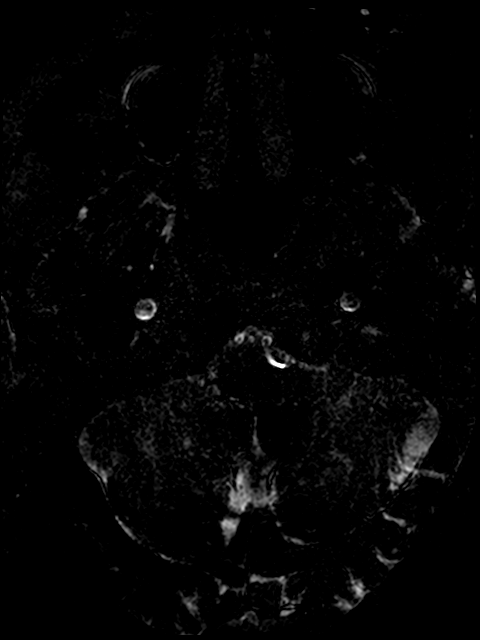
[im 11/44]
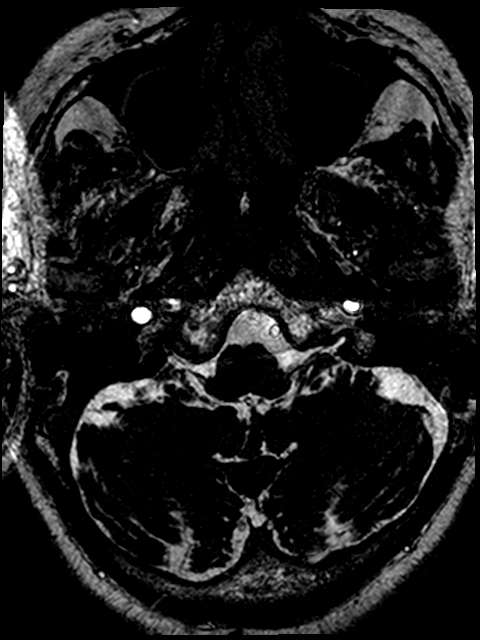
[im 22/44]
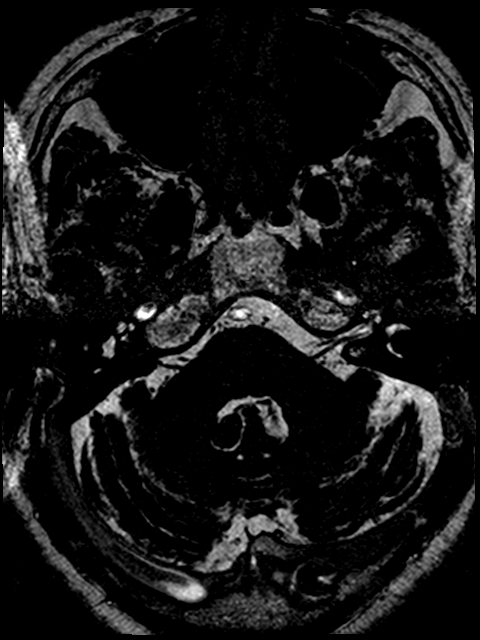
[im 33/44]
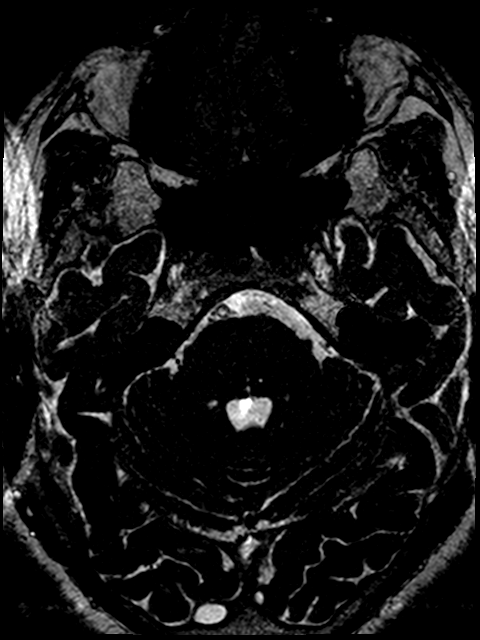

[Series 12: T1 · coronal · 3.0mm · 0.35mm/px · 1 of 11 slices shown (4 of 4)]
[im 1/11]
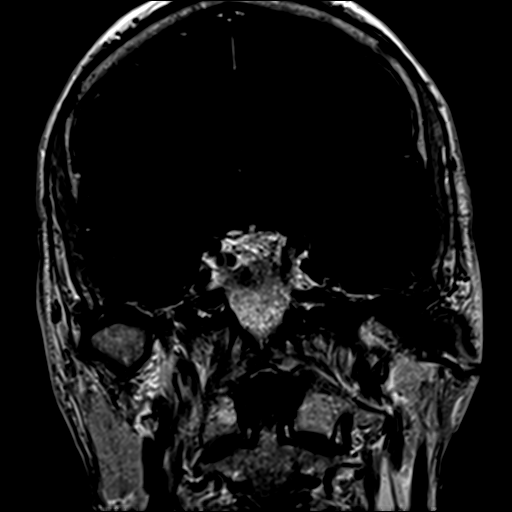

[Series 13: T1 post-contrast · axial · 3.0mm · 0.35mm/px · 1 of 11 slices shown]
[im 1/11]
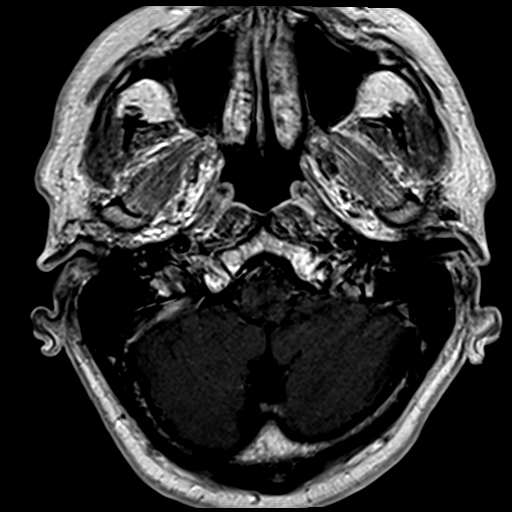

[37 of 48 positions shown; findings below may reference images not displayed]

FINDINGS: Brain:

Cerebral volume appears normal for age.

Mild multifocal T2 FLAIR hyperintense signal abnormality within the
cerebral white matter, nonspecific but compatible with chronic small
vessel ischemic disease.

No evidence of an intracranial mass. Specifically, no
cerebellopontine angle or internal auditory canal mass is
demonstrated. Unremarkable appearance of the 7th and 8th cranial
nerves bilaterally.

There is no acute infarct.

No chronic intracranial blood products.

No extra-axial fluid collection.

No midline shift.

No pathologic intracranial enhancement identified.

Vascular: Maintained flow voids within the proximal large arterial
vessels.

Skull and upper cervical spine: No focal suspicious marrow lesion.

Sinuses/Orbits: Visualized orbits show no acute finding. Bilateral
lens replacements. Minimal mucosal thickening within the bilateral
ethmoid and right maxillary sinuses.

Other: Trace fluid within the left mastoid air cells.
IMPRESSION: No evidence of acute intracranial abnormality.

No cerebellopontine angle or internal auditory canal mass.

Mild chronic small vessel ischemic changes within the cerebral white
matter.

Minimal mucosal thickening within the bilateral ethmoid and right
maxillary sinuses.

Trace fluid within left mastoid air cells.
